# Patient Record
Sex: Female | Born: 1935 | Race: Black or African American | Hispanic: No | Marital: Married | Smoking: Never smoker
Health system: Southern US, Community
[De-identification: ages and names within clinical notes are randomized; demographics above are authoritative.]

## PROBLEM LIST (undated history)

## (undated) DIAGNOSIS — E119 Type 2 diabetes mellitus without complications: Secondary | ICD-10-CM

## (undated) DIAGNOSIS — M810 Age-related osteoporosis without current pathological fracture: Secondary | ICD-10-CM

## (undated) DIAGNOSIS — G709 Myoneural disorder, unspecified: Secondary | ICD-10-CM

## (undated) DIAGNOSIS — K219 Gastro-esophageal reflux disease without esophagitis: Secondary | ICD-10-CM

## (undated) DIAGNOSIS — I10 Essential (primary) hypertension: Secondary | ICD-10-CM

## (undated) DIAGNOSIS — H269 Unspecified cataract: Secondary | ICD-10-CM

## (undated) DIAGNOSIS — E7800 Pure hypercholesterolemia, unspecified: Secondary | ICD-10-CM

## (undated) DIAGNOSIS — D649 Anemia, unspecified: Secondary | ICD-10-CM

## (undated) DIAGNOSIS — E039 Hypothyroidism, unspecified: Secondary | ICD-10-CM

## (undated) DIAGNOSIS — M1990 Unspecified osteoarthritis, unspecified site: Secondary | ICD-10-CM

## (undated) HISTORY — DX: Hypothyroidism, unspecified: E03.9

## (undated) HISTORY — DX: Unspecified cataract: H26.9

## (undated) HISTORY — DX: Pure hypercholesterolemia, unspecified: E78.00

## (undated) HISTORY — DX: Unspecified osteoarthritis, unspecified site: M19.90

## (undated) HISTORY — DX: Type 2 diabetes mellitus without complications: E11.9

## (undated) HISTORY — PX: UPPER GASTROINTESTINAL ENDOSCOPY: SHX188

## (undated) HISTORY — PX: COLONOSCOPY: SHX174

## (undated) HISTORY — PX: CYST EXCISION: SHX5701

## (undated) HISTORY — DX: Essential (primary) hypertension: I10

## (undated) HISTORY — DX: Age-related osteoporosis without current pathological fracture: M81.0

## (undated) HISTORY — DX: Anemia, unspecified: D64.9

## (undated) HISTORY — DX: Myoneural disorder, unspecified: G70.9

---

## 1990-01-09 HISTORY — PX: THYROID SURGERY: SHX805

## 2004-06-09 ENCOUNTER — Ambulatory Visit: Payer: Self-pay | Admitting: Internal Medicine

## 2005-08-16 ENCOUNTER — Ambulatory Visit: Payer: Self-pay | Admitting: Internal Medicine

## 2006-06-29 ENCOUNTER — Ambulatory Visit: Payer: Self-pay | Admitting: Gastroenterology

## 2007-01-23 ENCOUNTER — Ambulatory Visit: Payer: Self-pay | Admitting: Internal Medicine

## 2008-01-31 ENCOUNTER — Ambulatory Visit: Payer: Self-pay | Admitting: Internal Medicine

## 2008-06-25 ENCOUNTER — Ambulatory Visit: Payer: Self-pay | Admitting: Gastroenterology

## 2009-04-12 ENCOUNTER — Ambulatory Visit: Payer: Self-pay | Admitting: Internal Medicine

## 2010-05-10 ENCOUNTER — Ambulatory Visit: Payer: Self-pay | Admitting: Internal Medicine

## 2010-12-05 ENCOUNTER — Ambulatory Visit: Payer: Self-pay | Admitting: Internal Medicine

## 2011-02-03 DIAGNOSIS — E042 Nontoxic multinodular goiter: Secondary | ICD-10-CM | POA: Diagnosis not present

## 2011-02-28 DIAGNOSIS — E041 Nontoxic single thyroid nodule: Secondary | ICD-10-CM | POA: Diagnosis not present

## 2011-03-21 DIAGNOSIS — E119 Type 2 diabetes mellitus without complications: Secondary | ICD-10-CM | POA: Diagnosis not present

## 2011-03-21 DIAGNOSIS — J069 Acute upper respiratory infection, unspecified: Secondary | ICD-10-CM | POA: Diagnosis not present

## 2011-03-21 DIAGNOSIS — E039 Hypothyroidism, unspecified: Secondary | ICD-10-CM | POA: Diagnosis not present

## 2011-03-21 DIAGNOSIS — R05 Cough: Secondary | ICD-10-CM | POA: Diagnosis not present

## 2011-03-21 DIAGNOSIS — E7800 Pure hypercholesterolemia, unspecified: Secondary | ICD-10-CM | POA: Diagnosis not present

## 2011-03-21 DIAGNOSIS — I10 Essential (primary) hypertension: Secondary | ICD-10-CM | POA: Diagnosis not present

## 2011-03-21 DIAGNOSIS — D649 Anemia, unspecified: Secondary | ICD-10-CM | POA: Diagnosis not present

## 2011-03-29 DIAGNOSIS — J209 Acute bronchitis, unspecified: Secondary | ICD-10-CM | POA: Diagnosis not present

## 2011-05-02 DIAGNOSIS — M79609 Pain in unspecified limb: Secondary | ICD-10-CM | POA: Diagnosis not present

## 2011-05-02 DIAGNOSIS — R209 Unspecified disturbances of skin sensation: Secondary | ICD-10-CM | POA: Diagnosis not present

## 2011-05-02 DIAGNOSIS — M25519 Pain in unspecified shoulder: Secondary | ICD-10-CM | POA: Diagnosis not present

## 2011-05-02 DIAGNOSIS — E119 Type 2 diabetes mellitus without complications: Secondary | ICD-10-CM | POA: Diagnosis not present

## 2011-05-02 DIAGNOSIS — I10 Essential (primary) hypertension: Secondary | ICD-10-CM | POA: Diagnosis not present

## 2011-05-02 DIAGNOSIS — E7800 Pure hypercholesterolemia, unspecified: Secondary | ICD-10-CM | POA: Diagnosis not present

## 2011-05-03 DIAGNOSIS — J301 Allergic rhinitis due to pollen: Secondary | ICD-10-CM | POA: Diagnosis not present

## 2011-05-03 DIAGNOSIS — R439 Unspecified disturbances of smell and taste: Secondary | ICD-10-CM | POA: Diagnosis not present

## 2011-05-24 DIAGNOSIS — R439 Unspecified disturbances of smell and taste: Secondary | ICD-10-CM | POA: Diagnosis not present

## 2011-08-29 ENCOUNTER — Ambulatory Visit: Payer: Self-pay | Admitting: Internal Medicine

## 2011-08-29 DIAGNOSIS — E042 Nontoxic multinodular goiter: Secondary | ICD-10-CM | POA: Diagnosis not present

## 2011-08-29 DIAGNOSIS — E041 Nontoxic single thyroid nodule: Secondary | ICD-10-CM | POA: Diagnosis not present

## 2011-09-05 DIAGNOSIS — E042 Nontoxic multinodular goiter: Secondary | ICD-10-CM | POA: Diagnosis not present

## 2011-12-21 DIAGNOSIS — E119 Type 2 diabetes mellitus without complications: Secondary | ICD-10-CM | POA: Diagnosis not present

## 2011-12-21 LAB — HM DIABETES EYE EXAM

## 2011-12-22 ENCOUNTER — Telehealth: Payer: Self-pay | Admitting: *Deleted

## 2011-12-22 MED ORDER — LISINOPRIL 10 MG PO TABS: 10.0000 mg | ORAL_TABLET | Freq: Every day | ORAL | Status: DC

## 2011-12-22 NOTE — Telephone Encounter (Signed)
Called prescription in to pharmacy 

## 2011-12-22 NOTE — Telephone Encounter (Signed)
Patient called requesting lisinopril 10 mg 1 tablet once a day. She only has two tablets left

## 2011-12-27 ENCOUNTER — Telehealth: Payer: Self-pay | Admitting: Internal Medicine

## 2011-12-27 NOTE — Telephone Encounter (Signed)
Call patient to have her call pharmacy they want diagnosis and other information for refill. I called patient to have her call pharmacy to send [place] over a refill request.

## 2011-12-27 NOTE — Telephone Encounter (Signed)
Cell# [phone] Pt came in today she has appointment Monday with dr scott Pt stated she is out of the following meds  accu check test strips [facility] [address]

## 2012-01-01 ENCOUNTER — Encounter: Payer: Self-pay | Admitting: Internal Medicine

## 2012-01-01 ENCOUNTER — Ambulatory Visit (INDEPENDENT_AMBULATORY_CARE_PROVIDER_SITE_OTHER): Payer: Medicare Other | Admitting: Internal Medicine

## 2012-01-01 ENCOUNTER — Other Ambulatory Visit (HOSPITAL_COMMUNITY)
Admission: RE | Admit: 2012-01-01 | Discharge: 2012-01-01 | Disposition: A | Discharge status: Home / Self Care | Payer: Medicare Other | Source: Ambulatory Visit | Attending: Internal Medicine | Admitting: Internal Medicine

## 2012-01-01 VITALS — BP 120/80 | HR 84 | Temp 98.6°F | Resp 16 | Wt 140.5 lb

## 2012-01-01 DIAGNOSIS — Z124 Encounter for screening for malignant neoplasm of cervix: Secondary | ICD-10-CM | POA: Diagnosis not present

## 2012-01-01 DIAGNOSIS — E039 Hypothyroidism, unspecified: Secondary | ICD-10-CM | POA: Insufficient documentation

## 2012-01-01 DIAGNOSIS — E119 Type 2 diabetes mellitus without complications: Secondary | ICD-10-CM

## 2012-01-01 DIAGNOSIS — E1165 Type 2 diabetes mellitus with hyperglycemia: Secondary | ICD-10-CM | POA: Insufficient documentation

## 2012-01-01 DIAGNOSIS — D649 Anemia, unspecified: Secondary | ICD-10-CM

## 2012-01-01 DIAGNOSIS — E7800 Pure hypercholesterolemia, unspecified: Secondary | ICD-10-CM | POA: Diagnosis not present

## 2012-01-01 DIAGNOSIS — Z139 Encounter for screening, unspecified: Secondary | ICD-10-CM

## 2012-01-01 DIAGNOSIS — I10 Essential (primary) hypertension: Secondary | ICD-10-CM | POA: Insufficient documentation

## 2012-01-01 DIAGNOSIS — M810 Age-related osteoporosis without current pathological fracture: Secondary | ICD-10-CM

## 2012-01-01 DIAGNOSIS — M8580 Other specified disorders of bone density and structure, unspecified site: Secondary | ICD-10-CM | POA: Insufficient documentation

## 2012-01-01 DIAGNOSIS — Z1151 Encounter for screening for human papillomavirus (HPV): Secondary | ICD-10-CM | POA: Insufficient documentation

## 2012-01-01 MED ORDER — LISINOPRIL 10 MG PO TABS: 10.0000 mg | ORAL_TABLET | Freq: Every day | ORAL | Status: DC

## 2012-01-01 MED ORDER — PRAVASTATIN SODIUM 40 MG PO TABS: 40.0000 mg | ORAL_TABLET | Freq: Every day | ORAL | Status: DC

## 2012-01-01 MED ORDER — BLOOD GLUCOSE TEST VI STRP: ORAL_STRIP | Status: DC

## 2012-01-01 NOTE — Telephone Encounter (Signed)
Patient was in office today and medication issue was taken care of.

## 2012-01-02 ENCOUNTER — Other Ambulatory Visit (INDEPENDENT_AMBULATORY_CARE_PROVIDER_SITE_OTHER): Payer: BC Managed Care – PPO

## 2012-01-02 ENCOUNTER — Telehealth: Payer: Self-pay | Admitting: Internal Medicine

## 2012-01-02 DIAGNOSIS — E7800 Pure hypercholesterolemia, unspecified: Secondary | ICD-10-CM

## 2012-01-02 DIAGNOSIS — M810 Age-related osteoporosis without current pathological fracture: Secondary | ICD-10-CM | POA: Diagnosis not present

## 2012-01-02 DIAGNOSIS — D649 Anemia, unspecified: Secondary | ICD-10-CM | POA: Diagnosis not present

## 2012-01-02 DIAGNOSIS — E119 Type 2 diabetes mellitus without complications: Secondary | ICD-10-CM | POA: Diagnosis not present

## 2012-01-02 DIAGNOSIS — Z139 Encounter for screening, unspecified: Secondary | ICD-10-CM

## 2012-01-02 LAB — BASIC METABOLIC PANEL
BUN: 15 mg/dL (ref 6–23)
Calcium: 9.2 mg/dL (ref 8.4–10.5)
Creatinine, Ser: 0.8 mg/dL (ref 0.4–1.2)

## 2012-01-02 LAB — CBC WITH DIFFERENTIAL/PLATELET
Basophils Relative: 1.2 % (ref 0.0–3.0)
Eosinophils Relative: 1.4 % (ref 0.0–5.0)
HCT: 34 % — ABNORMAL LOW (ref 36.0–46.0)
Hemoglobin: 10.8 g/dL — ABNORMAL LOW (ref 12.0–15.0)
Lymphs Abs: 1 10*3/uL (ref 0.7–4.0)
MCV: 78.5 fl (ref 78.0–100.0)
Monocytes Absolute: 0.7 10*3/uL (ref 0.1–1.0)
Neutro Abs: 4 10*3/uL (ref 1.4–7.7)
Platelets: 239 10*3/uL (ref 150.0–400.0)
WBC: 5.9 10*3/uL (ref 4.5–10.5)

## 2012-01-02 LAB — HEPATIC FUNCTION PANEL
ALT: 14 U/L (ref 0–35)
AST: 15 U/L (ref 0–37)
Albumin: 4 g/dL (ref 3.5–5.2)
Total Protein: 7.3 g/dL (ref 6.0–8.3)

## 2012-01-02 LAB — HEMOGLOBIN A1C: Hgb A1c MFr Bld: 6.7 % — ABNORMAL HIGH (ref 4.6–6.5)

## 2012-01-02 LAB — MICROALBUMIN / CREATININE URINE RATIO
Creatinine,U: 111 mg/dL
Microalb Creat Ratio: 1.4 mg/g (ref 0.0–30.0)

## 2012-01-02 LAB — FERRITIN: Ferritin: 146.7 ng/mL (ref 10.0–291.0)

## 2012-01-02 LAB — LIPID PANEL
Cholesterol: 177 mg/dL (ref 0–200)
Triglycerides: 59 mg/dL (ref 0.0–149.0)

## 2012-01-02 MED ORDER — GLIPIZIDE ER 5 MG PO TB24: 5.0000 mg | ORAL_TABLET | Freq: Every day | ORAL | Status: DC

## 2012-01-02 NOTE — Telephone Encounter (Signed)
Refill on Glipizide 5 mg. [facility]

## 2012-01-03 LAB — VITAMIN D 25 HYDROXY (VIT D DEFICIENCY, FRACTURES): Vit D, 25-Hydroxy: 37 ng/mL (ref 30–89)

## 2012-01-06 ENCOUNTER — Encounter: Payer: Self-pay | Admitting: Internal Medicine

## 2012-01-06 NOTE — Assessment & Plan Note (Signed)
Blood pressure under good control.  Same medication regimen.  Check metabolic panel.    

## 2012-01-06 NOTE — Assessment & Plan Note (Signed)
Bone density 12/27/09 normal.  Off Fosamax.  Continue calcium and vitamin D.

## 2012-01-06 NOTE — Progress Notes (Signed)
  Subjective:    [name], female    DOB: December 03, 1935, 76 y.o.   MRN: [id number]  HPI 76 year old female with past history diabetes, hypercholesterolemia, osteoporosis, hypertension and hypothyroidism who comes in today to follow up on these issues as well as for a complete physical exam.  She states she has been doing well.  She has seen Dr Renae Fickle.  Off thyroid medication now.  Brought in no recorded sugar readings.  States overall sugars under reasonable control.  No cardiac symptoms with increased activity or exertion.  Breathing stable.  No bowel change.    Past Medical History  Diagnosis Date  . Diabetes mellitus   . Hypothyroidism   . Hypercholesterolemia   . Osteoporosis   . Anemia   . Hypertension     Current Outpatient Prescriptions on File Prior to Visit  Medication Sig Dispense Refill  . calcium-vitamin D (CALCIUM 500+D) 500-200 MG-UNIT per tablet Take 1 tablet by mouth daily.      Marland Kitchen levothyroxine (SYNTHROID) 25 MCG tablet Take 25 mcg by mouth daily.      Marland Kitchen lisinopril (PRINIVIL,ZESTRIL) 10 MG tablet Take 1 tablet (10 mg total) by mouth daily.  90 tablet  3  . metFORMIN (GLUCOPHAGE) 500 MG tablet Two tablets bid      . omeprazole (PRILOSEC) 20 MG capsule Take 20 mg by mouth daily.      . pravastatin (PRAVACHOL) 40 MG tablet Take 1 tablet (40 mg total) by mouth daily.  90 tablet  3  . glipiZIDE (GLUCOTROL XL) 5 MG 24 hr tablet Take 1 tablet (5 mg total) by mouth daily.  90 tablet  1    Review of Systems Patient denies any headache, lightheadedness or dizziness.  No sinus or allergy symptoms.  No chest pain, tightness or palpitations.  No increased shortness of breath, cough or congestion.  No nausea or vomiting.  No abdominal pain or cramping.  No bowel change, such as diarrhea, constipation, BRBPR or melana.  No urine change.        Objective:   Physical Exam Filed Vitals:   01/01/12 1337  BP: 120/80  Pulse: 84  Temp: 98.6 F (37 C)  Resp: 16   Blood  pressure recheck:  59/20  76 year old female in no acute distress.   HEENT:  Nares- clear.  Oropharynx - without lesions. NECK:  Supple.  Nontender.  No audible bruit.  HEART:  Appears to be regular. LUNGS:  No crackles or wheezing audible.  Respirations even and unlabored.  RADIAL PULSE:  Equal bilaterally.    BREASTS:  No nipple discharge or nipple retraction present.  Could not appreciate any distinct nodules or axillary adenopathy.  ABDOMEN:  Soft, nontender.  Bowel sounds present and normal.  No audible abdominal bruit.  GU:  Normal external genitalia.  Vaginal vault without lesions.  Cervix identified.  Pap performed. Could not appreciate any adnexal masses or tenderness.   RECTAL:  Heme negative.   EXTREMITIES:  No increased edema present.  DP pulses palpable and equal bilaterally.           Assessment & Plan:  LOSS OF SMELL/TASTE.  Did not improve with treating infection.  Was referred to ENT.  Feels stable.  Desires no further w/up at this time.    HEALTH MAINTENANCE.  Physical today.  Colonoscopy 06/25/08 with two polyps and a lipoma.  Bone density 12/27/09 normal.  Schedule mammogram.  IFOB - today.

## 2012-01-06 NOTE — Assessment & Plan Note (Signed)
On Pravastatin.  Low cholesterol diet and exercise.  Check lipid panel and liver function.  

## 2012-01-06 NOTE — Assessment & Plan Note (Signed)
Iron deficient.  Hgb has been stable.  Colonoscopy 06/25/08 with two polyps and a lipoma.  EGD 2004.  Treated for H. Pylori.  Upper symptoms controlled.  Ferritin wnl.  Recheck cbc/ferritin.

## 2012-01-06 NOTE — Assessment & Plan Note (Addendum)
Continue diabetic diet and exercise.  Same medications.  No lows.  Follow.  Check blood sugars bid.  Check urine microalbumin/cr ration.

## 2012-01-06 NOTE — Assessment & Plan Note (Signed)
Seeing Dr Renae Fickle.  Off thyroid medication now.  Continues follow up with Dr Renae Fickle.

## 2012-01-07 ENCOUNTER — Telehealth: Payer: Self-pay | Admitting: Internal Medicine

## 2012-01-07 DIAGNOSIS — D649 Anemia, unspecified: Secondary | ICD-10-CM

## 2012-01-07 NOTE — Telephone Encounter (Signed)
Pt notified of labs and need for follow up hgb to confirm stable.  Will order labs.  Also pt agreeable for referral to GI for evaluation of anemia (persistent/slight worsening).  Will return IFOB tomorrow.  Will work on low cholesterol diet and exercise.  Continue pravastatin at current dose.  Will follow.  If persistent elevation, will increase medication.

## 2012-01-08 ENCOUNTER — Other Ambulatory Visit: Payer: BC Managed Care – PPO

## 2012-01-08 ENCOUNTER — Other Ambulatory Visit (INDEPENDENT_AMBULATORY_CARE_PROVIDER_SITE_OTHER): Payer: BC Managed Care – PPO

## 2012-01-08 DIAGNOSIS — D649 Anemia, unspecified: Secondary | ICD-10-CM

## 2012-01-08 LAB — IBC PANEL: Saturation Ratios: 22.7 % (ref 20.0–50.0)

## 2012-01-09 LAB — CBC WITH DIFFERENTIAL/PLATELET
Basophils Relative: 0.8 % (ref 0.0–3.0)
Eosinophils Relative: 1.9 % (ref 0.0–5.0)
Hemoglobin: 10.3 g/dL — ABNORMAL LOW (ref 12.0–15.0)
Lymphocytes Relative: 30.7 % (ref 12.0–46.0)
Monocytes Relative: 4.9 % (ref 3.0–12.0)
Neutro Abs: 3.6 10*3/uL (ref 1.4–7.7)
RBC: 4.15 Mil/uL (ref 3.87–5.11)

## 2012-01-10 ENCOUNTER — Other Ambulatory Visit: Payer: Self-pay | Admitting: Internal Medicine

## 2012-01-10 DIAGNOSIS — D649 Anemia, unspecified: Secondary | ICD-10-CM

## 2012-01-10 NOTE — Progress Notes (Signed)
Order placed for follow up labs 

## 2012-02-01 ENCOUNTER — Ambulatory Visit: Payer: Self-pay | Admitting: Internal Medicine

## 2012-02-01 DIAGNOSIS — R928 Other abnormal and inconclusive findings on diagnostic imaging of breast: Secondary | ICD-10-CM | POA: Diagnosis not present

## 2012-02-01 DIAGNOSIS — Z1231 Encounter for screening mammogram for malignant neoplasm of breast: Secondary | ICD-10-CM | POA: Diagnosis not present

## 2012-02-08 ENCOUNTER — Other Ambulatory Visit: Payer: Medicare Other

## 2012-02-08 ENCOUNTER — Other Ambulatory Visit (INDEPENDENT_AMBULATORY_CARE_PROVIDER_SITE_OTHER): Payer: Medicare Other

## 2012-02-08 DIAGNOSIS — D649 Anemia, unspecified: Secondary | ICD-10-CM | POA: Diagnosis not present

## 2012-02-08 LAB — CBC WITH DIFFERENTIAL/PLATELET
Eosinophils Relative: 2.1 % (ref 0.0–5.0)
HCT: 33.5 % — ABNORMAL LOW (ref 36.0–46.0)
Hemoglobin: 10.6 g/dL — ABNORMAL LOW (ref 12.0–15.0)
Lymphs Abs: 1.9 10*3/uL (ref 0.7–4.0)
Monocytes Relative: 6.8 % (ref 3.0–12.0)
Neutro Abs: 4.3 10*3/uL (ref 1.4–7.7)
Platelets: 220 10*3/uL (ref 150.0–400.0)
RBC: 4.29 Mil/uL (ref 3.87–5.11)
WBC: 6.8 10*3/uL (ref 4.5–10.5)

## 2012-02-09 ENCOUNTER — Other Ambulatory Visit: Payer: Self-pay | Admitting: Internal Medicine

## 2012-02-09 DIAGNOSIS — I10 Essential (primary) hypertension: Secondary | ICD-10-CM

## 2012-02-09 DIAGNOSIS — D649 Anemia, unspecified: Secondary | ICD-10-CM

## 2012-02-09 DIAGNOSIS — E7800 Pure hypercholesterolemia, unspecified: Secondary | ICD-10-CM

## 2012-02-09 DIAGNOSIS — E119 Type 2 diabetes mellitus without complications: Secondary | ICD-10-CM

## 2012-02-09 NOTE — Progress Notes (Signed)
Orders placed for follow up labs 

## 2012-02-13 ENCOUNTER — Encounter: Payer: Self-pay | Admitting: *Deleted

## 2012-02-14 ENCOUNTER — Encounter: Payer: Self-pay | Admitting: Internal Medicine

## 2012-02-26 ENCOUNTER — Other Ambulatory Visit: Payer: Self-pay | Admitting: *Deleted

## 2012-02-27 ENCOUNTER — Telehealth: Payer: Self-pay | Admitting: Internal Medicine

## 2012-02-27 MED ORDER — METFORMIN HCL 500 MG PO TABS: ORAL_TABLET | ORAL | Status: DC

## 2012-02-27 NOTE — Telephone Encounter (Signed)
Sent in to pharmacy.  

## 2012-02-27 NOTE — Telephone Encounter (Signed)
Pt is calling and needing her Lisinopril tabs 10 mg, Pravastatin 40 mg and Metformin 500 mg.  Pt uses [facility] and they are saying the Dr. Laury Axon to call in with this reference number per Pt it is [phone]

## 2012-02-29 MED ORDER — LISINOPRIL 10 MG PO TABS: 10.0000 mg | ORAL_TABLET | Freq: Every day | ORAL | Status: DC

## 2012-02-29 MED ORDER — METFORMIN HCL 500 MG PO TABS: ORAL_TABLET | ORAL | Status: DC

## 2012-02-29 MED ORDER — PRAVASTATIN SODIUM 40 MG PO TABS: 40.0000 mg | ORAL_TABLET | Freq: Every day | ORAL | Status: DC

## 2012-02-29 NOTE — Telephone Encounter (Signed)
Sent in to pharmacy.  

## 2012-03-05 ENCOUNTER — Ambulatory Visit: Payer: Self-pay | Admitting: Internal Medicine

## 2012-03-05 DIAGNOSIS — E042 Nontoxic multinodular goiter: Secondary | ICD-10-CM | POA: Diagnosis not present

## 2012-03-08 DIAGNOSIS — E042 Nontoxic multinodular goiter: Secondary | ICD-10-CM | POA: Diagnosis not present

## 2012-03-12 DIAGNOSIS — E042 Nontoxic multinodular goiter: Secondary | ICD-10-CM | POA: Diagnosis not present

## 2012-03-20 ENCOUNTER — Other Ambulatory Visit: Payer: Self-pay | Admitting: *Deleted

## 2012-03-20 ENCOUNTER — Other Ambulatory Visit (INDEPENDENT_AMBULATORY_CARE_PROVIDER_SITE_OTHER): Payer: Medicare Other

## 2012-03-20 DIAGNOSIS — Z1211 Encounter for screening for malignant neoplasm of colon: Secondary | ICD-10-CM

## 2012-03-22 ENCOUNTER — Encounter: Payer: Self-pay | Admitting: Internal Medicine

## 2012-04-09 DIAGNOSIS — D649 Anemia, unspecified: Secondary | ICD-10-CM | POA: Diagnosis not present

## 2012-04-11 ENCOUNTER — Telehealth: Payer: Self-pay | Admitting: Internal Medicine

## 2012-04-11 NOTE — Telephone Encounter (Signed)
Patient needing a refill on her omeprazole (PRILOSEC) 20 MG capsule . Send to [facility].

## 2012-04-12 MED ORDER — OMEPRAZOLE 20 MG PO CPDR: 20.0000 mg | DELAYED_RELEASE_CAPSULE | Freq: Every day | ORAL | Status: DC

## 2012-04-12 NOTE — Telephone Encounter (Signed)
Rx sent in to pharmacy. 

## 2012-04-22 ENCOUNTER — Ambulatory Visit: Payer: Self-pay | Admitting: Gastroenterology

## 2012-04-22 DIAGNOSIS — Q438 Other specified congenital malformations of intestine: Secondary | ICD-10-CM | POA: Diagnosis not present

## 2012-04-22 DIAGNOSIS — E119 Type 2 diabetes mellitus without complications: Secondary | ICD-10-CM | POA: Diagnosis not present

## 2012-04-22 DIAGNOSIS — K2940 Chronic atrophic gastritis without bleeding: Secondary | ICD-10-CM | POA: Diagnosis not present

## 2012-04-22 DIAGNOSIS — K648 Other hemorrhoids: Secondary | ICD-10-CM | POA: Diagnosis not present

## 2012-04-22 DIAGNOSIS — E890 Postprocedural hypothyroidism: Secondary | ICD-10-CM | POA: Diagnosis not present

## 2012-04-22 DIAGNOSIS — Z79899 Other long term (current) drug therapy: Secondary | ICD-10-CM | POA: Diagnosis not present

## 2012-04-22 DIAGNOSIS — I10 Essential (primary) hypertension: Secondary | ICD-10-CM | POA: Diagnosis not present

## 2012-04-22 DIAGNOSIS — E7800 Pure hypercholesterolemia, unspecified: Secondary | ICD-10-CM | POA: Diagnosis not present

## 2012-04-22 DIAGNOSIS — K2970 Gastritis, unspecified, without bleeding: Secondary | ICD-10-CM | POA: Diagnosis not present

## 2012-04-22 DIAGNOSIS — D509 Iron deficiency anemia, unspecified: Secondary | ICD-10-CM | POA: Diagnosis not present

## 2012-04-23 LAB — PATHOLOGY REPORT

## 2012-04-30 ENCOUNTER — Encounter: Payer: Self-pay | Admitting: Internal Medicine

## 2012-04-30 DIAGNOSIS — D649 Anemia, unspecified: Secondary | ICD-10-CM

## 2012-05-03 ENCOUNTER — Other Ambulatory Visit (INDEPENDENT_AMBULATORY_CARE_PROVIDER_SITE_OTHER): Payer: Medicare Other

## 2012-05-03 DIAGNOSIS — E119 Type 2 diabetes mellitus without complications: Secondary | ICD-10-CM

## 2012-05-03 DIAGNOSIS — I10 Essential (primary) hypertension: Secondary | ICD-10-CM

## 2012-05-03 DIAGNOSIS — E7800 Pure hypercholesterolemia, unspecified: Secondary | ICD-10-CM

## 2012-05-03 DIAGNOSIS — D649 Anemia, unspecified: Secondary | ICD-10-CM | POA: Diagnosis not present

## 2012-05-03 LAB — BASIC METABOLIC PANEL
BUN: 18 mg/dL (ref 6–23)
CO2: 26 mEq/L (ref 19–32)
Glucose, Bld: 122 mg/dL — ABNORMAL HIGH (ref 70–99)
Potassium: 4.7 mEq/L (ref 3.5–5.1)
Sodium: 137 mEq/L (ref 135–145)

## 2012-05-03 LAB — HEMOGLOBIN A1C: Hgb A1c MFr Bld: 6.7 % — ABNORMAL HIGH (ref 4.6–6.5)

## 2012-05-03 LAB — CBC WITH DIFFERENTIAL/PLATELET
Basophils Absolute: 0.1 10*3/uL (ref 0.0–0.1)
Eosinophils Absolute: 0.1 10*3/uL (ref 0.0–0.7)
HCT: 35.1 % — ABNORMAL LOW (ref 36.0–46.0)
Hemoglobin: 11.3 g/dL — ABNORMAL LOW (ref 12.0–15.0)
Lymphs Abs: 2.1 10*3/uL (ref 0.7–4.0)
MCHC: 32.2 g/dL (ref 30.0–36.0)
MCV: 78.7 fl (ref 78.0–100.0)
Monocytes Absolute: 0.5 10*3/uL (ref 0.1–1.0)
Monocytes Relative: 6.7 % (ref 3.0–12.0)
Neutro Abs: 5.2 10*3/uL (ref 1.4–7.7)
Platelets: 263 10*3/uL (ref 150.0–400.0)
RDW: 16.3 % — ABNORMAL HIGH (ref 11.5–14.6)

## 2012-05-03 LAB — LIPID PANEL: VLDL: 9.8 mg/dL (ref 0.0–40.0)

## 2012-05-03 LAB — HEPATIC FUNCTION PANEL
ALT: 16 U/L (ref 0–35)
Alkaline Phosphatase: 39 U/L (ref 39–117)
Bilirubin, Direct: 0 mg/dL (ref 0.0–0.3)
Total Bilirubin: 0.3 mg/dL (ref 0.3–1.2)

## 2012-05-06 ENCOUNTER — Ambulatory Visit: Payer: BC Managed Care – PPO | Admitting: Internal Medicine

## 2012-05-07 ENCOUNTER — Ambulatory Visit (INDEPENDENT_AMBULATORY_CARE_PROVIDER_SITE_OTHER): Payer: Medicare Other | Admitting: Internal Medicine

## 2012-05-07 ENCOUNTER — Encounter: Payer: Self-pay | Admitting: Internal Medicine

## 2012-05-07 VITALS — BP 130/80 | HR 77 | Temp 98.3°F | Resp 18 | Wt 140.5 lb

## 2012-05-07 DIAGNOSIS — M810 Age-related osteoporosis without current pathological fracture: Secondary | ICD-10-CM

## 2012-05-07 DIAGNOSIS — E119 Type 2 diabetes mellitus without complications: Secondary | ICD-10-CM | POA: Diagnosis not present

## 2012-05-07 DIAGNOSIS — E7800 Pure hypercholesterolemia, unspecified: Secondary | ICD-10-CM

## 2012-05-07 DIAGNOSIS — I10 Essential (primary) hypertension: Secondary | ICD-10-CM

## 2012-05-07 DIAGNOSIS — E039 Hypothyroidism, unspecified: Secondary | ICD-10-CM

## 2012-05-07 DIAGNOSIS — D649 Anemia, unspecified: Secondary | ICD-10-CM

## 2012-05-12 ENCOUNTER — Encounter: Payer: Self-pay | Admitting: Internal Medicine

## 2012-05-12 NOTE — Assessment & Plan Note (Signed)
Iron deficient.  Hgb has been stable.  Colonoscopy 06/25/08 with two polyps and a lipoma.  EGD 2004.  Treated for H. Pylori.  Upper symptoms controlled.  Ferritin wnl.  Follow cbc/ferritin.  Most recent hgb 11.3.

## 2012-05-12 NOTE — Assessment & Plan Note (Signed)
On Pravastatin.  Low cholesterol diet and exercise.  Follow lipid panel and liver function.  Lipid panel just checked 05/03/12 - total cholesterol 151, triglycerides 149, HDL 46 and LDL 95.

## 2012-05-12 NOTE — Assessment & Plan Note (Signed)
Bone density 12/27/09 normal.  Off Fosamax.  Continue calcium and vitamin D.

## 2012-05-12 NOTE — Progress Notes (Signed)
Subjective:    [name], female    DOB: 21-May-1935, 77 y.o.   MRN: [id number]  HPI 77 year old female with past history diabetes, hypercholesterolemia, osteoporosis, hypertension and hypothyroidism who comes in today for a scheduled follow up.  She states she has been doing well.  She sees Dr Renae Fickle.  Off thyroid medication.   Brought in no recorded sugar readings.  States overall sugars under reasonable control. Last a1c 6.7 (05/03/12).   No cardiac symptoms with increased activity or exertion.  Breathing stable.  No bowel change.    Past Medical History  Diagnosis Date  . Diabetes mellitus   . Hypothyroidism   . Hypercholesterolemia   . Osteoporosis   . Anemia   . Hypertension     Current Outpatient Prescriptions on File Prior to Visit  Medication Sig Dispense Refill  . aspirin 81 MG tablet Take 81 mg by mouth daily.      . calcium-vitamin D (CALCIUM 500+D) 500-200 MG-UNIT per tablet Take 1 tablet by mouth daily.      Marland Kitchen glipiZIDE (GLUCOTROL XL) 5 MG 24 hr tablet Take 1 tablet (5 mg total) by mouth daily.  90 tablet  1  . Glucose Blood (BLOOD GLUCOSE TEST STRIPS) STRP accu chek compact Check blood sugars bid Dx 250.00  180 each  3  . lisinopril (PRINIVIL,ZESTRIL) 10 MG tablet Take 1 tablet (10 mg total) by mouth daily.  90 tablet  1  . metFORMIN (GLUCOPHAGE) 500 MG tablet Two tablets bid  180 tablet  1  . Multiple Vitamin (MULTIVITAMIN) tablet Take 1 tablet by mouth daily.      Marland Kitchen omeprazole (PRILOSEC) 20 MG capsule Take 1 capsule (20 mg total) by mouth daily.  90 capsule  1  . pravastatin (PRAVACHOL) 40 MG tablet Take 1 tablet (40 mg total) by mouth daily.  90 tablet  1  . levothyroxine (SYNTHROID) 25 MCG tablet Take 25 mcg by mouth daily.      . meloxicam (MOBIC) 7.5 MG tablet Take 7.5 mg by mouth daily as needed.       No current facility-administered medications on file prior to visit.    Review of Systems Patient denies any headache, lightheadedness or  dizziness.  No sinus or allergy symptoms.  No chest pain, tightness or palpitations.  No increased shortness of breath, cough or congestion.  No nausea or vomiting.  No abdominal pain or cramping.  No bowel change, such as diarrhea, constipation, BRBPR or melana.  No urine change.   Overall she feels she is doing well.      Objective:   Physical Exam  Filed Vitals:   05/07/12 1135  BP: 130/80  Pulse: 77  Temp: 98.3 F (36.8 C)  Resp: 54   77 year old female in no acute distress.   HEENT:  Nares- clear.  Oropharynx - without lesions. NECK:  Supple.  Nontender.  No audible bruit.  HEART:  Appears to be regular. LUNGS:  No crackles or wheezing audible.  Respirations even and unlabored.  RADIAL PULSE:  Equal bilaterally.  ABDOMEN:  Soft, nontender.  Bowel sounds present and normal.  No audible abdominal bruit.   EXTREMITIES:  No increased edema present.  DP pulses palpable and equal bilaterally.           Assessment & Plan:  LOSS OF SMELL/TASTE.  Did not improve with treating infection.  Was referred to ENT.  Feels stable.  Desires no further w/up at this time.  HEALTH MAINTENANCE.  Physical 01/01/12.  Colonoscopy 06/25/08 with two polyps and a lipoma.  Bone density 12/27/09 normal.  Mammogram 02/01/12 - Birads II.

## 2012-05-12 NOTE — Assessment & Plan Note (Signed)
Seeing Dr Renae Fickle.  Off thyroid medication now.  Continues follow up with Dr Renae Fickle.

## 2012-05-12 NOTE — Assessment & Plan Note (Signed)
Continue diabetic diet and exercise.  Same medications.  No lows.  Follow.  Check blood sugars bid.  A1c just checked 6.7.

## 2012-05-12 NOTE — Assessment & Plan Note (Signed)
Blood pressure under good control.  Same medication regimen.  Follow metabolic panel.   

## 2012-05-17 ENCOUNTER — Encounter: Payer: Self-pay | Admitting: Internal Medicine

## 2012-06-04 DIAGNOSIS — D509 Iron deficiency anemia, unspecified: Secondary | ICD-10-CM | POA: Diagnosis not present

## 2012-06-04 DIAGNOSIS — R195 Other fecal abnormalities: Secondary | ICD-10-CM | POA: Diagnosis not present

## 2012-06-04 DIAGNOSIS — K2940 Chronic atrophic gastritis without bleeding: Secondary | ICD-10-CM | POA: Diagnosis not present

## 2012-06-11 DIAGNOSIS — D509 Iron deficiency anemia, unspecified: Secondary | ICD-10-CM | POA: Diagnosis not present

## 2012-07-10 DIAGNOSIS — D509 Iron deficiency anemia, unspecified: Secondary | ICD-10-CM | POA: Diagnosis not present

## 2012-07-26 ENCOUNTER — Ambulatory Visit: Payer: Self-pay | Admitting: Gastroenterology

## 2012-07-26 DIAGNOSIS — D649 Anemia, unspecified: Secondary | ICD-10-CM | POA: Diagnosis not present

## 2012-07-26 DIAGNOSIS — D509 Iron deficiency anemia, unspecified: Secondary | ICD-10-CM | POA: Diagnosis not present

## 2012-08-01 ENCOUNTER — Other Ambulatory Visit: Payer: Self-pay | Admitting: Internal Medicine

## 2012-08-17 ENCOUNTER — Other Ambulatory Visit: Payer: Self-pay | Admitting: Internal Medicine

## 2012-08-22 ENCOUNTER — Encounter: Payer: Self-pay | Admitting: *Deleted

## 2012-09-02 ENCOUNTER — Other Ambulatory Visit (INDEPENDENT_AMBULATORY_CARE_PROVIDER_SITE_OTHER): Payer: Medicare Other

## 2012-09-02 DIAGNOSIS — E119 Type 2 diabetes mellitus without complications: Secondary | ICD-10-CM | POA: Diagnosis not present

## 2012-09-02 DIAGNOSIS — I10 Essential (primary) hypertension: Secondary | ICD-10-CM

## 2012-09-02 DIAGNOSIS — E7800 Pure hypercholesterolemia, unspecified: Secondary | ICD-10-CM | POA: Diagnosis not present

## 2012-09-02 DIAGNOSIS — D649 Anemia, unspecified: Secondary | ICD-10-CM

## 2012-09-02 LAB — CBC WITH DIFFERENTIAL/PLATELET
Basophils Relative: 0.9 % (ref 0.0–3.0)
Eosinophils Absolute: 0.1 10*3/uL (ref 0.0–0.7)
Eosinophils Relative: 1.2 % (ref 0.0–5.0)
HCT: 33.6 % — ABNORMAL LOW (ref 36.0–46.0)
Lymphs Abs: 2 10*3/uL (ref 0.7–4.0)
MCHC: 32.2 g/dL (ref 30.0–36.0)
MCV: 78.8 fl (ref 78.0–100.0)
Monocytes Absolute: 0.6 10*3/uL (ref 0.1–1.0)
Platelets: 246 10*3/uL (ref 150.0–400.0)
RBC: 4.26 Mil/uL (ref 3.87–5.11)
WBC: 7.6 10*3/uL (ref 4.5–10.5)

## 2012-09-02 LAB — LIPID PANEL
Cholesterol: 142 mg/dL (ref 0–200)
Triglycerides: 53 mg/dL (ref 0.0–149.0)

## 2012-09-02 LAB — IBC PANEL: Transferrin: 226.7 mg/dL (ref 212.0–360.0)

## 2012-09-02 LAB — HEPATIC FUNCTION PANEL
ALT: 11 U/L (ref 0–35)
AST: 13 U/L (ref 0–37)
Albumin: 4.1 g/dL (ref 3.5–5.2)

## 2012-09-02 LAB — BASIC METABOLIC PANEL
CO2: 25 mEq/L (ref 19–32)
Chloride: 107 mEq/L (ref 96–112)
Creatinine, Ser: 0.7 mg/dL (ref 0.4–1.2)
Glucose, Bld: 89 mg/dL (ref 70–99)

## 2012-09-10 ENCOUNTER — Encounter: Payer: Self-pay | Admitting: Internal Medicine

## 2012-09-10 ENCOUNTER — Ambulatory Visit (INDEPENDENT_AMBULATORY_CARE_PROVIDER_SITE_OTHER): Payer: Medicare Other | Admitting: Internal Medicine

## 2012-09-10 VITALS — BP 110/60 | HR 79 | Temp 98.0°F | Ht 63.0 in | Wt 140.8 lb

## 2012-09-10 DIAGNOSIS — L989 Disorder of the skin and subcutaneous tissue, unspecified: Secondary | ICD-10-CM

## 2012-09-10 DIAGNOSIS — D649 Anemia, unspecified: Secondary | ICD-10-CM

## 2012-09-10 DIAGNOSIS — E039 Hypothyroidism, unspecified: Secondary | ICD-10-CM

## 2012-09-10 DIAGNOSIS — I10 Essential (primary) hypertension: Secondary | ICD-10-CM | POA: Diagnosis not present

## 2012-09-10 DIAGNOSIS — E7800 Pure hypercholesterolemia, unspecified: Secondary | ICD-10-CM

## 2012-09-10 DIAGNOSIS — E119 Type 2 diabetes mellitus without complications: Secondary | ICD-10-CM | POA: Diagnosis not present

## 2012-09-10 DIAGNOSIS — M810 Age-related osteoporosis without current pathological fracture: Secondary | ICD-10-CM

## 2012-09-10 NOTE — Progress Notes (Signed)
Subjective:    [name], female    DOB: 11-19-1935, 77 y.o.   MRN: [id number]  HPI 77 year old female with past history diabetes, hypercholesterolemia, osteoporosis, hypertension and hypothyroidism who comes in today for a scheduled follow up.  She states she has been doing well.  She sees Dr Renae Fickle.  Off thyroid medication.   Brought in no recorded sugar readings.  States overall sugars under reasonable control. Last a1c 6.6 (09/02/12).   No cardiac symptoms with increased activity or exertion.  Breathing stable.  No bowel change.     Past Medical History  Diagnosis Date  . Diabetes mellitus   . Hypothyroidism   . Hypercholesterolemia   . Osteoporosis   . Anemia   . Hypertension     Current Outpatient Prescriptions on File Prior to Visit  Medication Sig Dispense Refill  . aspirin 81 MG tablet Take 81 mg by mouth daily.      . calcium-vitamin D (CALCIUM 500+D) 500-200 MG-UNIT per tablet Take 1 tablet by mouth daily.      Marland Kitchen GLIPIZIDE XL 5 MG 24 hr tablet TAKE 1 TABLET DAILY  90 tablet  1  . Glucose Blood (BLOOD GLUCOSE TEST STRIPS) STRP accu chek compact Check blood sugars bid Dx 250.00  180 each  3  . lisinopril (PRINIVIL,ZESTRIL) 10 MG tablet Take 1 tablet (10 mg total) by mouth daily.  90 tablet  1  . meloxicam (MOBIC) 7.5 MG tablet Take 7.5 mg by mouth daily as needed.      . metFORMIN (GLUCOPHAGE) 500 MG tablet Two tablets bid  180 tablet  1  . Multiple Vitamin (MULTIVITAMIN) tablet Take 1 tablet by mouth daily.      Marland Kitchen omeprazole (PRILOSEC) 20 MG capsule Take 1 capsule (20 mg total) by mouth daily.  90 capsule  1  . pravastatin (PRAVACHOL) 40 MG tablet TAKE 1 TABLET DAILY  90 tablet  1   No current facility-administered medications on file prior to visit.    Review of Systems Patient denies any headache, lightheadedness or dizziness.  No sinus or allergy symptoms.  No chest pain, tightness or palpitations.  No increased shortness of breath, cough or  congestion.  No nausea or vomiting.  No abdominal pain or cramping.  No bowel change, such as diarrhea, constipation, BRBPR or melana.  No urine change.   Overall she feels she is doing well.  Had recent GI evaluation - unrevealing.       Objective:   Physical Exam  Filed Vitals:   09/10/12 0953  BP: 110/60  Pulse: 79  Temp: 98 F (36.7 C)   Blood pressure recheck:  112/70, pulse 62  77 year old female in no acute distress.   HEENT:  Nares- clear.  Oropharynx - without lesions. NECK:  Supple.  Nontender.  No audible bruit.  HEART:  Appears to be regular. LUNGS:  No crackles or wheezing audible.  Respirations even and unlabored.  RADIAL PULSE:  Equal bilaterally.  ABDOMEN:  Soft, nontender.  Bowel sounds present and normal.  No audible abdominal bruit.   EXTREMITIES:  No increased edema present.  DP pulses palpable and equal bilaterally.      SKIN:  Right foot lesion.  Skin discoloration.  No other lesions.       Assessment & Plan:  DERMATOLOGY.  Persistent right foot lesion.  Refer to dermatology.   LOSS OF SMELL/TASTE.  Did not improve with treating infection.  Was referred to ENT.  Not reported as an issue today.     HEALTH MAINTENANCE.  Physical 01/01/12.  Colonoscopy 06/25/08 with two polyps and a lipoma.  Follow up colonoscopy 04/22/12 as outlined.   Bone density 12/27/09 normal.  Mammogram 02/01/12 - Birads II.

## 2012-09-11 ENCOUNTER — Encounter: Payer: Self-pay | Admitting: Internal Medicine

## 2012-09-11 NOTE — Assessment & Plan Note (Signed)
Continue diabetic diet and exercise.  Same medications.  No lows.  Follow.  Check blood sugars bid.  A1c just checked 6.6

## 2012-09-11 NOTE — Assessment & Plan Note (Signed)
Blood pressure under good control.  Same medication regimen.  Follow metabolic panel.   

## 2012-09-11 NOTE — Assessment & Plan Note (Addendum)
On Pravastatin.  Low cholesterol diet and exercise.  Follow lipid panel and liver function.  Lipid panel just checked 09/02/12 - total cholesterol 142, triglycerides 53, HDL 43 and LDL 89.

## 2012-09-11 NOTE — Assessment & Plan Note (Signed)
Bone density 12/27/09 normal.  Off Fosamax.  Continue calcium and vitamin D.

## 2012-09-11 NOTE — Assessment & Plan Note (Signed)
GI w/up as outlined.  hgb stable.  Follow.

## 2012-09-11 NOTE — Assessment & Plan Note (Signed)
Seeing Dr Renae Fickle.  Off thyroid medication now.  Continues follow up with Dr Renae Fickle.

## 2012-09-12 ENCOUNTER — Encounter: Payer: Self-pay | Admitting: Emergency Medicine

## 2012-09-16 ENCOUNTER — Other Ambulatory Visit: Payer: Self-pay | Admitting: Internal Medicine

## 2012-09-30 ENCOUNTER — Encounter: Payer: Self-pay | Admitting: Internal Medicine

## 2012-10-17 DIAGNOSIS — I8390 Asymptomatic varicose veins of unspecified lower extremity: Secondary | ICD-10-CM | POA: Diagnosis not present

## 2012-10-17 DIAGNOSIS — D485 Neoplasm of uncertain behavior of skin: Secondary | ICD-10-CM | POA: Diagnosis not present

## 2012-10-17 DIAGNOSIS — R209 Unspecified disturbances of skin sensation: Secondary | ICD-10-CM | POA: Diagnosis not present

## 2012-10-17 DIAGNOSIS — L723 Sebaceous cyst: Secondary | ICD-10-CM | POA: Diagnosis not present

## 2012-10-24 DIAGNOSIS — I83893 Varicose veins of bilateral lower extremities with other complications: Secondary | ICD-10-CM | POA: Diagnosis not present

## 2012-10-24 DIAGNOSIS — R234 Changes in skin texture: Secondary | ICD-10-CM | POA: Diagnosis not present

## 2012-10-24 DIAGNOSIS — L723 Sebaceous cyst: Secondary | ICD-10-CM | POA: Diagnosis not present

## 2012-12-27 ENCOUNTER — Other Ambulatory Visit: Payer: Self-pay | Admitting: Internal Medicine

## 2013-01-20 ENCOUNTER — Other Ambulatory Visit (INDEPENDENT_AMBULATORY_CARE_PROVIDER_SITE_OTHER): Payer: Medicare Other

## 2013-01-20 DIAGNOSIS — D649 Anemia, unspecified: Secondary | ICD-10-CM | POA: Diagnosis not present

## 2013-01-20 DIAGNOSIS — E119 Type 2 diabetes mellitus without complications: Secondary | ICD-10-CM | POA: Diagnosis not present

## 2013-01-20 DIAGNOSIS — E039 Hypothyroidism, unspecified: Secondary | ICD-10-CM

## 2013-01-20 DIAGNOSIS — M810 Age-related osteoporosis without current pathological fracture: Secondary | ICD-10-CM | POA: Diagnosis not present

## 2013-01-20 DIAGNOSIS — E7800 Pure hypercholesterolemia, unspecified: Secondary | ICD-10-CM

## 2013-01-20 LAB — CBC WITH DIFFERENTIAL/PLATELET
BASOS ABS: 0.1 10*3/uL (ref 0.0–0.1)
Basophils Relative: 0.7 % (ref 0.0–3.0)
EOS ABS: 0.2 10*3/uL (ref 0.0–0.7)
Eosinophils Relative: 2 % (ref 0.0–5.0)
HCT: 32.5 % — ABNORMAL LOW (ref 36.0–46.0)
HEMOGLOBIN: 10.4 g/dL — AB (ref 12.0–15.0)
LYMPHS ABS: 1.5 10*3/uL (ref 0.7–4.0)
LYMPHS PCT: 19.6 % (ref 12.0–46.0)
MCHC: 32 g/dL (ref 30.0–36.0)
MCV: 78.1 fl (ref 78.0–100.0)
MONO ABS: 0.6 10*3/uL (ref 0.1–1.0)
Monocytes Relative: 8.4 % (ref 3.0–12.0)
NEUTROS ABS: 5.3 10*3/uL (ref 1.4–7.7)
Neutrophils Relative %: 69.3 % (ref 43.0–77.0)
Platelets: 251 10*3/uL (ref 150.0–400.0)
RBC: 4.17 Mil/uL (ref 3.87–5.11)
RDW: 16.3 % — AB (ref 11.5–14.6)
WBC: 7.6 10*3/uL (ref 4.5–10.5)

## 2013-01-20 LAB — BASIC METABOLIC PANEL
BUN: 16 mg/dL (ref 6–23)
CALCIUM: 9.5 mg/dL (ref 8.4–10.5)
CO2: 24 meq/L (ref 19–32)
Chloride: 109 mEq/L (ref 96–112)
Creatinine, Ser: 0.8 mg/dL (ref 0.4–1.2)
GFR: 76.36 mL/min (ref >60.00)
GLUCOSE: 94 mg/dL (ref 70–99)
Potassium: 4.8 mEq/L (ref 3.5–5.1)
SODIUM: 143 meq/L (ref 135–145)

## 2013-01-20 LAB — HEPATIC FUNCTION PANEL
ALBUMIN: 4 g/dL (ref 3.5–5.2)
ALT: 14 U/L (ref 0–35)
AST: 15 U/L (ref 0–37)
Alkaline Phosphatase: 38 U/L — ABNORMAL LOW (ref 39–117)
BILIRUBIN TOTAL: 0.4 mg/dL (ref 0.3–1.2)
Bilirubin, Direct: 0.1 mg/dL (ref 0.0–0.3)
Total Protein: 7 g/dL (ref 6.0–8.3)

## 2013-01-20 LAB — LIPID PANEL
CHOL/HDL RATIO: 3
Cholesterol: 153 mg/dL (ref 0–200)
HDL: 52 mg/dL (ref >39.00)
LDL Cholesterol: 93 mg/dL (ref 0–99)
TRIGLYCERIDES: 39 mg/dL (ref 0.0–149.0)
VLDL: 7.8 mg/dL (ref 0.0–40.0)

## 2013-01-20 LAB — MICROALBUMIN / CREATININE URINE RATIO
Creatinine,U: 103.6 mg/dL
Microalb Creat Ratio: 1.5 mg/g (ref 0.0–30.0)
Microalb, Ur: 1.6 mg/dL (ref 0.0–1.9)

## 2013-01-20 LAB — FERRITIN: Ferritin: 106.5 ng/mL (ref 10.0–291.0)

## 2013-01-20 LAB — TSH: TSH: 0.86 u[IU]/mL (ref 0.35–5.50)

## 2013-01-20 LAB — HEMOGLOBIN A1C: Hgb A1c MFr Bld: 6.7 % — ABNORMAL HIGH (ref 4.6–6.5)

## 2013-01-21 LAB — VITAMIN D 25 HYDROXY (VIT D DEFICIENCY, FRACTURES): Vit D, 25-Hydroxy: 39 ng/mL (ref 30–89)

## 2013-01-23 ENCOUNTER — Ambulatory Visit (INDEPENDENT_AMBULATORY_CARE_PROVIDER_SITE_OTHER): Payer: Medicare Other | Admitting: Internal Medicine

## 2013-01-23 ENCOUNTER — Encounter: Payer: Self-pay | Admitting: Internal Medicine

## 2013-01-23 ENCOUNTER — Other Ambulatory Visit: Payer: Self-pay | Admitting: Internal Medicine

## 2013-01-23 VITALS — BP 110/60 | HR 80 | Temp 98.0°F | Ht 64.0 in | Wt 143.5 lb

## 2013-01-23 DIAGNOSIS — D649 Anemia, unspecified: Secondary | ICD-10-CM | POA: Diagnosis not present

## 2013-01-23 DIAGNOSIS — M810 Age-related osteoporosis without current pathological fracture: Secondary | ICD-10-CM

## 2013-01-23 DIAGNOSIS — Z1239 Encounter for other screening for malignant neoplasm of breast: Secondary | ICD-10-CM | POA: Diagnosis not present

## 2013-01-23 DIAGNOSIS — K5090 Crohn's disease, unspecified, without complications: Secondary | ICD-10-CM

## 2013-01-23 DIAGNOSIS — I10 Essential (primary) hypertension: Secondary | ICD-10-CM

## 2013-01-23 DIAGNOSIS — E119 Type 2 diabetes mellitus without complications: Secondary | ICD-10-CM | POA: Diagnosis not present

## 2013-01-23 DIAGNOSIS — E7800 Pure hypercholesterolemia, unspecified: Secondary | ICD-10-CM

## 2013-01-23 DIAGNOSIS — E039 Hypothyroidism, unspecified: Secondary | ICD-10-CM

## 2013-01-23 MED ORDER — METFORMIN HCL 500 MG PO TABS: ORAL_TABLET | ORAL | Status: DC

## 2013-01-23 MED ORDER — OMEPRAZOLE 20 MG PO CPDR: 20.0000 mg | DELAYED_RELEASE_CAPSULE | Freq: Every day | ORAL | Status: DC

## 2013-01-23 MED ORDER — LISINOPRIL 10 MG PO TABS: 10.0000 mg | ORAL_TABLET | Freq: Every day | ORAL | Status: DC

## 2013-01-23 MED ORDER — GLIPIZIDE ER 5 MG PO TB24: 5.0000 mg | ORAL_TABLET | Freq: Every day | ORAL | Status: DC

## 2013-01-23 MED ORDER — PRAVASTATIN SODIUM 40 MG PO TABS: 40.0000 mg | ORAL_TABLET | Freq: Every day | ORAL | Status: DC

## 2013-01-23 NOTE — Assessment & Plan Note (Addendum)
GI w/up as outlined.  hgb stable.  Follow.  Discussed with her regarding hematology w/up.  She declines.

## 2013-01-23 NOTE — Progress Notes (Signed)
Pre-visit discussion using our clinic review tool. No additional management support is needed unless otherwise documented below in the visit note.  

## 2013-01-26 ENCOUNTER — Encounter: Payer: Self-pay | Admitting: Internal Medicine

## 2013-01-26 NOTE — Assessment & Plan Note (Signed)
Continue diabetic diet and exercise.  Same medications.  No lows.  Follow.  Check blood sugars bid.  A1c just checked 6.7.  Keep up to date with eye checks.

## 2013-01-26 NOTE — Assessment & Plan Note (Signed)
Seeing Dr Paul.  Off thyroid medication now.  Continues follow up with Dr Paul.  TSH 01/23/13 - wnl.   

## 2013-01-26 NOTE — Progress Notes (Signed)
  Subjective:    [name], female    DOB: 01/24/1935, 78 y.o.   MRN: [id number]  HPI 78 year old female with past history diabetes, hypercholesterolemia, osteoporosis, hypertension and hypothyroidism who comes in today to follow up on these issues as well as for a complete physical exam.   She states she has been doing well.  She sees Dr Eddie Dibbles.  Off thyroid medication.   Brought in no recorded sugar readings.  States overall sugars under reasonable control. Last a1c 6.7 (01/24/12).   No cardiac symptoms with increased activity or exertion.  Breathing stable.  No bowel change.  Does report having cold symptoms over the last several days.  Some cough.  Runny nose.  No chest congestion.  Cough only minimal.  On mucinex DM and using Flonase.  Symptoms have improved.  Overall otherwise she feels she is doing well.     Past Medical History  Diagnosis Date  . Diabetes mellitus   . Hypothyroidism   . Hypercholesterolemia   . Osteoporosis   . Anemia   . Hypertension     Current Outpatient Prescriptions on File Prior to Visit  Medication Sig Dispense Refill  . aspirin 81 MG tablet Take 81 mg by mouth daily.      . calcium-vitamin D (CALCIUM 500+D) 500-200 MG-UNIT per tablet Take 1 tablet by mouth daily.      . Glucose Blood (BLOOD GLUCOSE TEST STRIPS) STRP accu chek compact Check blood sugars bid Dx 250.00  180 each  3  . Multiple Vitamin (MULTIVITAMIN) tablet Take 1 tablet by mouth daily.       No current facility-administered medications on file prior to visit.    Review of Systems Patient denies any headache, lightheadedness or dizziness.  Some congestion as outlined.   No chest pain, tightness or palpitations.  No increased shortness of breath.  Minimal cough.   No nausea or vomiting.  No abdominal pain or cramping. No bowel change, such as diarrhea, constipation, BRBPR or melana.  No urine change.   Overall she feels she is doing well.  Had recent GI evaluation - unrevealing.   hgb decreased but stable.       Objective:   Physical Exam  Filed Vitals:   01/23/13 1016  BP: 110/60  Pulse: 80  Temp: 98 F (36.7 C)   Blood pressure recheck:  45/48  78 year old female in no acute distress.   HEENT:  Nares- clear.  Oropharynx - without lesions. NECK:  Supple.  Nontender.  No audible bruit.  HEART:  Appears to be regular. LUNGS:  No crackles or wheezing audible.  Respirations even and unlabored.  RADIAL PULSE:  Equal bilaterally.    BREASTS:  No nipple discharge or nipple retraction present.  Could not appreciate any distinct nodules or axillary adenopathy.  ABDOMEN:  Soft, nontender.  Bowel sounds present and normal.  No audible abdominal bruit.  GU:  Not performed.    EXTREMITIES:  No increased edema present.  DP pulses palpable and equal bilaterally.   FEET:  No lesions.           Assessment & Plan:  HEALTH MAINTENANCE.  Physical today.  Colonoscopy 06/25/08 with two polyps and a lipoma.  Follow up colonoscopy 04/22/12 as outlined.   Bone density 12/27/09 normal.  Mammogram 02/01/12 - Birads II.

## 2013-01-26 NOTE — Assessment & Plan Note (Signed)
Bone density 12/27/09 normal.  Off Fosamax.  Continue vitamin D.    

## 2013-01-26 NOTE — Assessment & Plan Note (Signed)
Blood pressure under good control.  Same medication regimen.  Follow metabolic panel.   

## 2013-01-26 NOTE — Assessment & Plan Note (Signed)
On Pravastatin.  Low cholesterol diet and exercise.  Follow lipid panel and liver function.  Lipid panel just checked 01/23/13 - total cholesterol 153, triglycerides 39, HDL 52 and LDL 93.

## 2013-02-03 ENCOUNTER — Ambulatory Visit: Payer: Self-pay | Admitting: Internal Medicine

## 2013-02-03 ENCOUNTER — Encounter: Payer: Self-pay | Admitting: Internal Medicine

## 2013-02-03 DIAGNOSIS — Z1231 Encounter for screening mammogram for malignant neoplasm of breast: Secondary | ICD-10-CM | POA: Diagnosis not present

## 2013-02-03 LAB — HM MAMMOGRAPHY: HM Mammogram: NEGATIVE

## 2013-02-19 ENCOUNTER — Other Ambulatory Visit: Payer: Self-pay | Admitting: Internal Medicine

## 2013-03-06 ENCOUNTER — Other Ambulatory Visit: Payer: Medicare Other

## 2013-03-10 ENCOUNTER — Other Ambulatory Visit (INDEPENDENT_AMBULATORY_CARE_PROVIDER_SITE_OTHER): Payer: Medicare Other

## 2013-03-10 ENCOUNTER — Encounter: Payer: Self-pay | Admitting: Internal Medicine

## 2013-03-10 DIAGNOSIS — D649 Anemia, unspecified: Secondary | ICD-10-CM | POA: Diagnosis not present

## 2013-03-10 DIAGNOSIS — K5090 Crohn's disease, unspecified, without complications: Secondary | ICD-10-CM | POA: Diagnosis not present

## 2013-03-10 LAB — CBC WITH DIFFERENTIAL/PLATELET
BASOS ABS: 0.1 10*3/uL (ref 0.0–0.1)
Basophils Relative: 0.8 % (ref 0.0–3.0)
Eosinophils Absolute: 0.1 10*3/uL (ref 0.0–0.7)
Eosinophils Relative: 1.5 % (ref 0.0–5.0)
HCT: 37.3 % (ref 36.0–46.0)
HEMOGLOBIN: 11.6 g/dL — AB (ref 12.0–15.0)
Lymphocytes Relative: 23.3 % (ref 12.0–46.0)
Lymphs Abs: 1.8 10*3/uL (ref 0.7–4.0)
MCHC: 31.2 g/dL (ref 30.0–36.0)
MCV: 80.2 fl (ref 78.0–100.0)
MONOS PCT: 6.7 % (ref 3.0–12.0)
Monocytes Absolute: 0.5 10*3/uL (ref 0.1–1.0)
NEUTROS ABS: 5.4 10*3/uL (ref 1.4–7.7)
NEUTROS PCT: 67.7 % (ref 43.0–77.0)
Platelets: 288 10*3/uL (ref 150.0–400.0)
RBC: 4.65 Mil/uL (ref 3.87–5.11)
RDW: 16.5 % — ABNORMAL HIGH (ref 11.5–14.6)
WBC: 7.9 10*3/uL (ref 4.5–10.5)

## 2013-03-10 LAB — IBC PANEL
Iron: 46 ug/dL (ref 42–145)
SATURATION RATIOS: 13.6 % — AB (ref 20.0–50.0)
TRANSFERRIN: 242.1 mg/dL (ref 212.0–360.0)

## 2013-03-10 LAB — VITAMIN B12: VITAMIN B 12: 609 pg/mL (ref 211–911)

## 2013-03-11 LAB — FOLATE RBC: RBC Folate: 670 ng/mL (ref >280)

## 2013-04-01 ENCOUNTER — Encounter: Payer: Self-pay | Admitting: Adult Health

## 2013-04-01 ENCOUNTER — Ambulatory Visit (INDEPENDENT_AMBULATORY_CARE_PROVIDER_SITE_OTHER): Payer: Medicare Other | Admitting: Adult Health

## 2013-04-01 VITALS — BP 110/58 | HR 80 | Temp 98.2°F | Resp 12 | Wt 144.0 lb

## 2013-04-01 DIAGNOSIS — R21 Rash and other nonspecific skin eruption: Secondary | ICD-10-CM | POA: Diagnosis not present

## 2013-04-01 NOTE — Assessment & Plan Note (Signed)
Suspect rash 2/2 coming in contact with something outdoors. Do not think it is from the vitamins. I have asked her to hold the vitamins until rash resolves completely. She is to take benadryl 1/2 tab to 1 tablet every 8 hours as needed. Use Aveeno body wash. Call if worsens or no improvement within 3-4 days.

## 2013-04-01 NOTE — Progress Notes (Signed)
   Subjective:    [name], female    DOB: 01-Apr-1935, 78 y.o.   MRN: [id number]  HPI Patient is a pleasant 78 year old female who presents to clinic with a rash that developed on Saturday. She had been outdoors; however, she does not recall getting into anything that may have caused a rash. She denies using any new lotions, soaps, laundry detergent. She has not started any new medications other than new vitamins at the end of February. She has been taking these vitamins without any problems but she is wondering if this could have caused the rash. She denies itchiness. She reports that the rash is significantly improved since Saturday.  Current Outpatient Prescriptions on File Prior to Visit  Medication Sig Dispense Refill  . ACCU-CHEK COMPACT PLUS test strip CHECK TWICE DAILY  153 each  2  . aspirin 81 MG tablet Take 81 mg by mouth daily.      . calcium-vitamin D (CALCIUM 500+D) 500-200 MG-UNIT per tablet Take 1 tablet by mouth daily.      Marland Kitchen glipiZIDE (GLIPIZIDE XL) 5 MG 24 hr tablet Take 1 tablet (5 mg total) by mouth daily.  90 tablet  1  . lisinopril (PRINIVIL,ZESTRIL) 10 MG tablet Take 1 tablet (10 mg total) by mouth daily.  90 tablet  1  . metFORMIN (GLUCOPHAGE) 500 MG tablet Two tablets bid  360 tablet  3  . Multiple Vitamin (MULTIVITAMIN) tablet Take 1 tablet by mouth daily.      Marland Kitchen omeprazole (PRILOSEC) 20 MG capsule Take 1 capsule (20 mg total) by mouth daily.  90 capsule  1  . pravastatin (PRAVACHOL) 40 MG tablet Take 1 tablet (40 mg total) by mouth daily.  90 tablet  3   No current facility-administered medications on file prior to visit.      Review of Systems  Constitutional: Negative.   Respiratory: Negative.   Cardiovascular: Negative.   Skin: Positive for rash.  Psychiatric/Behavioral: Negative.   All other systems reviewed and are negative.       Objective:   Physical Exam  Constitutional: She is oriented to person, place, and time. She appears  well-developed and well-nourished. No distress.  HENT:  Head: Normocephalic and atraumatic.  Cardiovascular: Normal rate and regular rhythm.   Pulmonary/Chest: Effort normal. No respiratory distress.  Musculoskeletal: Normal range of motion.  Neurological: She is alert and oriented to person, place, and time.  Skin: Skin is warm and dry. Rash noted.  Mild rash on upper extremities and left leg. Not itching. Appears to be improving. There is no itching.  Psychiatric: She has a normal mood and affect. Her behavior is normal. Judgment and thought content normal.          Assessment & Plan:

## 2013-04-01 NOTE — Patient Instructions (Signed)
  Take benadryl 1 tablet every 8 hours as needed for the rash. If 1 tablet is too strong you can try 1/2 of a tablet.  Use [place] oatmeal wash for the rash.  Call if your symptoms are not improved within 3-4 days.

## 2013-04-01 NOTE — Progress Notes (Signed)
Pre visit review using our clinic review tool, if applicable. No additional management support is needed unless otherwise documented below in the visit note. 

## 2013-05-26 ENCOUNTER — Ambulatory Visit: Payer: Medicare Other | Admitting: Internal Medicine

## 2013-06-12 ENCOUNTER — Ambulatory Visit (INDEPENDENT_AMBULATORY_CARE_PROVIDER_SITE_OTHER): Payer: Medicare Other | Admitting: Internal Medicine

## 2013-06-12 ENCOUNTER — Encounter: Payer: Self-pay | Admitting: Internal Medicine

## 2013-06-12 VITALS — BP 130/70 | HR 67 | Temp 98.2°F | Ht 64.0 in | Wt 140.8 lb

## 2013-06-12 DIAGNOSIS — E7800 Pure hypercholesterolemia, unspecified: Secondary | ICD-10-CM

## 2013-06-12 DIAGNOSIS — I10 Essential (primary) hypertension: Secondary | ICD-10-CM | POA: Diagnosis not present

## 2013-06-12 DIAGNOSIS — D649 Anemia, unspecified: Secondary | ICD-10-CM | POA: Diagnosis not present

## 2013-06-12 DIAGNOSIS — E119 Type 2 diabetes mellitus without complications: Secondary | ICD-10-CM | POA: Diagnosis not present

## 2013-06-12 DIAGNOSIS — M810 Age-related osteoporosis without current pathological fracture: Secondary | ICD-10-CM

## 2013-06-12 DIAGNOSIS — E039 Hypothyroidism, unspecified: Secondary | ICD-10-CM

## 2013-06-12 LAB — LIPID PANEL
CHOL/HDL RATIO: 3
Cholesterol: 143 mg/dL (ref 0–200)
HDL: 48.3 mg/dL (ref >39.00)
LDL Cholesterol: 82 mg/dL (ref 0–99)
NONHDL: 94.7
TRIGLYCERIDES: 63 mg/dL (ref 0.0–149.0)
VLDL: 12.6 mg/dL (ref 0.0–40.0)

## 2013-06-12 LAB — CBC WITH DIFFERENTIAL/PLATELET
Basophils Absolute: 0.1 10*3/uL (ref 0.0–0.1)
Basophils Relative: 0.7 % (ref 0.0–3.0)
EOS ABS: 0.1 10*3/uL (ref 0.0–0.7)
Eosinophils Relative: 1.3 % (ref 0.0–5.0)
HEMATOCRIT: 35.5 % — AB (ref 36.0–46.0)
Hemoglobin: 11.3 g/dL — ABNORMAL LOW (ref 12.0–15.0)
LYMPHS ABS: 2.1 10*3/uL (ref 0.7–4.0)
Lymphocytes Relative: 25.5 % (ref 12.0–46.0)
MCHC: 31.8 g/dL (ref 30.0–36.0)
MCV: 79.2 fl (ref 78.0–100.0)
MONO ABS: 0.6 10*3/uL (ref 0.1–1.0)
Monocytes Relative: 7.3 % (ref 3.0–12.0)
Neutro Abs: 5.4 10*3/uL (ref 1.4–7.7)
Neutrophils Relative %: 65.2 % (ref 43.0–77.0)
Platelets: 285 10*3/uL (ref 150.0–400.0)
RBC: 4.48 Mil/uL (ref 3.87–5.11)
RDW: 17.1 % — AB (ref 11.5–15.5)
WBC: 8.3 10*3/uL (ref 4.0–10.5)

## 2013-06-12 LAB — BASIC METABOLIC PANEL
BUN: 22 mg/dL (ref 6–23)
CALCIUM: 9.7 mg/dL (ref 8.4–10.5)
CO2: 26 meq/L (ref 19–32)
Chloride: 105 mEq/L (ref 96–112)
Creatinine, Ser: 0.7 mg/dL (ref 0.4–1.2)
GFR: 81.06 mL/min (ref >60.00)
Glucose, Bld: 112 mg/dL — ABNORMAL HIGH (ref 70–99)
Potassium: 4.6 mEq/L (ref 3.5–5.1)
SODIUM: 138 meq/L (ref 135–145)

## 2013-06-12 LAB — HEPATIC FUNCTION PANEL
ALBUMIN: 4.1 g/dL (ref 3.5–5.2)
ALT: 18 U/L (ref 0–35)
AST: 21 U/L (ref 0–37)
Alkaline Phosphatase: 38 U/L — ABNORMAL LOW (ref 39–117)
Bilirubin, Direct: 0.1 mg/dL (ref 0.0–0.3)
Total Bilirubin: 0.6 mg/dL (ref 0.2–1.2)
Total Protein: 7.7 g/dL (ref 6.0–8.3)

## 2013-06-12 LAB — HEMOGLOBIN A1C: Hgb A1c MFr Bld: 6.9 % — ABNORMAL HIGH (ref 4.6–6.5)

## 2013-06-12 LAB — FERRITIN: FERRITIN: 137.7 ng/mL (ref 10.0–291.0)

## 2013-06-12 NOTE — Progress Notes (Signed)
Pre visit review using our clinic review tool, if applicable. No additional management support is needed unless otherwise documented below in the visit note. 

## 2013-06-15 ENCOUNTER — Encounter: Payer: Self-pay | Admitting: Internal Medicine

## 2013-06-15 NOTE — Progress Notes (Signed)
Subjective:    [name], female    DOB: Mar 20, 1935, 78 y.o.   MRN: [id number]  HPI 78 year old female with past history diabetes, hypercholesterolemia, osteoporosis, hypertension and hypothyroidism who comes in today for a scheduled follow up.  She states she has been doing well.  She sees Dr Eddie Dibbles.  Off thyroid medication.   Brought in no recorded sugar readings.  States in the am sugars have been averaging in the 90s.  Highest reading 130.  No cardiac symptoms with increased activity or exertion.  Breathing stable.  No bowel change.  Overall she feels good.      Past Medical History  Diagnosis Date  . Diabetes mellitus   . Hypothyroidism   . Hypercholesterolemia   . Osteoporosis   . Anemia   . Hypertension     Current Outpatient Prescriptions on File Prior to Visit  Medication Sig Dispense Refill  . ACCU-CHEK COMPACT PLUS test strip CHECK TWICE DAILY  153 each  2  . aspirin 81 MG tablet Take 81 mg by mouth daily.      . calcium-vitamin D (CALCIUM 500+D) 500-200 MG-UNIT per tablet Take 1 tablet by mouth daily.      Marland Kitchen glipiZIDE (GLIPIZIDE XL) 5 MG 24 hr tablet Take 1 tablet (5 mg total) by mouth daily.  90 tablet  1  . lisinopril (PRINIVIL,ZESTRIL) 10 MG tablet Take 1 tablet (10 mg total) by mouth daily.  90 tablet  1  . metFORMIN (GLUCOPHAGE) 500 MG tablet Two tablets bid  360 tablet  3  . Multiple Vitamin (MULTIVITAMIN) tablet Take 1 tablet by mouth daily.      Marland Kitchen omeprazole (PRILOSEC) 20 MG capsule Take 1 capsule (20 mg total) by mouth daily.  90 capsule  1  . pravastatin (PRAVACHOL) 40 MG tablet Take 1 tablet (40 mg total) by mouth daily.  90 tablet  3   No current facility-administered medications on file prior to visit.    Review of Systems Patient denies any headache, lightheadedness or dizziness.  No allergy or sinus issues.   No chest pain, tightness or palpitations.  No increased shortness of breath.  No cough or congestion.  No nausea or vomiting.  No  abdominal pain or cramping. No bowel change, such as diarrhea, constipation, BRBPR or melana.  No urine change.   Overall she feels she is doing well.  Had recent GI evaluation - unrevealing.  hgb decreased but stable.       Objective:   Physical Exam  Filed Vitals:   06/12/13 0959  BP: 130/70  Pulse: 67  Temp: 98.2 F (36.8 C)   Blood pressure recheck:  1/26  78 year old female in no acute distress.   HEENT:  Nares- clear.  Oropharynx - without lesions. NECK:  Supple.  Nontender.  No audible bruit.  HEART:  Appears to be regular. LUNGS:  No crackles or wheezing audible.  Respirations even and unlabored.  RADIAL PULSE:  Equal bilaterally. ABDOMEN:  Soft, nontender.  Bowel sounds present and normal.  No audible abdominal bruit.    EXTREMITIES:  No increased edema present.  DP pulses palpable and equal bilaterally.   FEET:  No lesions.           Assessment & Plan:  HEALTH MAINTENANCE.  Physical 01/23/13.  Colonoscopy 06/25/08 with two polyps and a lipoma.  Follow up colonoscopy 04/22/12 as outlined.   Bone density 12/27/09 normal.  Mammogram 02/03/13 - Birads I.

## 2013-06-15 NOTE — Assessment & Plan Note (Signed)
Blood pressure under good control.  Same medication regimen.  Follow metabolic panel.   

## 2013-06-15 NOTE — Assessment & Plan Note (Signed)
Continue diabetic diet and exercise.  Same medications.  No lows.  Follow.  Check blood sugars bid.  Check metabolic panel and G3T.

## 2013-06-15 NOTE — Assessment & Plan Note (Signed)
Bone density 12/27/09 normal.  Off Fosamax.  Continue vitamin D.

## 2013-06-15 NOTE — Assessment & Plan Note (Signed)
GI w/up as outlined.  Hgb stable.  Follow.  Have discussed with her regarding hematology w/up.  She declines.

## 2013-06-15 NOTE — Assessment & Plan Note (Signed)
Seeing Dr Eddie Dibbles.  Off thyroid medication now.  Continues follow up with Dr Eddie Dibbles.  TSH 01/23/13 - wnl.

## 2013-06-15 NOTE — Assessment & Plan Note (Signed)
On Pravastatin.  Low cholesterol diet and exercise.  Follow lipid panel and liver function.

## 2013-06-16 ENCOUNTER — Encounter: Payer: Self-pay | Admitting: Internal Medicine

## 2013-06-18 NOTE — Telephone Encounter (Signed)
Unread mychart message mailed to patient 

## 2013-07-04 ENCOUNTER — Other Ambulatory Visit: Payer: Self-pay | Admitting: Internal Medicine

## 2013-08-04 ENCOUNTER — Other Ambulatory Visit: Payer: Self-pay | Admitting: Internal Medicine

## 2013-08-07 ENCOUNTER — Other Ambulatory Visit: Payer: Self-pay | Admitting: *Deleted

## 2013-08-07 MED ORDER — GLUCOSE BLOOD VI STRP: ORAL_STRIP | Status: DC

## 2013-08-19 ENCOUNTER — Other Ambulatory Visit: Payer: Self-pay | Admitting: Internal Medicine

## 2013-10-09 ENCOUNTER — Telehealth: Payer: Self-pay | Admitting: *Deleted

## 2013-10-09 DIAGNOSIS — E7800 Pure hypercholesterolemia, unspecified: Secondary | ICD-10-CM

## 2013-10-09 DIAGNOSIS — E119 Type 2 diabetes mellitus without complications: Secondary | ICD-10-CM

## 2013-10-09 DIAGNOSIS — E039 Hypothyroidism, unspecified: Secondary | ICD-10-CM

## 2013-10-09 DIAGNOSIS — D649 Anemia, unspecified: Secondary | ICD-10-CM

## 2013-10-09 DIAGNOSIS — I10 Essential (primary) hypertension: Secondary | ICD-10-CM

## 2013-10-09 NOTE — Telephone Encounter (Signed)
Pt is coming in tomorrow what labs and dX? 

## 2013-10-10 ENCOUNTER — Other Ambulatory Visit (INDEPENDENT_AMBULATORY_CARE_PROVIDER_SITE_OTHER): Payer: Medicare Other

## 2013-10-10 DIAGNOSIS — D649 Anemia, unspecified: Secondary | ICD-10-CM | POA: Diagnosis not present

## 2013-10-10 DIAGNOSIS — E7800 Pure hypercholesterolemia, unspecified: Secondary | ICD-10-CM

## 2013-10-10 DIAGNOSIS — E119 Type 2 diabetes mellitus without complications: Secondary | ICD-10-CM

## 2013-10-10 DIAGNOSIS — E039 Hypothyroidism, unspecified: Secondary | ICD-10-CM

## 2013-10-10 LAB — BASIC METABOLIC PANEL
BUN: 19 mg/dL (ref 6–23)
CALCIUM: 9.7 mg/dL (ref 8.4–10.5)
CO2: 24 mEq/L (ref 19–32)
CREATININE: 0.7 mg/dL (ref 0.4–1.2)
Chloride: 104 mEq/L (ref 96–112)
GFR: 89.29 mL/min (ref >60.00)
Glucose, Bld: 114 mg/dL — ABNORMAL HIGH (ref 70–99)
Potassium: 4.5 mEq/L (ref 3.5–5.1)
SODIUM: 137 meq/L (ref 135–145)

## 2013-10-10 LAB — CBC WITH DIFFERENTIAL/PLATELET
BASOS ABS: 0.1 10*3/uL (ref 0.0–0.1)
Basophils Relative: 0.6 % (ref 0.0–3.0)
Eosinophils Absolute: 0.1 10*3/uL (ref 0.0–0.7)
Eosinophils Relative: 1.3 % (ref 0.0–5.0)
HEMATOCRIT: 35.3 % — AB (ref 36.0–46.0)
HEMOGLOBIN: 11.3 g/dL — AB (ref 12.0–15.0)
LYMPHS PCT: 26.4 % (ref 12.0–46.0)
Lymphs Abs: 2.4 10*3/uL (ref 0.7–4.0)
MCHC: 32.1 g/dL (ref 30.0–36.0)
MCV: 78.4 fl (ref 78.0–100.0)
Monocytes Absolute: 0.7 10*3/uL (ref 0.1–1.0)
Monocytes Relative: 7.7 % (ref 3.0–12.0)
NEUTROS ABS: 5.7 10*3/uL (ref 1.4–7.7)
Neutrophils Relative %: 64 % (ref 43.0–77.0)
PLATELETS: 280 10*3/uL (ref 150.0–400.0)
RBC: 4.5 Mil/uL (ref 3.87–5.11)
RDW: 16.3 % — AB (ref 11.5–15.5)
WBC: 9 10*3/uL (ref 4.0–10.5)

## 2013-10-10 LAB — LIPID PANEL
CHOLESTEROL: 155 mg/dL (ref 0–200)
HDL: 48.8 mg/dL (ref >39.00)
LDL CALC: 94 mg/dL (ref 0–99)
NonHDL: 106.2
Total CHOL/HDL Ratio: 3
Triglycerides: 62 mg/dL (ref 0.0–149.0)
VLDL: 12.4 mg/dL (ref 0.0–40.0)

## 2013-10-10 LAB — TSH: TSH: 0.52 u[IU]/mL (ref 0.35–4.50)

## 2013-10-10 LAB — HEPATIC FUNCTION PANEL
ALBUMIN: 4.2 g/dL (ref 3.5–5.2)
ALK PHOS: 44 U/L (ref 39–117)
ALT: 25 U/L (ref 0–35)
AST: 21 U/L (ref 0–37)
Bilirubin, Direct: 0.1 mg/dL (ref 0.0–0.3)
TOTAL PROTEIN: 7.6 g/dL (ref 6.0–8.3)
Total Bilirubin: 0.7 mg/dL (ref 0.2–1.2)

## 2013-10-10 LAB — HEMOGLOBIN A1C: Hgb A1c MFr Bld: 7.1 % — ABNORMAL HIGH (ref 4.6–6.5)

## 2013-10-10 LAB — FERRITIN: Ferritin: 129.6 ng/mL (ref 10.0–291.0)

## 2013-10-10 NOTE — Telephone Encounter (Signed)
Orders placed for labs

## 2013-10-11 ENCOUNTER — Encounter: Payer: Self-pay | Admitting: Internal Medicine

## 2013-10-13 ENCOUNTER — Ambulatory Visit (INDEPENDENT_AMBULATORY_CARE_PROVIDER_SITE_OTHER): Payer: Medicare Other | Admitting: Internal Medicine

## 2013-10-13 ENCOUNTER — Encounter: Payer: Self-pay | Admitting: Internal Medicine

## 2013-10-13 VITALS — BP 130/70 | HR 68 | Temp 98.2°F | Ht 64.0 in | Wt 143.5 lb

## 2013-10-13 DIAGNOSIS — D649 Anemia, unspecified: Secondary | ICD-10-CM

## 2013-10-13 DIAGNOSIS — E7800 Pure hypercholesterolemia, unspecified: Secondary | ICD-10-CM

## 2013-10-13 DIAGNOSIS — E039 Hypothyroidism, unspecified: Secondary | ICD-10-CM

## 2013-10-13 DIAGNOSIS — I10 Essential (primary) hypertension: Secondary | ICD-10-CM

## 2013-10-13 DIAGNOSIS — Z23 Encounter for immunization: Secondary | ICD-10-CM | POA: Diagnosis not present

## 2013-10-13 DIAGNOSIS — E119 Type 2 diabetes mellitus without complications: Secondary | ICD-10-CM | POA: Diagnosis not present

## 2013-10-13 DIAGNOSIS — M79606 Pain in leg, unspecified: Secondary | ICD-10-CM

## 2013-10-13 DIAGNOSIS — M810 Age-related osteoporosis without current pathological fracture: Secondary | ICD-10-CM

## 2013-10-13 NOTE — Progress Notes (Signed)
Pre visit review using our clinic review tool, if applicable. No additional management support is needed unless otherwise documented below in the visit note. 

## 2013-10-14 ENCOUNTER — Encounter: Payer: Self-pay | Admitting: Internal Medicine

## 2013-10-14 DIAGNOSIS — M79606 Pain in leg, unspecified: Secondary | ICD-10-CM | POA: Insufficient documentation

## 2013-10-14 NOTE — Assessment & Plan Note (Signed)
Continue diabetic diet and exercise.  Same medications.  Needs to take her medication regularly.  Has been skipping doses.  No lows.  Follow.  Check blood sugars bid.  Follow metabolic panel and B0Z.  Keep up to date with eye checks.

## 2013-10-14 NOTE — Progress Notes (Signed)
Subjective:    [name], female    DOB: 1935/12/29, 78 y.o.   MRN: [id number]  HPI 78 year old female with past history diabetes, hypercholesterolemia, osteoporosis, hypertension and hypothyroidism who comes in today for a scheduled follow up.  She states she has been doing well.  She sees Dr Eddie Dibbles.  Off thyroid medication.   Brought in no recorded sugar readings.  A1c recently checked and elevated - 7.1.  She reports forgetting her pm medication.  No cardiac symptoms with increased activity or exertion.  Breathing stable.  No bowel change.  Overall she feels good.  She does report some pain in her legs.  Localizes the pain to posterior knees/popliteal region.  Present for the last two months.  Denies any new activity.  States mostly notices while standing making breakfast.  Taking occasional tylenol.  No increased swelling or redness.  No injury.     Past Medical History  Diagnosis Date  . Diabetes mellitus   . Hypothyroidism   . Hypercholesterolemia   . Osteoporosis   . Anemia   . Hypertension     Current Outpatient Prescriptions on File Prior to Visit  Medication Sig Dispense Refill  . aspirin 81 MG tablet Take 81 mg by mouth daily.      . calcium-vitamin D (CALCIUM 500+D) 500-200 MG-UNIT per tablet Take 1 tablet by mouth daily.      Marland Kitchen GLIPIZIDE XL 5 MG 24 hr tablet TAKE 1 TABLET (5 MG TOTAL) DAILY  90 tablet  1  . glucose blood (ACCU-CHEK COMPACT PLUS) test strip CHECK ONCE DAILY  100 each  2  . lisinopril (PRINIVIL,ZESTRIL) 10 MG tablet TAKE 1 TABLET (10 MG TOTAL) DAILY  90 tablet  1  . metFORMIN (GLUCOPHAGE) 500 MG tablet Two tablets bid  360 tablet  3  . Multiple Vitamin (MULTIVITAMIN) tablet Take 1 tablet by mouth daily.      Marland Kitchen omeprazole (PRILOSEC) 20 MG capsule TAKE 1 CAPSULE (20 MG TOTAL) DAILY  90 capsule  1  . pravastatin (PRAVACHOL) 40 MG tablet Take 1 tablet (40 mg total) by mouth daily.  90 tablet  3   No current facility-administered medications on  file prior to visit.    Review of Systems Patient denies any headache, lightheadedness or dizziness.  No allergy or sinus issues.   No chest pain, tightness or palpitations.  No increased shortness of breath.  No cough or congestion.  No nausea or vomiting.  No abdominal pain or cramping. No bowel change, such as diarrhea, constipation, BRBPR or melana.  No urine change.   Overall she feels she is doing well.  Had recent GI evaluation - unrevealing.  hgb decreased but stable.  Pain in the legs as outlined.  No pain in the back, hips or upper thighs.  No pain or burning in the feet.       Objective:   Physical Exam  Filed Vitals:   10/13/13 0957  BP: 130/70  Pulse: 68  Temp: 98.2 F (36.8 C)   Blood pressure recheck:  134/72, pulse 22  78 year old female in no acute distress.   HEENT:  Nares- clear.  Oropharynx - without lesions. NECK:  Supple.  Nontender.  No audible bruit.  HEART:  Appears to be regular. LUNGS:  No crackles or wheezing audible.  Respirations even and unlabored.  RADIAL PULSE:  Equal bilaterally. ABDOMEN:  Soft, nontender.  Bowel sounds present and normal.  No audible abdominal bruit.  EXTREMITIES:  No increased edema present.  DP pulses palpable and equal bilaterally.   FEET:  No lesions.   MSK:  No pain to palpation of the legs.  No pain with flexion or extension.  No pain with straight leg raise.  No pain with walking or bending.  Unable to reproduce pain on exam.           Assessment & Plan:  HEALTH MAINTENANCE.  Physical 01/23/13.  Colonoscopy 06/25/08 with two polyps and a lipoma.  Follow up colonoscopy 04/22/12 as outlined.   Bone density 12/27/09 normal.  Mammogram 02/03/13 - Birads I.    I spent 25 minutes with the patient and more then 50% of the time was spent in consultation regarding the above.

## 2013-10-14 NOTE — Assessment & Plan Note (Signed)
GI w/up as outlined.  Hgb stable.  Follow.  Have discussed with her regarding hematology w/up.  She declines.

## 2013-10-14 NOTE — Assessment & Plan Note (Signed)
On Pravastatin.  Low cholesterol diet and exercise.  Follow lipid panel and liver function.

## 2013-10-14 NOTE — Assessment & Plan Note (Signed)
No reproducible pain on exam.  Discussed further evaluation.  Take tylenol as directed.  Exercise as directed.  Follow.  Call with update in a few weeks.

## 2013-10-14 NOTE — Assessment & Plan Note (Signed)
Bone density 12/27/09 normal.  Off Fosamax.  Continue vitamin D.

## 2013-10-14 NOTE — Assessment & Plan Note (Signed)
Seeing Dr Eddie Dibbles.  Off thyroid medication now.  Continues follow up with Dr Eddie Dibbles.  TSH 01/23/13 - wnl.

## 2013-10-14 NOTE — Assessment & Plan Note (Signed)
Blood pressure under good control.  Same medication regimen.  Follow metabolic panel.   

## 2013-12-08 ENCOUNTER — Other Ambulatory Visit: Payer: Self-pay | Admitting: Internal Medicine

## 2013-12-08 IMAGING — US THYROID ULTRASOUND
1 series · 14 of 25 positions shown · non-contrast
Comparison: none

REASON FOR EXAM: goiter nontoxic
COMMENTS:

[Series 1: thyroid ultrasound · 0.10mm/px · 14 of 39 slices shown]
[im 1/39]
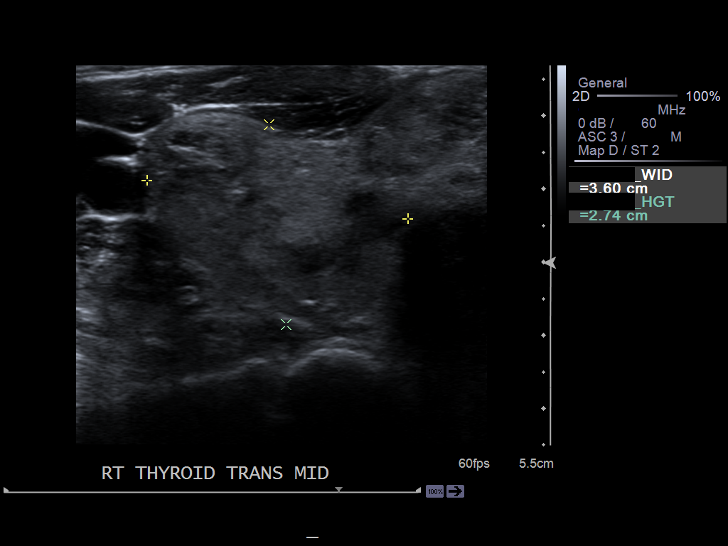
[im 4/39]
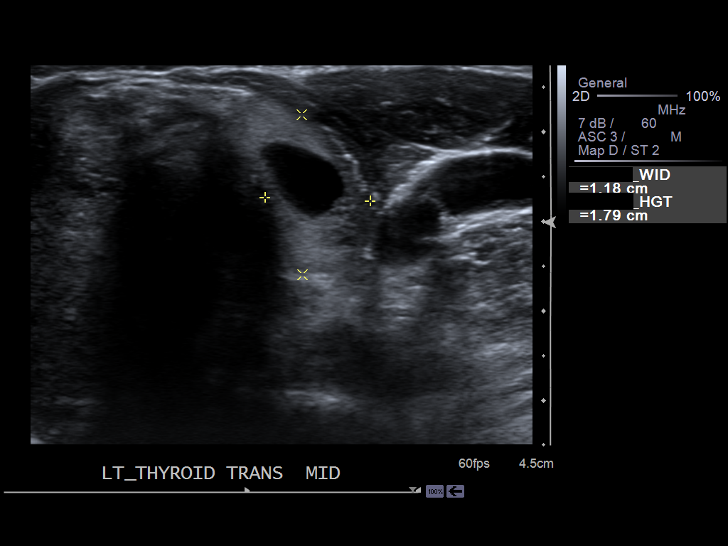
[im 7/39]
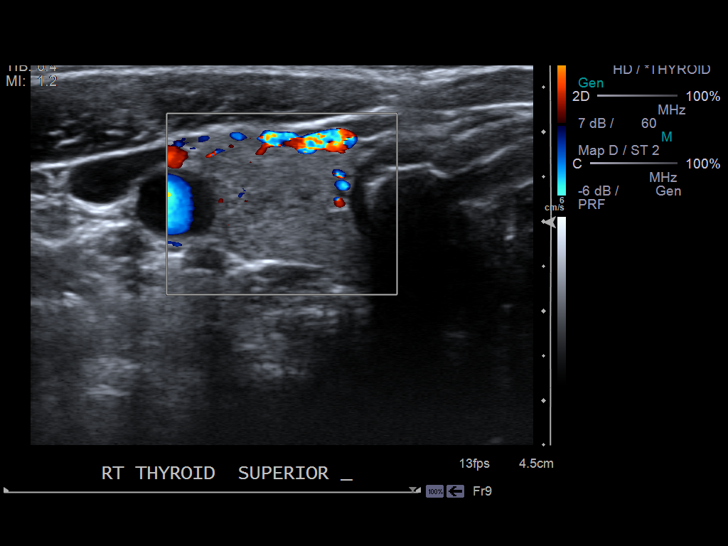
[im 10/39]
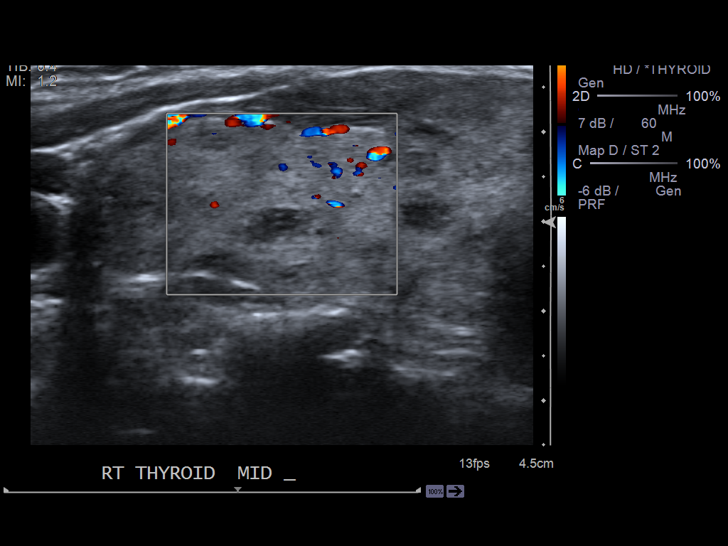
[im 13/39]
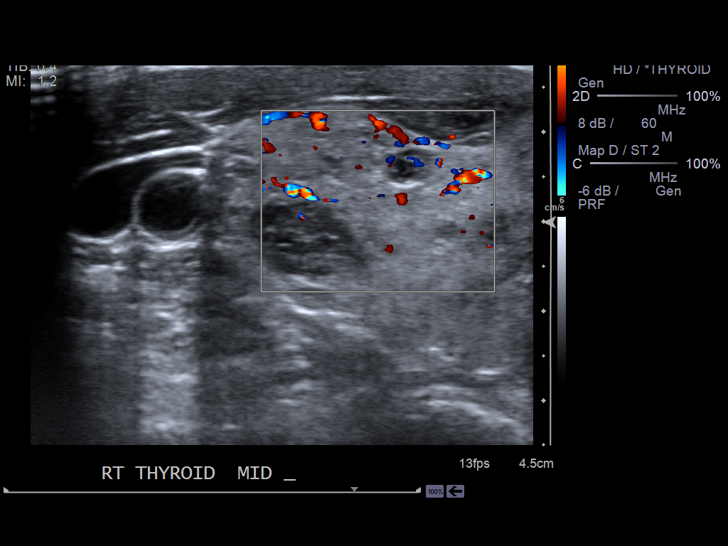
[im 15/39]
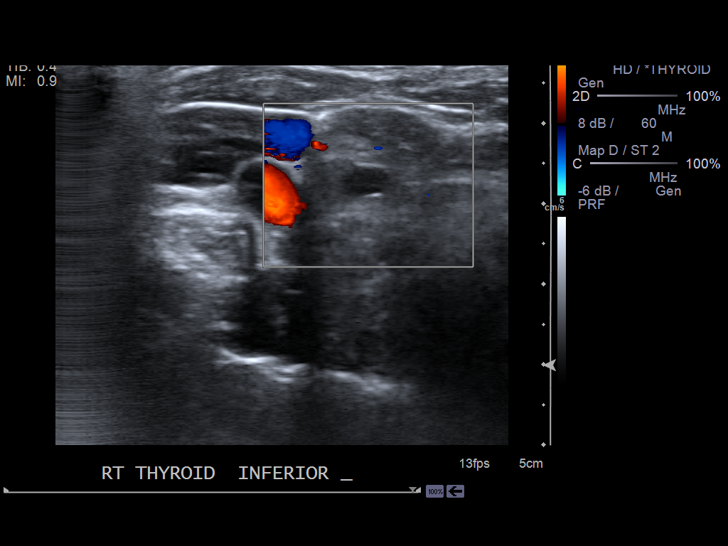
[im 18/39]
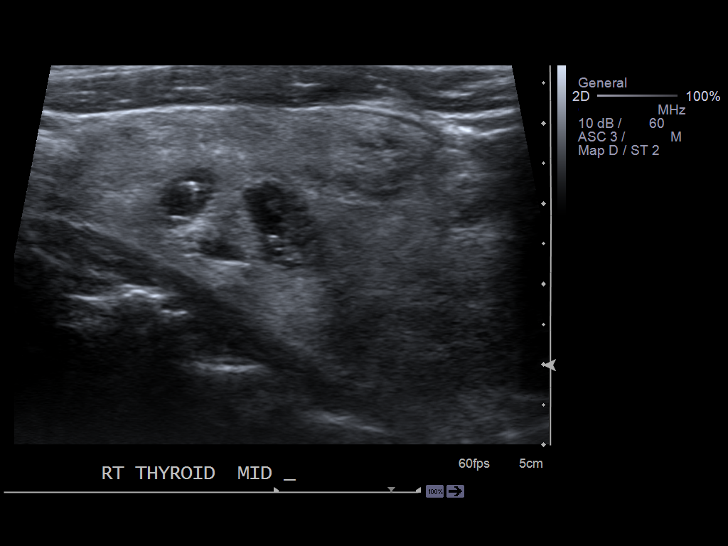
[im 21/39]
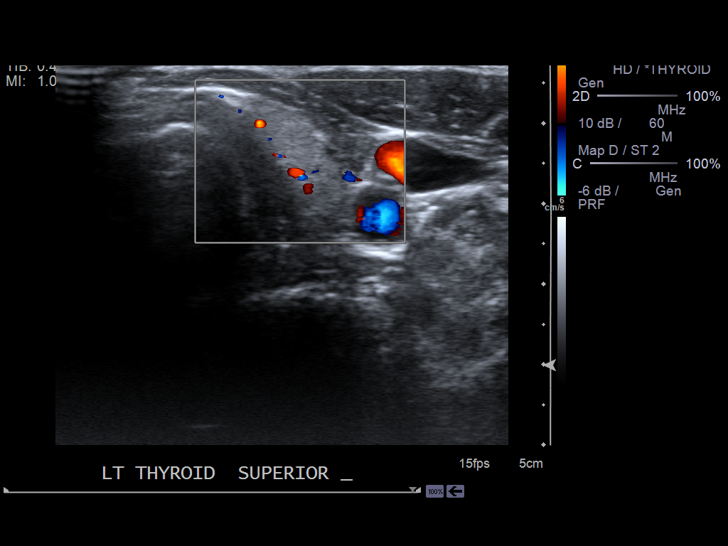
[im 24/39]
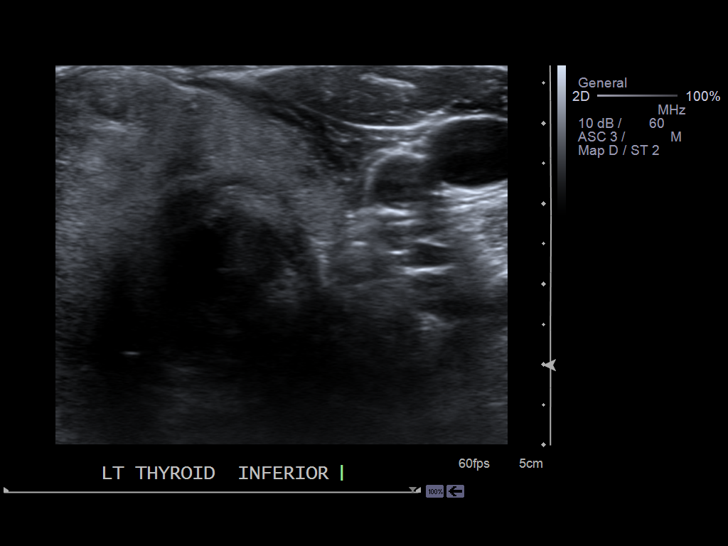
[im 26/39]
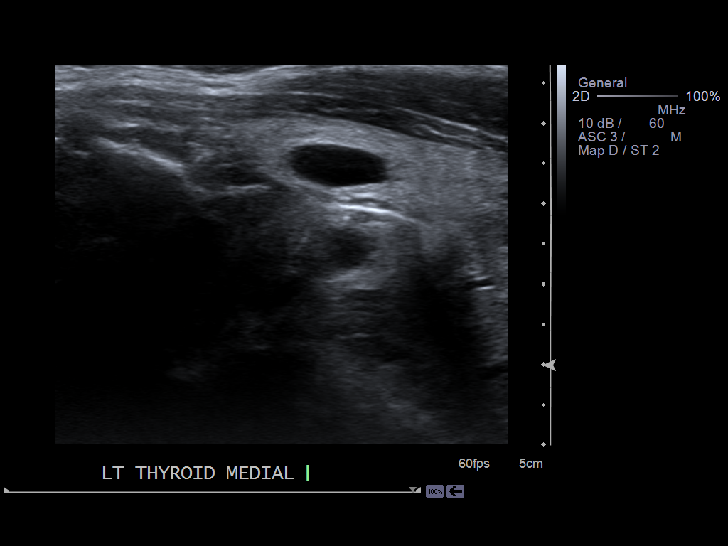
[im 29/39]
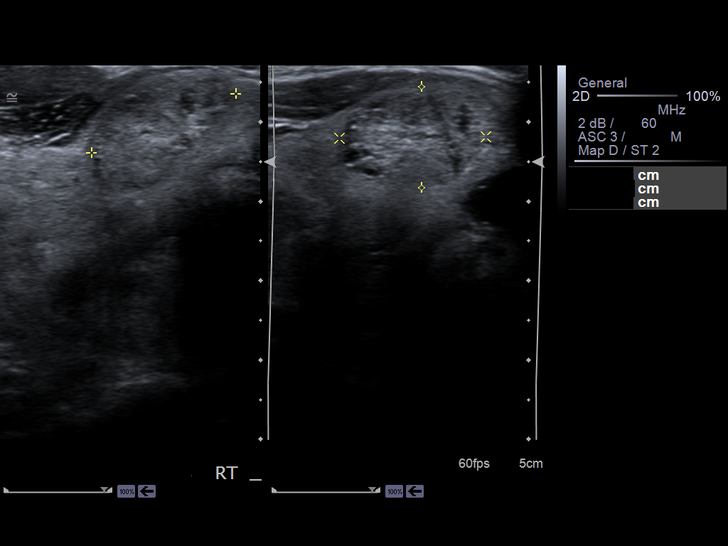
[im 32/39]
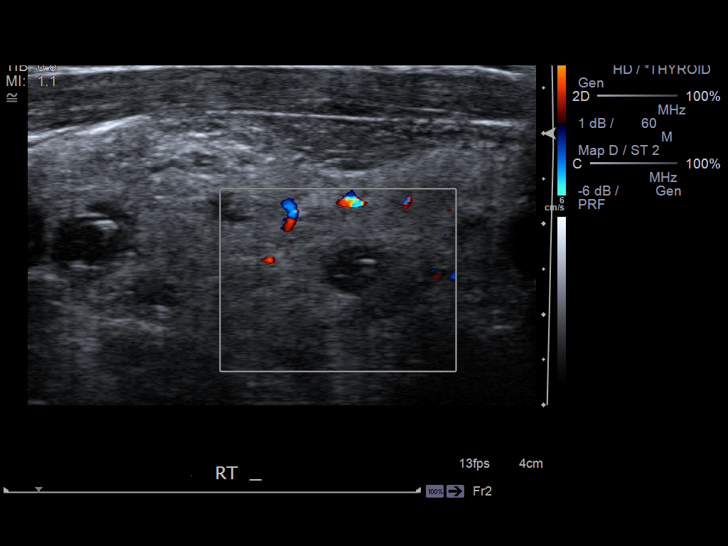
[im 35/39]
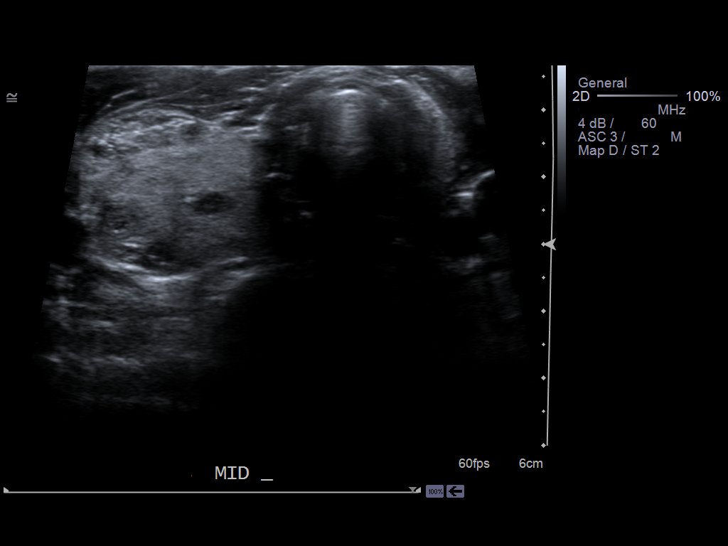
[im 39/39]
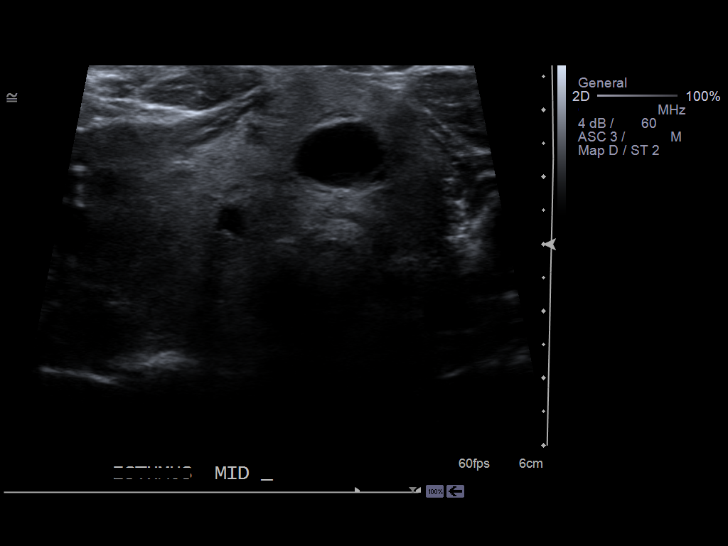

[14 of 25 positions shown; findings below may reference images not displayed]

PROCEDURE:     US  - US SOFT TISSUE HEAD/NECK/THYROID  - March 05, 2012 [DATE]

RESULT:     The patient has a known multinodular goiter.

The right lobe measures 3.6 x 2.7 x 5.9 cm. And contains a complex mass in
the midpole measuring 1 x 0.9 x 1.2 cm. A smaller hypoechoic mass in the
adjacent tissue measures 0.6, 0.3 to 0.7 cm. A third measures 1.3 to 0.8 x 1
cm. A fourth measures approximately 5 mm in diameter. A simple appearing
cyst in the lower pole measures 0.4 x 0.6 x 0.6 cm. This has decreased in
size since the previous study

In the thyroid isthmus a dominant complex appearing nodule measures 2 x
x 1.3 cm which is stable. A complex hypoechoic nodule measures
centimeters in diameter. A cystic structure measures 1.4 x 0.9 x 1.2 cm.

On the left the echotexture is relatively homogeneous with the exception of
a stable cyst in the midpole measuring 1 x 0.8 x 1.3 cm.
IMPRESSION: There are cystic and hypoechoic complex appearing nodule
nodules in the thyroid gland as described above.

[REDACTED]

## 2013-12-09 DIAGNOSIS — H04123 Dry eye syndrome of bilateral lacrimal glands: Secondary | ICD-10-CM | POA: Diagnosis not present

## 2013-12-09 LAB — HM DIABETES EYE EXAM

## 2013-12-22 ENCOUNTER — Encounter: Payer: Self-pay | Admitting: Internal Medicine

## 2014-01-08 ENCOUNTER — Other Ambulatory Visit: Payer: Self-pay | Admitting: Internal Medicine

## 2014-01-23 ENCOUNTER — Other Ambulatory Visit: Payer: Self-pay | Admitting: Internal Medicine

## 2014-02-04 ENCOUNTER — Other Ambulatory Visit: Payer: Self-pay | Admitting: Internal Medicine

## 2014-02-11 ENCOUNTER — Other Ambulatory Visit (INDEPENDENT_AMBULATORY_CARE_PROVIDER_SITE_OTHER): Payer: Medicare Other

## 2014-02-11 ENCOUNTER — Telehealth: Payer: Self-pay | Admitting: *Deleted

## 2014-02-11 ENCOUNTER — Encounter: Payer: Self-pay | Admitting: Internal Medicine

## 2014-02-11 DIAGNOSIS — D649 Anemia, unspecified: Secondary | ICD-10-CM

## 2014-02-11 DIAGNOSIS — E7800 Pure hypercholesterolemia, unspecified: Secondary | ICD-10-CM

## 2014-02-11 DIAGNOSIS — M810 Age-related osteoporosis without current pathological fracture: Secondary | ICD-10-CM | POA: Diagnosis not present

## 2014-02-11 DIAGNOSIS — I10 Essential (primary) hypertension: Secondary | ICD-10-CM

## 2014-02-11 DIAGNOSIS — E119 Type 2 diabetes mellitus without complications: Secondary | ICD-10-CM | POA: Diagnosis not present

## 2014-02-11 DIAGNOSIS — E039 Hypothyroidism, unspecified: Secondary | ICD-10-CM

## 2014-02-11 LAB — CBC WITH DIFFERENTIAL/PLATELET
BASOS ABS: 0.1 10*3/uL (ref 0.0–0.1)
Basophils Relative: 0.7 % (ref 0.0–3.0)
EOS ABS: 0.1 10*3/uL (ref 0.0–0.7)
Eosinophils Relative: 1.3 % (ref 0.0–5.0)
HCT: 35.6 % — ABNORMAL LOW (ref 36.0–46.0)
Hemoglobin: 11.4 g/dL — ABNORMAL LOW (ref 12.0–15.0)
LYMPHS PCT: 24.5 % (ref 12.0–46.0)
Lymphs Abs: 2 10*3/uL (ref 0.7–4.0)
MCHC: 32.2 g/dL (ref 30.0–36.0)
MCV: 78.5 fl (ref 78.0–100.0)
MONOS PCT: 7.1 % (ref 3.0–12.0)
Monocytes Absolute: 0.6 10*3/uL (ref 0.1–1.0)
Neutro Abs: 5.5 10*3/uL (ref 1.4–7.7)
Neutrophils Relative %: 66.4 % (ref 43.0–77.0)
PLATELETS: 279 10*3/uL (ref 150.0–400.0)
RBC: 4.53 Mil/uL (ref 3.87–5.11)
RDW: 17 % — ABNORMAL HIGH (ref 11.5–15.5)
WBC: 8.3 10*3/uL (ref 4.0–10.5)

## 2014-02-11 LAB — HEPATIC FUNCTION PANEL
ALT: 16 U/L (ref 0–35)
AST: 15 U/L (ref 0–37)
Albumin: 4.4 g/dL (ref 3.5–5.2)
Alkaline Phosphatase: 44 U/L (ref 39–117)
Bilirubin, Direct: 0.1 mg/dL (ref 0.0–0.3)
Total Bilirubin: 0.4 mg/dL (ref 0.2–1.2)
Total Protein: 7.2 g/dL (ref 6.0–8.3)

## 2014-02-11 LAB — LIPID PANEL
Cholesterol: 149 mg/dL (ref 0–200)
HDL: 48.9 mg/dL (ref >39.00)
LDL CALC: 85 mg/dL (ref 0–99)
NONHDL: 100.1
Total CHOL/HDL Ratio: 3
Triglycerides: 78 mg/dL (ref 0.0–149.0)
VLDL: 15.6 mg/dL (ref 0.0–40.0)

## 2014-02-11 LAB — BASIC METABOLIC PANEL
BUN: 17 mg/dL (ref 6–23)
CO2: 26 mEq/L (ref 19–32)
Calcium: 9.7 mg/dL (ref 8.4–10.5)
Chloride: 105 mEq/L (ref 96–112)
Creatinine, Ser: 0.74 mg/dL (ref 0.40–1.20)
GFR: 97.91 mL/min (ref >60.00)
GLUCOSE: 90 mg/dL (ref 70–99)
Potassium: 4.8 mEq/L (ref 3.5–5.1)
SODIUM: 138 meq/L (ref 135–145)

## 2014-02-11 LAB — FERRITIN: FERRITIN: 127.4 ng/mL (ref 10.0–291.0)

## 2014-02-11 LAB — VITAMIN D 25 HYDROXY (VIT D DEFICIENCY, FRACTURES): VITD: 38.65 ng/mL (ref 30.00–100.00)

## 2014-02-11 LAB — HEMOGLOBIN A1C: HEMOGLOBIN A1C: 6.6 % — AB (ref 4.6–6.5)

## 2014-02-11 NOTE — Telephone Encounter (Signed)
What labs and dx?  

## 2014-02-11 NOTE — Telephone Encounter (Signed)
Orders placed for labs

## 2014-02-13 ENCOUNTER — Ambulatory Visit (INDEPENDENT_AMBULATORY_CARE_PROVIDER_SITE_OTHER): Payer: Medicare Other | Admitting: Internal Medicine

## 2014-02-13 ENCOUNTER — Encounter: Payer: Self-pay | Admitting: Internal Medicine

## 2014-02-13 VITALS — BP 120/70 | HR 73 | Temp 98.1°F | Ht 62.5 in | Wt 141.5 lb

## 2014-02-13 DIAGNOSIS — M79606 Pain in leg, unspecified: Secondary | ICD-10-CM

## 2014-02-13 DIAGNOSIS — E7800 Pure hypercholesterolemia, unspecified: Secondary | ICD-10-CM

## 2014-02-13 DIAGNOSIS — D649 Anemia, unspecified: Secondary | ICD-10-CM

## 2014-02-13 DIAGNOSIS — E119 Type 2 diabetes mellitus without complications: Secondary | ICD-10-CM

## 2014-02-13 DIAGNOSIS — E039 Hypothyroidism, unspecified: Secondary | ICD-10-CM

## 2014-02-13 DIAGNOSIS — M810 Age-related osteoporosis without current pathological fracture: Secondary | ICD-10-CM | POA: Diagnosis not present

## 2014-02-13 DIAGNOSIS — Z23 Encounter for immunization: Secondary | ICD-10-CM | POA: Diagnosis not present

## 2014-02-13 DIAGNOSIS — Z0000 Encounter for general adult medical examination without abnormal findings: Secondary | ICD-10-CM

## 2014-02-13 DIAGNOSIS — I10 Essential (primary) hypertension: Secondary | ICD-10-CM | POA: Diagnosis not present

## 2014-02-13 MED ORDER — PRAVASTATIN SODIUM 40 MG PO TABS: 40.0000 mg | ORAL_TABLET | Freq: Every day | ORAL | Status: DC

## 2014-02-13 NOTE — Progress Notes (Signed)
Pre visit review using our clinic review tool, if applicable. No additional management support is needed unless otherwise documented below in the visit note. 

## 2014-02-13 NOTE — Progress Notes (Signed)
[name], female   DOB: Feb 03, 1935, 79 y.o.   MRN: [id number]   Subjective:    [name], female    DOB: May 10, 1935, 79 y.o.   MRN: [id number]  HPI  Patient here for a scheduled physical.  Has a history of hypertension, hypothyroidism and diabetes.  She is watching her diet.  Sugar is better.  No cardiac symptoms with increased activity or exertion.  Increased pain - posterior legs (popliteal region).  Knee supports help some.  Persistent, worsening pain.  Hurts daily.  No radiation of pain.  Riding her bike worsens the pain.  Takes tylenol.  Want to avoid increased anitiinflammatories.     Past Medical History  Diagnosis Date  . Diabetes mellitus   . Hypothyroidism   . Hypercholesterolemia   . Osteoporosis   . Anemia   . Hypertension     Current Outpatient Prescriptions on File Prior to Visit  Medication Sig Dispense Refill  . aspirin 81 MG tablet Take 81 mg by mouth daily.    . calcium-vitamin D (CALCIUM 500+D) 500-200 MG-UNIT per tablet Take 1 tablet by mouth daily.    Marland Kitchen GLIPIZIDE XL 5 MG 24 hr tablet TAKE 1 TABLET DAILY 90 tablet 1  . glucose blood (ACCU-CHEK COMPACT PLUS) test strip CHECK ONCE DAILY 100 each 2  . lisinopril (PRINIVIL,ZESTRIL) 10 MG tablet TAKE 1 TABLET DAILY 90 tablet 1  . metFORMIN (GLUCOPHAGE) 500 MG tablet TAKE 2 TABLETS TWICE A DAY 360 tablet 2  . Multiple Vitamin (MULTIVITAMIN) tablet Take 1 tablet by mouth daily.    Marland Kitchen omeprazole (PRILOSEC) 20 MG capsule TAKE 1 CAPSULE DAILY 90 capsule 1   No current facility-administered medications on file prior to visit.    Review of Systems  Constitutional: Negative for fatigue and unexpected weight change.  HENT: Negative for congestion, sinus pressure and sore throat.   Eyes: Negative for pain and visual disturbance.  Respiratory: Negative for cough, chest tightness and shortness of breath.   Cardiovascular: Negative for chest pain and palpitations.  Gastrointestinal: Negative  for abdominal pain, diarrhea and constipation.  Genitourinary: Negative for frequency and difficulty urinating.  Musculoskeletal: Negative for back pain and joint swelling.       Increased, worsening pain - posterior leg.  Occurs daily.   Skin: Negative for color change and rash.  Neurological: Negative for dizziness, light-headedness and headaches.  Hematological: Negative for adenopathy. Does not bruise/bleed easily.  Psychiatric/Behavioral: Negative for dysphoric mood and decreased concentration.       Objective:    Physical Exam  Constitutional: She is oriented to person, place, and time. She appears well-developed and well-nourished.  HENT:  Nose: Nose normal.  Mouth/Throat: Oropharynx is clear and moist.  Eyes: Right eye exhibits no discharge. Left eye exhibits no discharge. No scleral icterus.  Neck: Neck supple. No thyromegaly present.  Cardiovascular: Normal rate and regular rhythm.   Pulmonary/Chest: Breath sounds normal. No accessory muscle usage. No tachypnea. No respiratory distress. She has no decreased breath sounds. She has no wheezes. She has no rhonchi. Right breast exhibits no inverted nipple, no mass, no nipple discharge and no tenderness (no axillary adenopathy). Left breast exhibits no inverted nipple, no mass, no nipple discharge and no tenderness (no axilarry adenopathy).  Abdominal: Soft. Bowel sounds are normal. There is no tenderness.  Musculoskeletal: She exhibits no edema or tenderness.  DP pulses palpable and equal bilaterally.  No pain to palpation over the posterior calf or popliteal region.  Lymphadenopathy:    She has no cervical adenopathy.  Neurological: She is alert and oriented to person, place, and time.  Skin: Skin is warm. No rash noted.  Psychiatric: She has a normal mood and affect. Her behavior is normal.    BP 120/70 mmHg  Pulse 73  Temp(Src) 98.1 F (36.7 C) (Oral)  Ht 5' 2.5" (1.588 m)  Wt 141 lb 8 oz (64.184 kg)  BMI 25.45 kg/m2   SpO2 96% Wt Readings from Last 3 Encounters:  02/13/14 141 lb 8 oz (64.184 kg)  10/13/13 143 lb 8 oz (65.091 kg)  06/12/13 140 lb 12 oz (63.844 kg)     Lab Results  Component Value Date   WBC 8.3 02/11/2014   HGB 11.4* 02/11/2014   HCT 35.6* 02/11/2014   PLT 279.0 02/11/2014   GLUCOSE 90 02/11/2014   CHOL 149 02/11/2014   TRIG 78.0 02/11/2014   HDL 48.90 02/11/2014   LDLCALC 85 02/11/2014   ALT 16 02/11/2014   AST 15 02/11/2014   NA 138 02/11/2014   K 4.8 02/11/2014   CL 105 02/11/2014   CREATININE 0.74 02/11/2014   BUN 17 02/11/2014   CO2 26 02/11/2014   TSH 0.52 10/10/2013   HGBA1C 6.6* 02/11/2014   MICROALBUR 1.6 01/20/2013    No results found.     Assessment & Plan:   Problem List Items Addressed This Visit    Anemia    EGD 04/22/12 - gastritis.  Colonoscopy 04/22/12 - redundant colon, non bleeding internal hemorrhoids.  Recent hgb wnl.        Diabetes mellitus    Sugars improved.  Has adjusted her diet.  Follow met b and a1c.        Relevant Medications   pravastatin (PRAVACHOL) tablet   Health care maintenance    Colonoscopy as outlined.  Physical today.  Scheduled for her mammogram 02/16/14.        Hypercholesterolemia    Low cholesterol diet and exercise.  On pravastatin.  Follow lipid panel and liver function tests.        Relevant Medications   pravastatin (PRAVACHOL) tablet   Hypertension - Primary    Blood pressure doing well.  Same medication regimen.  Follow metabolic panel.       Relevant Medications   pravastatin (PRAVACHOL) tablet   Hypothyroidism    Follow tsh.       Leg pain    Persistent.  Worsening.  Daily.  Unclear etiology.  Localized to the posterior leg.  Hold on xray.  Has tried supports.  Will have Dr Jefm Bryant evaluate.  Do not think related to cholesterol medication.        Relevant Orders   Ambulatory referral to Rheumatology   Osteoporosis    Bone density 12/27/09 normal.  Off fosamax.  Continue vitamin D.         Other Visit Diagnoses    Need for prophylactic vaccination against Streptococcus pneumoniae (pneumococcus)        Relevant Orders    Pneumococcal conjugate vaccine 13-valent (Completed)      I spent 25 minutes with the patient and more than 50% of the time was spent in consultation regarding the above.     Einar Pheasant, MD

## 2014-02-15 ENCOUNTER — Encounter: Payer: Self-pay | Admitting: Internal Medicine

## 2014-02-15 DIAGNOSIS — Z0000 Encounter for general adult medical examination without abnormal findings: Secondary | ICD-10-CM | POA: Insufficient documentation

## 2014-02-15 NOTE — Assessment & Plan Note (Signed)
Sugars improved.  Has adjusted her diet.  Follow met b and a1c.

## 2014-02-15 NOTE — Assessment & Plan Note (Signed)
EGD 04/22/12 - gastritis.  Colonoscopy 04/22/12 - redundant colon, non bleeding internal hemorrhoids.  Recent hgb wnl.

## 2014-02-15 NOTE — Assessment & Plan Note (Signed)
Low cholesterol diet and exercise.  On pravastatin.  Follow lipid panel and liver function tests.   

## 2014-02-15 NOTE — Assessment & Plan Note (Signed)
Persistent.  Worsening.  Daily.  Unclear etiology.  Localized to the posterior leg.  Hold on xray.  Has tried supports.  Will have Dr Jefm Bryant evaluate.  Do not think related to cholesterol medication.

## 2014-02-15 NOTE — Assessment & Plan Note (Signed)
Bone density 12/27/09 normal.  Off fosamax.  Continue vitamin D.

## 2014-02-15 NOTE — Assessment & Plan Note (Signed)
Colonoscopy as outlined.  Physical today.  Scheduled for her mammogram 02/16/14.

## 2014-02-15 NOTE — Assessment & Plan Note (Signed)
Follow tsh.  

## 2014-02-15 NOTE — Assessment & Plan Note (Signed)
Blood pressure doing well.  Same medication regimen.  Follow metabolic panel.   

## 2014-02-16 ENCOUNTER — Ambulatory Visit: Payer: Self-pay | Admitting: Internal Medicine

## 2014-02-16 ENCOUNTER — Encounter: Payer: Self-pay | Admitting: *Deleted

## 2014-02-16 DIAGNOSIS — Z1231 Encounter for screening mammogram for malignant neoplasm of breast: Secondary | ICD-10-CM | POA: Diagnosis not present

## 2014-02-16 LAB — HM MAMMOGRAPHY: HM Mammogram: NEGATIVE

## 2014-03-31 DIAGNOSIS — M545 Low back pain: Secondary | ICD-10-CM | POA: Diagnosis not present

## 2014-03-31 DIAGNOSIS — M791 Myalgia: Secondary | ICD-10-CM | POA: Diagnosis not present

## 2014-03-31 DIAGNOSIS — M25562 Pain in left knee: Secondary | ICD-10-CM | POA: Diagnosis not present

## 2014-03-31 DIAGNOSIS — M25561 Pain in right knee: Secondary | ICD-10-CM | POA: Diagnosis not present

## 2014-03-31 DIAGNOSIS — G8929 Other chronic pain: Secondary | ICD-10-CM | POA: Diagnosis not present

## 2014-04-14 DIAGNOSIS — M25561 Pain in right knee: Secondary | ICD-10-CM | POA: Diagnosis not present

## 2014-04-14 DIAGNOSIS — M791 Myalgia: Secondary | ICD-10-CM | POA: Diagnosis not present

## 2014-04-14 DIAGNOSIS — G8929 Other chronic pain: Secondary | ICD-10-CM | POA: Diagnosis not present

## 2014-04-16 IMAGING — US THYROID ULTRASOUND
1 series · 14 of 25 positions shown · non-contrast
Comparison: none

REASON FOR EXAM: thyroid nodule
COMMENTS:

[Series 1: thyroid ultrasound · 0.08mm/px · 14 of 67 slices shown]
[im 1/67]
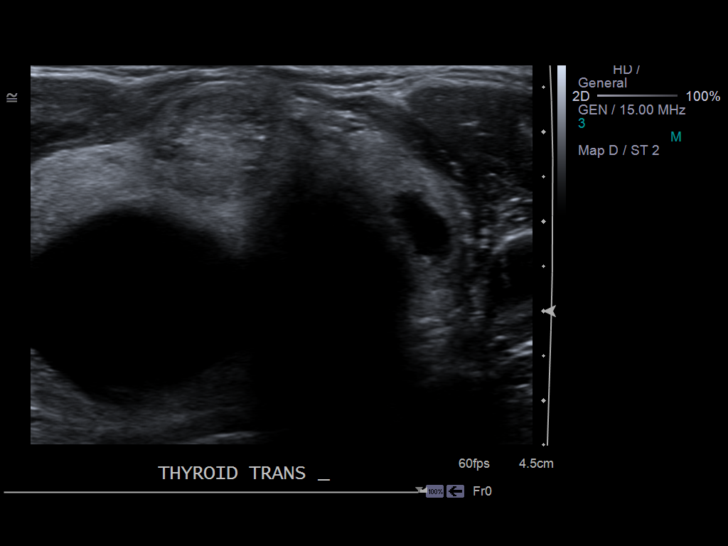
[im 6/67]
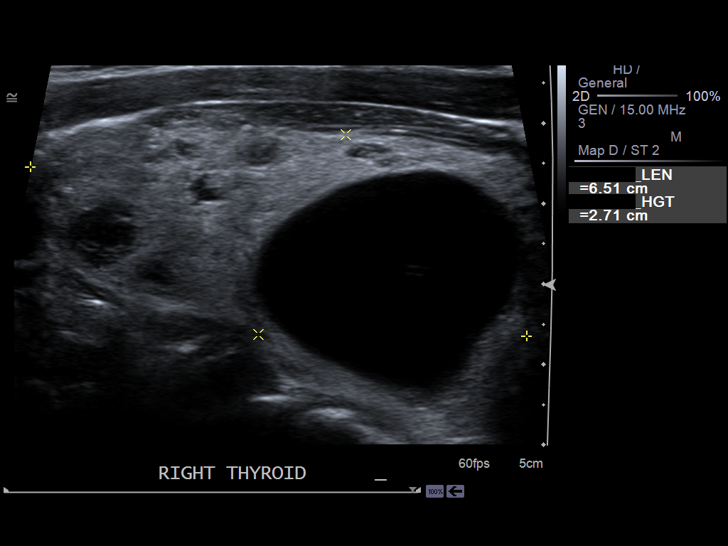
[im 12/67]
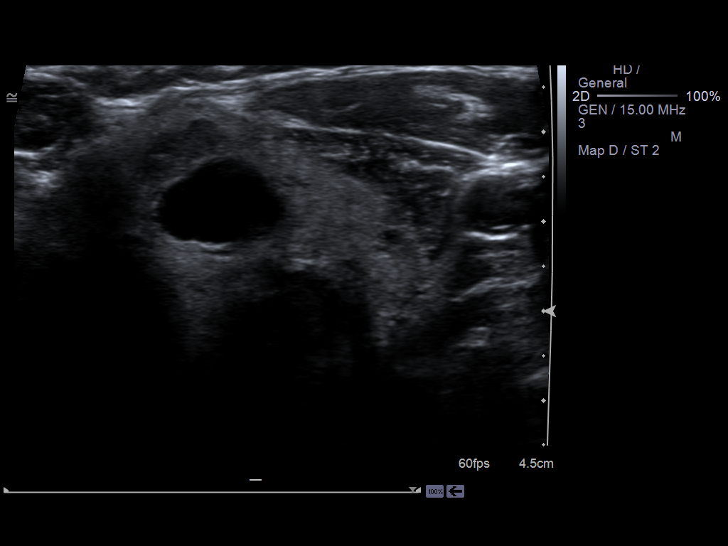
[im 17/67]
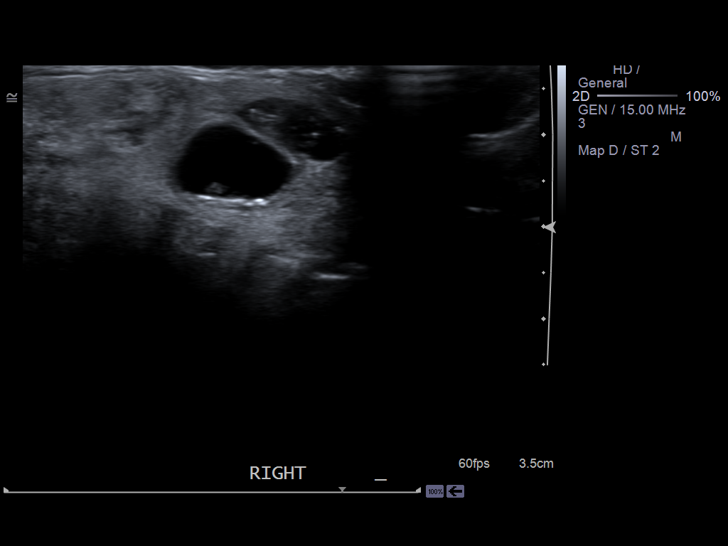
[im 23/67]
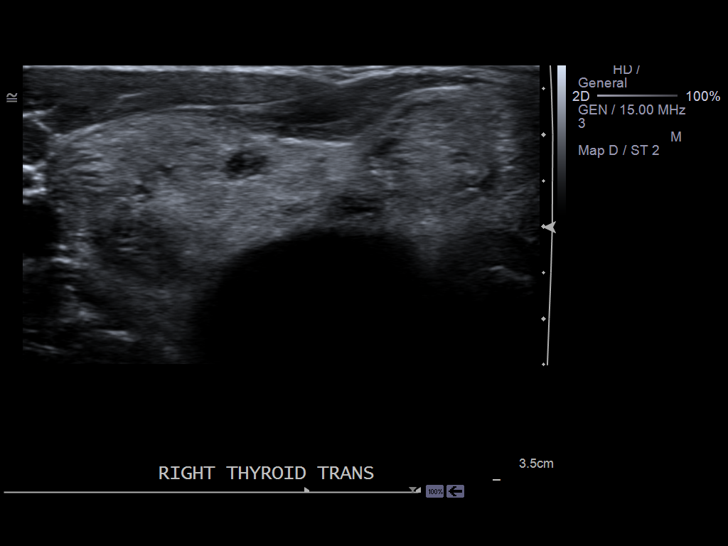
[im 25/67]
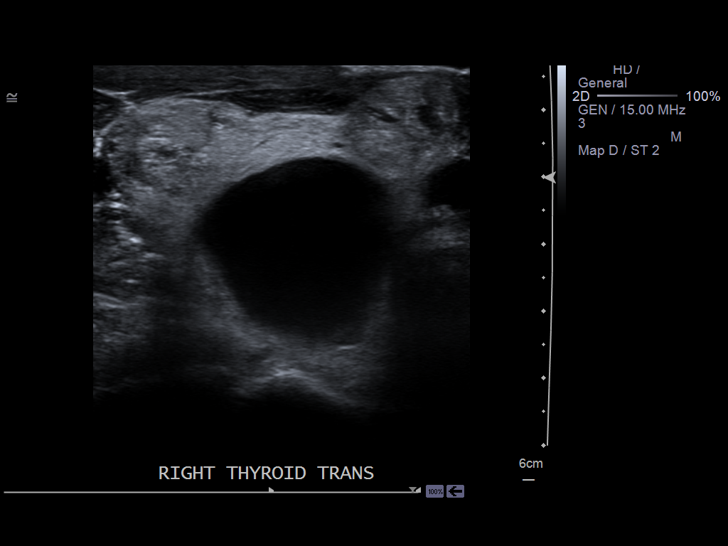
[im 31/67]
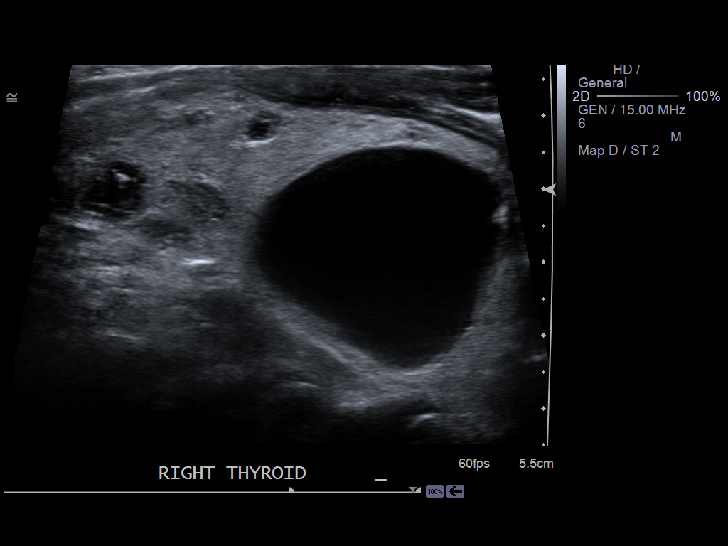
[im 36/67]
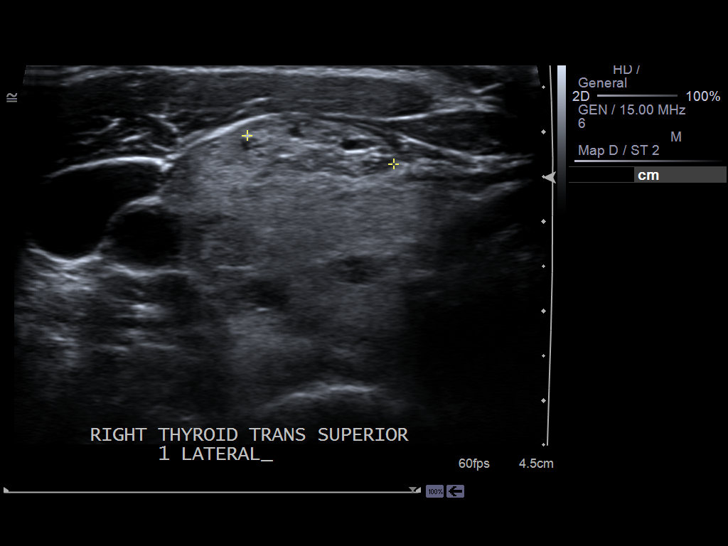
[im 42/67]
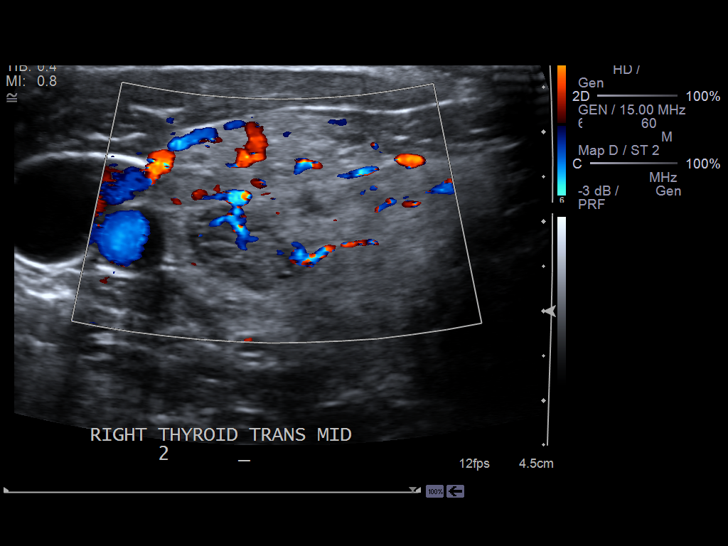
[im 45/67]
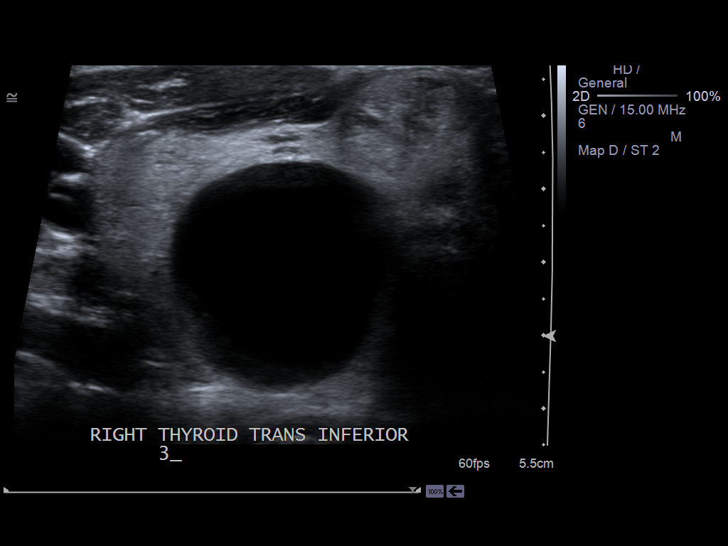
[im 50/67]
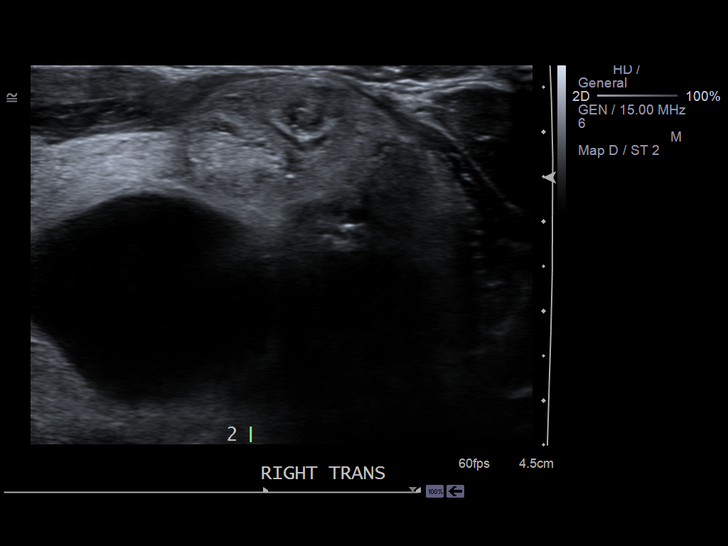
[im 56/67]
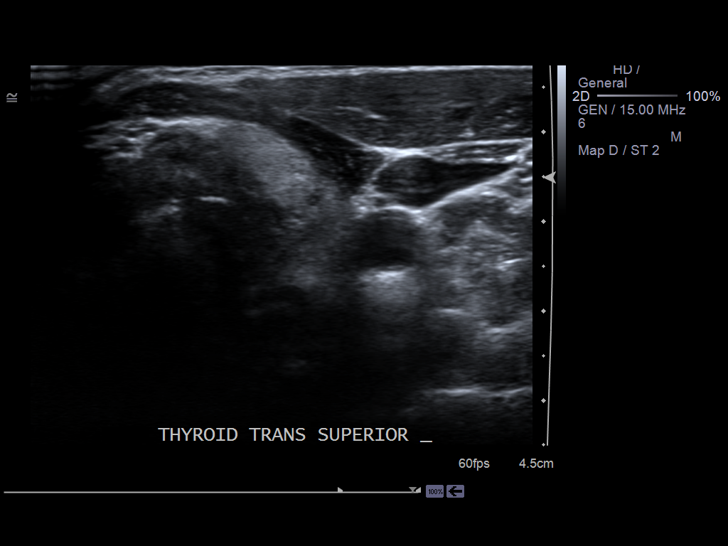
[im 61/67]
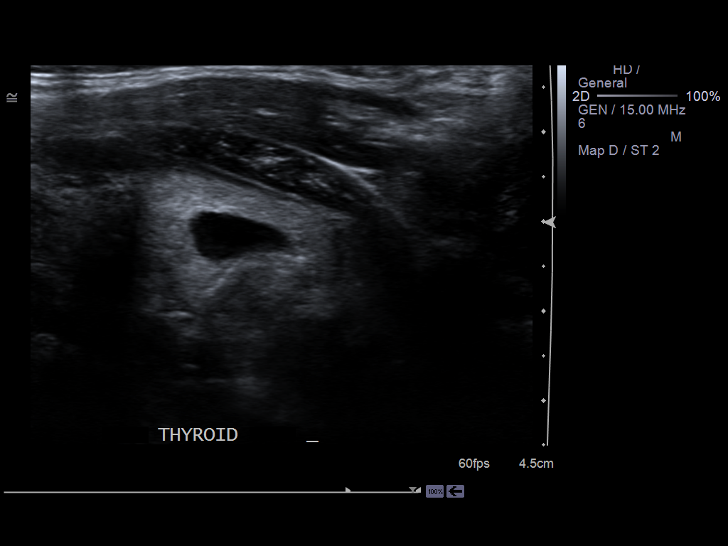
[im 67/67]
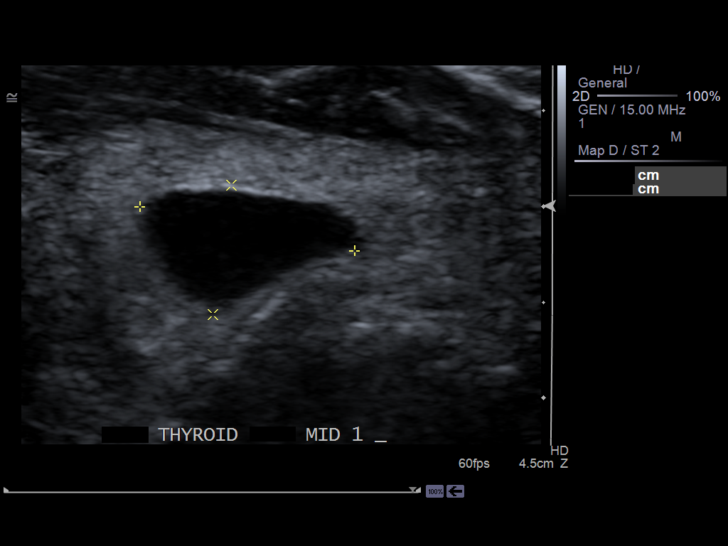

[14 of 25 positions shown; findings below may reference images not displayed]

PROCEDURE:     US  - US SOFT TISSUE HEAD/NECK/THYROID  - August 29, 2011  [DATE]

RESULT:     The patient is undergoing evaluation of nodularity in the
thyroid gland.

On the right thyroid lobe measures 6.5 a 2.7 by 3.6 cm. There are stable
isoechoic nodules in the upper and midpole. One measures 2 x 1 x 1.7 cm of
the midpole nodule measures 1.3 to 0.7 x 1 cm. A cyst in the lower pole
measures 3.5 x 3 x 3 centimeters and has increased in size since the
previous study.

Within the thyroid isthmus there is a stable cyst measuring 1.6 x 0.8 x
cm. There is a nodule measuring 2.3 x 1.4 x 1.9 cm which has appeared since
the previous study. The thyroid isthmus itself measures 1.3 centimeters in
thickness.

The left thyroid lobe measures 3.5 x 1.5 x 1.3 cm and contains a stable cyst
measuring 1.1 x 0.7 x 1.1 cm.
IMPRESSION: 1. There is a new nodule in the thyroid isthmus measuring 2.3 x 1.4 x 1.9 cm.
2. There is a stable cyst in the left thyroid lobe and a cyst in the right
thyroid lobe has increased in size.
3. There are stable nodules in the right thyroid lobe.

[REDACTED]

## 2014-05-21 ENCOUNTER — Encounter: Payer: Self-pay | Admitting: *Deleted

## 2014-06-01 ENCOUNTER — Other Ambulatory Visit: Payer: Self-pay | Admitting: Internal Medicine

## 2014-06-12 ENCOUNTER — Other Ambulatory Visit (INDEPENDENT_AMBULATORY_CARE_PROVIDER_SITE_OTHER): Payer: Medicare Other

## 2014-06-12 DIAGNOSIS — E039 Hypothyroidism, unspecified: Secondary | ICD-10-CM

## 2014-06-12 DIAGNOSIS — E7800 Pure hypercholesterolemia, unspecified: Secondary | ICD-10-CM

## 2014-06-12 DIAGNOSIS — E119 Type 2 diabetes mellitus without complications: Secondary | ICD-10-CM

## 2014-06-12 DIAGNOSIS — D649 Anemia, unspecified: Secondary | ICD-10-CM | POA: Diagnosis not present

## 2014-06-12 LAB — BASIC METABOLIC PANEL
BUN: 22 mg/dL (ref 6–23)
CO2: 27 meq/L (ref 19–32)
CREATININE: 0.84 mg/dL (ref 0.40–1.20)
Calcium: 9.8 mg/dL (ref 8.4–10.5)
Chloride: 103 mEq/L (ref 96–112)
GFR: 84.51 mL/min (ref >60.00)
Glucose, Bld: 85 mg/dL (ref 70–99)
POTASSIUM: 4.8 meq/L (ref 3.5–5.1)
Sodium: 136 mEq/L (ref 135–145)

## 2014-06-12 LAB — CBC WITH DIFFERENTIAL/PLATELET
Basophils Absolute: 0.1 10*3/uL (ref 0.0–0.1)
Basophils Relative: 0.9 % (ref 0.0–3.0)
EOS ABS: 0.1 10*3/uL (ref 0.0–0.7)
EOS PCT: 1.3 % (ref 0.0–5.0)
HEMATOCRIT: 36 % (ref 36.0–46.0)
HEMOGLOBIN: 11.6 g/dL — AB (ref 12.0–15.0)
Lymphocytes Relative: 26.5 % (ref 12.0–46.0)
Lymphs Abs: 2.2 10*3/uL (ref 0.7–4.0)
MCHC: 32.1 g/dL (ref 30.0–36.0)
MCV: 77.8 fl — AB (ref 78.0–100.0)
MONO ABS: 0.6 10*3/uL (ref 0.1–1.0)
Monocytes Relative: 7.2 % (ref 3.0–12.0)
NEUTROS PCT: 64.1 % (ref 43.0–77.0)
Neutro Abs: 5.3 10*3/uL (ref 1.4–7.7)
Platelets: 321 10*3/uL (ref 150.0–400.0)
RBC: 4.62 Mil/uL (ref 3.87–5.11)
RDW: 17.1 % — ABNORMAL HIGH (ref 11.5–15.5)
WBC: 8.2 10*3/uL (ref 4.0–10.5)

## 2014-06-12 LAB — HEPATIC FUNCTION PANEL
ALT: 17 U/L (ref 0–35)
AST: 20 U/L (ref 0–37)
Albumin: 4.3 g/dL (ref 3.5–5.2)
Alkaline Phosphatase: 47 U/L (ref 39–117)
BILIRUBIN TOTAL: 0.5 mg/dL (ref 0.2–1.2)
Bilirubin, Direct: 0.1 mg/dL (ref 0.0–0.3)
Total Protein: 7.5 g/dL (ref 6.0–8.3)

## 2014-06-12 LAB — LIPID PANEL
CHOLESTEROL: 198 mg/dL (ref 0–200)
HDL: 52 mg/dL (ref >39.00)
LDL Cholesterol: 132 mg/dL — ABNORMAL HIGH (ref 0–99)
NonHDL: 146
Total CHOL/HDL Ratio: 4
Triglycerides: 68 mg/dL (ref 0.0–149.0)
VLDL: 13.6 mg/dL (ref 0.0–40.0)

## 2014-06-12 LAB — MICROALBUMIN / CREATININE URINE RATIO
Creatinine,U: 119.2 mg/dL
Microalb Creat Ratio: 1.1 mg/g (ref 0.0–30.0)
Microalb, Ur: 1.3 mg/dL (ref 0.0–1.9)

## 2014-06-12 LAB — FERRITIN: Ferritin: 116.9 ng/mL (ref 10.0–291.0)

## 2014-06-12 LAB — TSH: TSH: 0.44 u[IU]/mL (ref 0.35–4.50)

## 2014-06-12 LAB — HEMOGLOBIN A1C: HEMOGLOBIN A1C: 6.5 % (ref 4.6–6.5)

## 2014-06-12 NOTE — Addendum Note (Signed)
Addended by: Johnsie Cancel on: 06/12/2014 09:19 AM   Modules accepted: Orders

## 2014-06-14 ENCOUNTER — Encounter: Payer: Self-pay | Admitting: Internal Medicine

## 2014-06-15 ENCOUNTER — Ambulatory Visit (INDEPENDENT_AMBULATORY_CARE_PROVIDER_SITE_OTHER): Payer: Medicare Other | Admitting: Internal Medicine

## 2014-06-15 ENCOUNTER — Encounter: Payer: Self-pay | Admitting: Internal Medicine

## 2014-06-15 VITALS — BP 103/64 | HR 66 | Temp 98.0°F | Ht 62.5 in | Wt 141.1 lb

## 2014-06-15 DIAGNOSIS — D649 Anemia, unspecified: Secondary | ICD-10-CM

## 2014-06-15 DIAGNOSIS — M79606 Pain in leg, unspecified: Secondary | ICD-10-CM

## 2014-06-15 DIAGNOSIS — Z0000 Encounter for general adult medical examination without abnormal findings: Secondary | ICD-10-CM

## 2014-06-15 DIAGNOSIS — I10 Essential (primary) hypertension: Secondary | ICD-10-CM | POA: Diagnosis not present

## 2014-06-15 DIAGNOSIS — E7800 Pure hypercholesterolemia, unspecified: Secondary | ICD-10-CM

## 2014-06-15 DIAGNOSIS — E119 Type 2 diabetes mellitus without complications: Secondary | ICD-10-CM

## 2014-06-15 NOTE — Progress Notes (Signed)
Pre visit review using our clinic review tool, if applicable. No additional management support is needed unless otherwise documented below in the visit note. 

## 2014-06-15 NOTE — Progress Notes (Signed)
[name], female   DOB: 1935-03-06, 79 y.o.   MRN: [id number]   Subjective:    [name], female    DOB: 1935-02-06, 79 y.o.   MRN: [id number]  HPI  Patient here for a scheduled follow up.  Has been having problems with persistent pain in her legs.  Saw Dr Jefm Bryant.  He stopped her cholesterol medication.  States pain is better now.  Able to stand and cook, etc.  Tries to stay active.  Reports no cardiac symptoms with increased activity or exertion.  Breathing stable.  No acid reflux reported.  No nausea or vomiting.  Bowels stable.  Sugars doing well.  a1c just checked - 6.5.     Past Medical History  Diagnosis Date  . Diabetes mellitus   . Hypothyroidism   . Hypercholesterolemia   . Osteoporosis   . Anemia   . Hypertension     Current Outpatient Prescriptions on File Prior to Visit  Medication Sig Dispense Refill  . aspirin 81 MG tablet Take 81 mg by mouth daily.    . calcium-vitamin D (CALCIUM 500+D) 500-200 MG-UNIT per tablet Take 1 tablet by mouth daily.    Marland Kitchen GLIPIZIDE XL 5 MG 24 hr tablet TAKE 1 TABLET DAILY 90 tablet 1  . glucose blood (ACCU-CHEK COMPACT PLUS) test strip CHECK ONCE DAILY 100 each 2  . lisinopril (PRINIVIL,ZESTRIL) 10 MG tablet TAKE 1 TABLET DAILY 90 tablet 1  . metFORMIN (GLUCOPHAGE) 500 MG tablet TAKE 2 TABLETS TWICE A DAY 360 tablet 2  . Multiple Vitamin (MULTIVITAMIN) tablet Take 1 tablet by mouth daily.    Marland Kitchen omeprazole (PRILOSEC) 20 MG capsule TAKE 1 CAPSULE DAILY 90 capsule 1   No current facility-administered medications on file prior to visit.    Review of Systems  Constitutional: Negative for appetite change and unexpected weight change.  HENT: Negative for congestion and sinus pressure.   Respiratory: Negative for cough, chest tightness and shortness of breath.   Cardiovascular: Negative for chest pain, palpitations and leg swelling.  Gastrointestinal: Negative for nausea, vomiting, abdominal pain and diarrhea.   Musculoskeletal:       The previous leg aching is better.    Skin: Negative for color change and rash.  Neurological: Negative for dizziness, light-headedness and headaches.  Psychiatric/Behavioral: Negative for dysphoric mood and agitation.       Objective:    Physical Exam  Constitutional: She appears well-developed and well-nourished. No distress.  HENT:  Nose: Nose normal.  Mouth/Throat: Oropharynx is clear and moist.  Neck: Neck supple. No thyromegaly present.  Cardiovascular: Normal rate and regular rhythm.   Pulmonary/Chest: Breath sounds normal. No respiratory distress. She has no wheezes.  Abdominal: Soft. Bowel sounds are normal. There is no tenderness.  Musculoskeletal: She exhibits no edema or tenderness.  Lymphadenopathy:    She has no cervical adenopathy.  Skin: No rash noted. No erythema.  Psychiatric: She has a normal mood and affect. Her behavior is normal.    BP 103/64 mmHg  Pulse 66  Temp(Src) 98 F (36.7 C) (Oral)  Ht 5' 2.5" (1.588 m)  Wt 141 lb 2 oz (64.014 kg)  BMI 25.38 kg/m2  SpO2 99% Wt Readings from Last 3 Encounters:  06/15/14 141 lb 2 oz (64.014 kg)  02/13/14 141 lb 8 oz (64.184 kg)  10/13/13 143 lb 8 oz (65.091 kg)     Lab Results  Component Value Date   WBC 8.2 06/12/2014   HGB 11.6* 06/12/2014  HCT 36.0 06/12/2014   PLT 321.0 06/12/2014   GLUCOSE 85 06/12/2014   CHOL 198 06/12/2014   TRIG 68.0 06/12/2014   HDL 52.00 06/12/2014   LDLCALC 132* 06/12/2014   ALT 17 06/12/2014   AST 20 06/12/2014   NA 136 06/12/2014   K 4.8 06/12/2014   CL 103 06/12/2014   CREATININE 0.84 06/12/2014   BUN 22 06/12/2014   CO2 27 06/12/2014   TSH 0.44 06/12/2014   HGBA1C 6.5 06/12/2014   MICROALBUR 1.3 06/12/2014       Assessment & Plan:   Problem List Items Addressed This Visit    Anemia    Had EGD and colonoscopy 04/22/12 as outlined.  Follow cbc.  hgb just checked - 11.6.  Stable.        Relevant Orders   CBC with  Differential/Platelet   Ferritin   Diabetes mellitus    Trying to watch her diet.  Trying to stay active.  Sugars doing well.  a1c just checked 6.5.  Follow met b and a1c.       Relevant Orders   Hemoglobin Y5K   Basic metabolic panel   Health care maintenance    Physical 02/13/14.  Colonoscopy 04/22/12.  Mammogram 02/16/14 - BiradsI.       Hypercholesterolemia    Pravastatin stopped recently secondary to leg aching.  Low cholesterol diet and exercise.  Follow cholesterol.        Relevant Orders   Lipid panel   Hepatic function panel   Hypertension - Primary    Blood pressure have been doing well.  Same medication regimen.  Follow pressures.  Follow metabolic panel.        Leg pain    Resolved with stopping cholesterol medication.  Saw Dr Jefm Bryant.           Einar Pheasant, MD

## 2014-06-21 ENCOUNTER — Encounter: Payer: Self-pay | Admitting: Internal Medicine

## 2014-06-21 NOTE — Assessment & Plan Note (Signed)
Trying to watch her diet.  Trying to stay active.  Sugars doing well.  a1c just checked 6.5.  Follow met b and a1c.

## 2014-06-21 NOTE — Assessment & Plan Note (Signed)
Pravastatin stopped recently secondary to leg aching.  Low cholesterol diet and exercise.  Follow cholesterol.

## 2014-06-21 NOTE — Assessment & Plan Note (Signed)
Physical 02/13/14.  Colonoscopy 04/22/12.  Mammogram 02/16/14 - BiradsI.

## 2014-06-21 NOTE — Assessment & Plan Note (Signed)
Blood pressure have been doing well.  Same medication regimen.  Follow pressures.  Follow metabolic panel.

## 2014-06-21 NOTE — Assessment & Plan Note (Signed)
Had EGD and colonoscopy 04/22/12 as outlined.  Follow cbc.  hgb just checked - 11.6.  Stable.

## 2014-06-21 NOTE — Assessment & Plan Note (Signed)
Resolved with stopping cholesterol medication.  Saw Dr Jefm Bryant.

## 2014-07-05 ENCOUNTER — Other Ambulatory Visit: Payer: Self-pay | Admitting: Internal Medicine

## 2014-09-09 ENCOUNTER — Other Ambulatory Visit: Payer: Self-pay | Admitting: Internal Medicine

## 2014-10-09 ENCOUNTER — Other Ambulatory Visit: Payer: Medicare Other

## 2014-10-12 ENCOUNTER — Other Ambulatory Visit: Payer: Medicare Other

## 2014-10-12 ENCOUNTER — Other Ambulatory Visit (INDEPENDENT_AMBULATORY_CARE_PROVIDER_SITE_OTHER): Payer: Medicare Other

## 2014-10-12 DIAGNOSIS — D649 Anemia, unspecified: Secondary | ICD-10-CM | POA: Diagnosis not present

## 2014-10-12 DIAGNOSIS — E119 Type 2 diabetes mellitus without complications: Secondary | ICD-10-CM | POA: Diagnosis not present

## 2014-10-12 DIAGNOSIS — E7800 Pure hypercholesterolemia, unspecified: Secondary | ICD-10-CM | POA: Diagnosis not present

## 2014-10-12 LAB — CBC WITH DIFFERENTIAL/PLATELET
BASOS PCT: 0.7 % (ref 0.0–3.0)
Basophils Absolute: 0.1 10*3/uL (ref 0.0–0.1)
Eosinophils Absolute: 0.1 10*3/uL (ref 0.0–0.7)
Eosinophils Relative: 1.6 % (ref 0.0–5.0)
HCT: 35.8 % — ABNORMAL LOW (ref 36.0–46.0)
Hemoglobin: 11.2 g/dL — ABNORMAL LOW (ref 12.0–15.0)
Lymphocytes Relative: 25.6 % (ref 12.0–46.0)
Lymphs Abs: 2.2 10*3/uL (ref 0.7–4.0)
MCHC: 31.4 g/dL (ref 30.0–36.0)
MCV: 79 fl (ref 78.0–100.0)
MONOS PCT: 7.8 % (ref 3.0–12.0)
Monocytes Absolute: 0.7 10*3/uL (ref 0.1–1.0)
NEUTROS ABS: 5.5 10*3/uL (ref 1.4–7.7)
Neutrophils Relative %: 64.3 % (ref 43.0–77.0)
Platelets: 268 10*3/uL (ref 150.0–400.0)
RBC: 4.53 Mil/uL (ref 3.87–5.11)
RDW: 16.7 % — ABNORMAL HIGH (ref 11.5–15.5)
WBC: 8.6 10*3/uL (ref 4.0–10.5)

## 2014-10-12 LAB — BASIC METABOLIC PANEL
BUN: 18 mg/dL (ref 6–23)
CALCIUM: 9.7 mg/dL (ref 8.4–10.5)
CO2: 24 meq/L (ref 19–32)
CREATININE: 0.78 mg/dL (ref 0.40–1.20)
Chloride: 105 mEq/L (ref 96–112)
GFR: 91.98 mL/min (ref >60.00)
Glucose, Bld: 87 mg/dL (ref 70–99)
Potassium: 4.7 mEq/L (ref 3.5–5.1)
SODIUM: 138 meq/L (ref 135–145)

## 2014-10-12 LAB — FERRITIN: FERRITIN: 113.3 ng/mL (ref 10.0–291.0)

## 2014-10-12 LAB — HEPATIC FUNCTION PANEL
ALK PHOS: 44 U/L (ref 39–117)
ALT: 14 U/L (ref 0–35)
AST: 22 U/L (ref 0–37)
Albumin: 4.2 g/dL (ref 3.5–5.2)
BILIRUBIN TOTAL: 0.3 mg/dL (ref 0.2–1.2)
Bilirubin, Direct: 0.1 mg/dL (ref 0.0–0.3)
Total Protein: 7.7 g/dL (ref 6.0–8.3)

## 2014-10-12 LAB — LIPID PANEL
Cholesterol: 199 mg/dL (ref 0–200)
HDL: 55.1 mg/dL (ref >39.00)
LDL Cholesterol: 131 mg/dL — ABNORMAL HIGH (ref 0–99)
NONHDL: 144.18
Total CHOL/HDL Ratio: 4
Triglycerides: 68 mg/dL (ref 0.0–149.0)
VLDL: 13.6 mg/dL (ref 0.0–40.0)

## 2014-10-12 LAB — HEMOGLOBIN A1C: Hgb A1c MFr Bld: 6.8 % — ABNORMAL HIGH (ref 4.6–6.5)

## 2014-10-13 ENCOUNTER — Encounter: Payer: Self-pay | Admitting: Internal Medicine

## 2014-10-15 ENCOUNTER — Encounter: Payer: Self-pay | Admitting: Internal Medicine

## 2014-10-15 ENCOUNTER — Ambulatory Visit (INDEPENDENT_AMBULATORY_CARE_PROVIDER_SITE_OTHER): Payer: Medicare Other | Admitting: Internal Medicine

## 2014-10-15 VITALS — BP 110/70 | HR 83 | Temp 98.3°F | Resp 17 | Ht 62.5 in | Wt 143.8 lb

## 2014-10-15 DIAGNOSIS — M810 Age-related osteoporosis without current pathological fracture: Secondary | ICD-10-CM

## 2014-10-15 DIAGNOSIS — D649 Anemia, unspecified: Secondary | ICD-10-CM

## 2014-10-15 DIAGNOSIS — Z23 Encounter for immunization: Secondary | ICD-10-CM | POA: Diagnosis not present

## 2014-10-15 DIAGNOSIS — M79606 Pain in leg, unspecified: Secondary | ICD-10-CM

## 2014-10-15 DIAGNOSIS — E119 Type 2 diabetes mellitus without complications: Secondary | ICD-10-CM | POA: Diagnosis not present

## 2014-10-15 DIAGNOSIS — I10 Essential (primary) hypertension: Secondary | ICD-10-CM | POA: Diagnosis not present

## 2014-10-15 DIAGNOSIS — E7800 Pure hypercholesterolemia, unspecified: Secondary | ICD-10-CM | POA: Diagnosis not present

## 2014-10-15 NOTE — Progress Notes (Addendum)
[name], female   DOB: 1935-07-06, 79 y.o.   MRN: [id number]   Subjective:    [name], female    DOB: 12-Mar-1935, 79 y.o.   MRN: [id number]  HPI  Patient with past history of diabetes, hypercholesterolemia, hypertension and hypothyroidism who comes in today to follow up on these issues.  Her leg aching is better off cholesterol medication.  She will occasionally notice some pain right side of right leg.  She relates it to taking her calcium.  When she stops her calcium, pain resolved.  No pain now.  No back pain.  No other leg pain.  Tries to stay active.  No cardiac symptoms with increased activity or exertion.  No sob.  No acid reflux.  No abdominal pain or cramping.  Bowels stable.  Mild joint space narrowing on xray of knee.     Past Medical History  Diagnosis Date  . Diabetes mellitus ([place])   . Hypothyroidism   . Hypercholesterolemia   . Osteoporosis   . Anemia   . Hypertension    Past Surgical History  Procedure Laterality Date  . Thyroid surgery  1992    left hemi-thyroidectomy (colloid goiter0  . Cyst excision      low back   Family History  Problem Relation Age of Onset  . Emphysema Father   . Heart disease Mother     myocardial infarction (69)  . Hypertension Mother   . Diabetes Mother   . Heart disease Brother     drug use  . Diabetes Mellitus II Sister     x3  . Hypertension Sister   . Breast cancer      niece  . Colon cancer Neg Hx    Social History   Social History  . Marital Status: Married    Spouse Name: N/A  . Number of Children: 2  . Years of Education: N/A   Social History Main Topics  . Smoking status: Never Smoker   . Smokeless tobacco: Never Used  . Alcohol Use: No     Comment: occasional  . Drug Use: No  . Sexual Activity: Not Asked   Other Topics Concern  . None   Social History Narrative    Outpatient Encounter Prescriptions as of 10/15/2014  Medication Sig  . aspirin 81 MG tablet Take 81  mg by mouth daily.  . calcium-vitamin D (CALCIUM 500+D) 500-200 MG-UNIT per tablet Take 1 tablet by mouth daily.  Marland Kitchen GLIPIZIDE XL 5 MG 24 hr tablet TAKE 1 TABLET DAILY  . glucose blood (ACCU-CHEK COMPACT PLUS) test strip CHECK ONCE DAILY  . lisinopril (PRINIVIL,ZESTRIL) 10 MG tablet TAKE 1 TABLET DAILY  . metFORMIN (GLUCOPHAGE) 500 MG tablet TAKE 2 TABLETS TWICE A DAY  . Multiple Vitamin (MULTIVITAMIN) tablet Take 1 tablet by mouth daily.  Marland Kitchen omeprazole (PRILOSEC) 20 MG capsule TAKE 1 CAPSULE DAILY   No facility-administered encounter medications on file as of 10/15/2014.    Review of Systems  Constitutional: Negative for appetite change and unexpected weight change.  HENT: Negative for congestion and sinus pressure.   Eyes: Negative for pain and visual disturbance.  Respiratory: Negative for cough, chest tightness and shortness of breath.   Cardiovascular: Negative for chest pain, palpitations and leg swelling.  Gastrointestinal: Negative for nausea, vomiting, abdominal pain and diarrhea.  Genitourinary: Negative for dysuria and difficulty urinating.  Musculoskeletal: Negative for back pain and neck pain.  Skin: Negative for color change and rash.  Neurological:  Negative for dizziness, light-headedness and headaches.  Psychiatric/Behavioral: Negative for dysphoric mood and agitation.       Objective:    Physical Exam  Constitutional: She appears well-developed and well-nourished. No distress.  HENT:  Nose: Nose normal.  Mouth/Throat: Oropharynx is clear and moist.  Eyes: Conjunctivae are normal. Right eye exhibits no discharge. Left eye exhibits no discharge.  Neck: Neck supple. No thyromegaly present.  Cardiovascular: Normal rate and regular rhythm.   Pulmonary/Chest: Breath sounds normal. No respiratory distress. She has no wheezes.  Abdominal: Soft. Bowel sounds are normal. There is no tenderness.  Musculoskeletal: She exhibits no edema or tenderness.  Lymphadenopathy:     She has no cervical adenopathy.  Skin: No rash noted. No erythema.  Psychiatric: She has a normal mood and affect. Her behavior is normal.    BP 110/70 mmHg  Pulse 83  Temp(Src) 98.3 F (36.8 C) (Oral)  Resp 17  Ht 5' 2.5" (1.588 m)  Wt 143 lb 12 oz (65.205 kg)  BMI 25.86 kg/m2  SpO2 97% Wt Readings from Last 3 Encounters:  10/15/14 143 lb 12 oz (65.205 kg)  06/15/14 141 lb 2 oz (64.014 kg)  02/13/14 141 lb 8 oz (64.184 kg)     Lab Results  Component Value Date   WBC 8.6 10/12/2014   HGB 11.2* 10/12/2014   HCT 35.8* 10/12/2014   PLT 268.0 10/12/2014   GLUCOSE 87 10/12/2014   CHOL 199 10/12/2014   TRIG 68.0 10/12/2014   HDL 55.10 10/12/2014   LDLCALC 131* 10/12/2014   ALT 14 10/12/2014   AST 22 10/12/2014   NA 138 10/12/2014   K 4.7 10/12/2014   CL 105 10/12/2014   CREATININE 0.78 10/12/2014   BUN 18 10/12/2014   CO2 24 10/12/2014   TSH 0.44 06/12/2014   HGBA1C 6.8* 10/12/2014   MICROALBUR 1.3 06/12/2014       Assessment & Plan:   Problem List Items Addressed This Visit    Anemia    EGD 04/22/12 - gastritis.  Colonoscopy 4/141/4 - redundant colon.  Non bleeding hemorrhoids.  Follow cbc.       Relevant Orders   CBC with Differential/Platelet   Ferritin   Diabetes mellitus ([place])    Low carb diet and exercise.  Follow met b and a1c.  Continue current medication regimen.  Keep up to date with eye checks.   Lab Results  Component Value Date   HGBA1C 6.8* 10/12/2014        Relevant Orders   Hemoglobin A2N   Basic metabolic panel   Hypercholesterolemia    Low cholesterol diet and exercise.  Follow lipid panel.        Relevant Orders   Lipid panel   Hepatic function panel   Hypertension    Blood pressure under good control.  Continue same medication regimen.  Follow pressures.  Follow metabolic panel.        Leg pain    Leg pain stopped with stopping the statin medication.  The right leg discomfort stopped with stopping the calcium.  No pain now.   Follow.        Osteoporosis    Off fosamax.  Due bone density.  Schedule f/u bone density.        Relevant Orders   DG Bone Density   Vit D  25 hydroxy (rtn osteoporosis monitoring)    Other Visit Diagnoses    Encounter for immunization    -  Primary  Einar Pheasant, MD   Addendum:  After reviewing, the diagnoses for the bone density should be osteopenia instead of osteoporosis.

## 2014-10-15 NOTE — Patient Instructions (Signed)

## 2014-10-15 NOTE — Progress Notes (Signed)
Pre-visit discussion using our clinic review tool. No additional management support is needed unless otherwise documented below in the visit note.  

## 2014-10-18 ENCOUNTER — Encounter: Payer: Self-pay | Admitting: Internal Medicine

## 2014-10-18 NOTE — Assessment & Plan Note (Signed)
Low cholesterol diet and exercise.  Follow lipid panel.   

## 2014-10-18 NOTE — Assessment & Plan Note (Signed)
Low carb diet and exercise.  Follow met b and a1c.  Continue current medication regimen.  Keep up to date with eye checks.   Lab Results  Component Value Date   HGBA1C 6.8* 10/12/2014

## 2014-10-18 NOTE — Assessment & Plan Note (Signed)
Leg pain stopped with stopping the statin medication.  The right leg discomfort stopped with stopping the calcium.  No pain now.  Follow.

## 2014-10-18 NOTE — Assessment & Plan Note (Signed)
EGD 04/22/12 - gastritis.  Colonoscopy 4/141/4 - redundant colon.  Non bleeding hemorrhoids.  Follow cbc.

## 2014-10-18 NOTE — Assessment & Plan Note (Signed)
Off fosamax.  Due bone density.  Schedule f/u bone density.

## 2014-10-18 NOTE — Assessment & Plan Note (Signed)
Blood pressure under good control.  Continue same medication regimen.  Follow pressures.  Follow metabolic panel.   

## 2014-11-03 ENCOUNTER — Ambulatory Visit
Admission: RE | Admit: 2014-11-03 | Discharge: 2014-11-03 | Disposition: A | Discharge status: Home / Self Care | Payer: Medicare Other | Source: Ambulatory Visit | Attending: Internal Medicine | Admitting: Internal Medicine

## 2014-11-03 DIAGNOSIS — Z780 Asymptomatic menopausal state: Secondary | ICD-10-CM | POA: Diagnosis not present

## 2014-11-03 DIAGNOSIS — M810 Age-related osteoporosis without current pathological fracture: Secondary | ICD-10-CM

## 2014-11-03 DIAGNOSIS — M8580 Other specified disorders of bone density and structure, unspecified site: Secondary | ICD-10-CM | POA: Diagnosis present

## 2014-11-03 DIAGNOSIS — M85852 Other specified disorders of bone density and structure, left thigh: Secondary | ICD-10-CM | POA: Diagnosis not present

## 2014-11-04 ENCOUNTER — Encounter: Payer: Self-pay | Admitting: Internal Medicine

## 2014-11-06 NOTE — Telephone Encounter (Signed)
Unread mychart message mailed to patient 

## 2014-11-26 ENCOUNTER — Other Ambulatory Visit: Payer: Self-pay | Admitting: Internal Medicine

## 2014-11-30 ENCOUNTER — Other Ambulatory Visit: Payer: Self-pay | Admitting: *Deleted

## 2014-11-30 MED ORDER — METFORMIN HCL 500 MG PO TABS: 1000.0000 mg | ORAL_TABLET | Freq: Two times a day (BID) | ORAL | Status: DC

## 2014-12-24 DIAGNOSIS — E119 Type 2 diabetes mellitus without complications: Secondary | ICD-10-CM | POA: Diagnosis not present

## 2014-12-24 LAB — HM DIABETES EYE EXAM

## 2014-12-29 ENCOUNTER — Encounter: Payer: Self-pay | Admitting: Internal Medicine

## 2014-12-31 ENCOUNTER — Other Ambulatory Visit: Payer: Self-pay | Admitting: Internal Medicine

## 2015-01-20 ENCOUNTER — Other Ambulatory Visit: Payer: Self-pay | Admitting: Rheumatology

## 2015-01-20 DIAGNOSIS — M545 Low back pain: Secondary | ICD-10-CM

## 2015-01-26 ENCOUNTER — Ambulatory Visit
Admission: RE | Admit: 2015-01-26 | Discharge: 2015-01-26 | Disposition: A | Discharge status: Home / Self Care | Payer: Medicare Other | Source: Ambulatory Visit | Attending: Rheumatology | Admitting: Rheumatology

## 2015-01-26 DIAGNOSIS — M47819 Spondylosis without myelopathy or radiculopathy, site unspecified: Secondary | ICD-10-CM | POA: Insufficient documentation

## 2015-01-26 DIAGNOSIS — G8929 Other chronic pain: Secondary | ICD-10-CM | POA: Diagnosis present

## 2015-01-26 DIAGNOSIS — M4807 Spinal stenosis, lumbosacral region: Secondary | ICD-10-CM | POA: Diagnosis not present

## 2015-01-26 DIAGNOSIS — M4806 Spinal stenosis, lumbar region: Secondary | ICD-10-CM | POA: Diagnosis not present

## 2015-01-26 DIAGNOSIS — M545 Low back pain: Secondary | ICD-10-CM | POA: Diagnosis not present

## 2015-01-26 DIAGNOSIS — M5136 Other intervertebral disc degeneration, lumbar region: Secondary | ICD-10-CM | POA: Insufficient documentation

## 2015-01-26 DIAGNOSIS — M5134 Other intervertebral disc degeneration, thoracic region: Secondary | ICD-10-CM | POA: Insufficient documentation

## 2015-01-26 DIAGNOSIS — M79604 Pain in right leg: Secondary | ICD-10-CM | POA: Diagnosis present

## 2015-02-16 ENCOUNTER — Other Ambulatory Visit (INDEPENDENT_AMBULATORY_CARE_PROVIDER_SITE_OTHER): Payer: Medicare Other

## 2015-02-16 DIAGNOSIS — D649 Anemia, unspecified: Secondary | ICD-10-CM | POA: Diagnosis not present

## 2015-02-16 DIAGNOSIS — E119 Type 2 diabetes mellitus without complications: Secondary | ICD-10-CM | POA: Diagnosis not present

## 2015-02-16 DIAGNOSIS — M810 Age-related osteoporosis without current pathological fracture: Secondary | ICD-10-CM | POA: Diagnosis not present

## 2015-02-16 DIAGNOSIS — E7800 Pure hypercholesterolemia, unspecified: Secondary | ICD-10-CM | POA: Diagnosis not present

## 2015-02-16 LAB — LIPID PANEL
CHOL/HDL RATIO: 4
CHOLESTEROL: 214 mg/dL — AB (ref 0–200)
HDL: 48.5 mg/dL (ref >39.00)
LDL Cholesterol: 148 mg/dL — ABNORMAL HIGH (ref 0–99)
NONHDL: 165.21
TRIGLYCERIDES: 85 mg/dL (ref 0.0–149.0)
VLDL: 17 mg/dL (ref 0.0–40.0)

## 2015-02-16 LAB — BASIC METABOLIC PANEL
BUN: 20 mg/dL (ref 6–23)
CHLORIDE: 103 meq/L (ref 96–112)
CO2: 29 mEq/L (ref 19–32)
Calcium: 9.6 mg/dL (ref 8.4–10.5)
Creatinine, Ser: 0.7 mg/dL (ref 0.40–1.20)
GFR: 104.12 mL/min (ref >60.00)
Glucose, Bld: 129 mg/dL — ABNORMAL HIGH (ref 70–99)
POTASSIUM: 4.3 meq/L (ref 3.5–5.1)
SODIUM: 137 meq/L (ref 135–145)

## 2015-02-16 LAB — CBC WITH DIFFERENTIAL/PLATELET
BASOS ABS: 0.1 10*3/uL (ref 0.0–0.1)
Basophils Relative: 1 % (ref 0.0–3.0)
Eosinophils Absolute: 0.2 10*3/uL (ref 0.0–0.7)
Eosinophils Relative: 1.9 % (ref 0.0–5.0)
HCT: 36.4 % (ref 36.0–46.0)
Hemoglobin: 11.4 g/dL — ABNORMAL LOW (ref 12.0–15.0)
LYMPHS ABS: 2.3 10*3/uL (ref 0.7–4.0)
Lymphocytes Relative: 27 % (ref 12.0–46.0)
MCHC: 31.3 g/dL (ref 30.0–36.0)
MCV: 77.7 fl — AB (ref 78.0–100.0)
Monocytes Absolute: 0.6 10*3/uL (ref 0.1–1.0)
Monocytes Relative: 6.9 % (ref 3.0–12.0)
NEUTROS ABS: 5.3 10*3/uL (ref 1.4–7.7)
Neutrophils Relative %: 63.2 % (ref 43.0–77.0)
PLATELETS: 297 10*3/uL (ref 150.0–400.0)
RBC: 4.68 Mil/uL (ref 3.87–5.11)
RDW: 17.3 % — ABNORMAL HIGH (ref 11.5–15.5)
WBC: 8.4 10*3/uL (ref 4.0–10.5)

## 2015-02-16 LAB — HEPATIC FUNCTION PANEL
ALT: 15 U/L (ref 0–35)
AST: 16 U/L (ref 0–37)
Albumin: 4.3 g/dL (ref 3.5–5.2)
Alkaline Phosphatase: 41 U/L (ref 39–117)
Bilirubin, Direct: 0 mg/dL (ref 0.0–0.3)
Total Bilirubin: 0.4 mg/dL (ref 0.2–1.2)
Total Protein: 7.7 g/dL (ref 6.0–8.3)

## 2015-02-16 LAB — HEMOGLOBIN A1C: HEMOGLOBIN A1C: 6.8 % — AB (ref 4.6–6.5)

## 2015-02-16 LAB — FERRITIN: FERRITIN: 119.2 ng/mL (ref 10.0–291.0)

## 2015-02-16 LAB — VITAMIN D 25 HYDROXY (VIT D DEFICIENCY, FRACTURES): VITD: 45.62 ng/mL (ref 30.00–100.00)

## 2015-02-17 ENCOUNTER — Encounter: Payer: Self-pay | Admitting: Internal Medicine

## 2015-02-18 ENCOUNTER — Ambulatory Visit (INDEPENDENT_AMBULATORY_CARE_PROVIDER_SITE_OTHER): Payer: Medicare Other | Admitting: Internal Medicine

## 2015-02-18 ENCOUNTER — Encounter: Payer: Self-pay | Admitting: Internal Medicine

## 2015-02-18 VITALS — BP 118/70 | HR 75 | Temp 98.0°F | Resp 18 | Ht 62.25 in | Wt 141.5 lb

## 2015-02-18 DIAGNOSIS — I10 Essential (primary) hypertension: Secondary | ICD-10-CM | POA: Diagnosis not present

## 2015-02-18 DIAGNOSIS — E7800 Pure hypercholesterolemia, unspecified: Secondary | ICD-10-CM

## 2015-02-18 DIAGNOSIS — Z1239 Encounter for other screening for malignant neoplasm of breast: Secondary | ICD-10-CM

## 2015-02-18 DIAGNOSIS — Z0000 Encounter for general adult medical examination without abnormal findings: Secondary | ICD-10-CM

## 2015-02-18 DIAGNOSIS — E119 Type 2 diabetes mellitus without complications: Secondary | ICD-10-CM | POA: Diagnosis not present

## 2015-02-18 DIAGNOSIS — D649 Anemia, unspecified: Secondary | ICD-10-CM

## 2015-02-18 DIAGNOSIS — M79606 Pain in leg, unspecified: Secondary | ICD-10-CM

## 2015-02-18 DIAGNOSIS — M8580 Other specified disorders of bone density and structure, unspecified site: Secondary | ICD-10-CM

## 2015-02-18 NOTE — Progress Notes (Signed)
Pre-visit discussion using our clinic review tool. No additional management support is needed unless otherwise documented below in the visit note.  

## 2015-02-18 NOTE — Patient Instructions (Signed)
Check on insurance coverage for Welchol - for cholesterol.

## 2015-02-18 NOTE — Progress Notes (Signed)
[name], female   DOB: [birth year], 80 y.o.   MRN: [id number]   Subjective:    [name], female    DOB: Jun 26, 1935, 80 y.o.   MRN: [id number]  HPI  Patient with past history of diabetes, hypercholesterolemia, hypothyroidism, anemia and hypertension.  She comes in today to follow up on these issues as well as for a complete physical exam.  She had been having increased leg pain recently.  Had MRI.  Found to have spinal stenosis.  Has been referred to Dr Sharlet Salina.  Has been on gabapentin.  Stopped taking.  Not having any pain now.  Feels better.  No chest pain or tightness.  Breathing stable.  No increased cough or congestion.  No abdominal pain or cramping.  Bowels stable.     Past Medical History  Diagnosis Date  . Diabetes mellitus ([place])   . Hypothyroidism   . Hypercholesterolemia   . Osteoporosis   . Anemia   . Hypertension    Past Surgical History  Procedure Laterality Date  . Thyroid surgery  1992    left hemi-thyroidectomy (colloid goiter0  . Cyst excision      low back   Family History  Problem Relation Age of Onset  . Emphysema Father   . Heart disease Mother     myocardial infarction (69)  . Hypertension Mother   . Diabetes Mother   . Heart disease Brother     drug use  . Diabetes Mellitus II Sister     x3  . Hypertension Sister   . Breast cancer      niece  . Colon cancer Neg Hx    Social History   Social History  . Marital Status: Married    Spouse Name: N/A  . Number of Children: 2  . Years of Education: N/A   Social History Main Topics  . Smoking status: Never Smoker   . Smokeless tobacco: Never Used  . Alcohol Use: No     Comment: occasional  . Drug Use: No  . Sexual Activity: Not Asked   Other Topics Concern  . None   Social History Narrative    Outpatient Encounter Prescriptions as of 02/18/2015  Medication Sig  . aspirin 81 MG tablet Take 81 mg by mouth daily.  . calcium-vitamin D (CALCIUM 500+D)  500-200 MG-UNIT per tablet Take 1 tablet by mouth daily.  Marland Kitchen gabapentin (NEURONTIN) 100 MG capsule TAKE 1 CAPSULE (100 MG TOTAL) BY MOUTH 3 (THREE) TIMES DAILY.  Marland Kitchen GLIPIZIDE XL 5 MG 24 hr tablet TAKE 1 TABLET DAILY  . glucose blood (ACCU-CHEK COMPACT PLUS) test strip CHECK ONCE DAILY  . lisinopril (PRINIVIL,ZESTRIL) 10 MG tablet TAKE 1 TABLET DAILY  . metFORMIN (GLUCOPHAGE) 500 MG tablet Take 2 tablets (1,000 mg total) by mouth 2 (two) times daily.  . Multiple Vitamin (MULTIVITAMIN) tablet Take 1 tablet by mouth daily.  Marland Kitchen omeprazole (PRILOSEC) 20 MG capsule TAKE 1 CAPSULE DAILY   No facility-administered encounter medications on file as of 02/18/2015.    Review of Systems  Constitutional: Negative for appetite change and unexpected weight change.  HENT: Negative for congestion and sinus pressure.   Eyes: Negative for pain and visual disturbance.  Respiratory: Negative for cough, chest tightness and shortness of breath.   Cardiovascular: Negative for chest pain, palpitations and leg swelling.  Gastrointestinal: Negative for nausea, vomiting, abdominal pain and diarrhea.  Genitourinary: Negative for dysuria and difficulty urinating.  Musculoskeletal: Negative for joint swelling.  Previous leg pain as outlined.    Skin: Negative for color change and rash.  Neurological: Negative for dizziness, light-headedness and headaches.  Hematological: Negative for adenopathy. Does not bruise/bleed easily.  Psychiatric/Behavioral: Negative for dysphoric mood and agitation.       Objective:    Physical Exam  Constitutional: She is oriented to person, place, and time. She appears well-developed and well-nourished. No distress.  HENT:  Nose: Nose normal.  Mouth/Throat: Oropharynx is clear and moist.  Eyes: Right eye exhibits no discharge. Left eye exhibits no discharge. No scleral icterus.  Neck: Neck supple. No thyromegaly present.  Cardiovascular: Normal rate and regular rhythm.     Pulmonary/Chest: Breath sounds normal. No accessory muscle usage. No tachypnea. No respiratory distress. She has no decreased breath sounds. She has no wheezes. She has no rhonchi. Right breast exhibits no inverted nipple, no mass, no nipple discharge and no tenderness (no axillary adenopathy). Left breast exhibits no inverted nipple, no mass, no nipple discharge and no tenderness (no axilarry adenopathy).  Abdominal: Soft. Bowel sounds are normal. There is no tenderness.  Musculoskeletal: She exhibits no edema or tenderness.  Lymphadenopathy:    She has no cervical adenopathy.  Neurological: She is alert and oriented to person, place, and time.  Skin: Skin is warm. No rash noted. No erythema.  Psychiatric: She has a normal mood and affect. Her behavior is normal.    BP 118/70 mmHg  Pulse 75  Temp(Src) 98 F (36.7 C) (Oral)  Resp 18  Ht 5' 2.25" (1.581 m)  Wt 141 lb 8 oz (64.184 kg)  BMI 25.68 kg/m2  SpO2 95% Wt Readings from Last 3 Encounters:  02/18/15 141 lb 8 oz (64.184 kg)  10/15/14 143 lb 12 oz (65.205 kg)  06/15/14 141 lb 2 oz (64.014 kg)     Lab Results  Component Value Date   WBC 8.4 02/16/2015   HGB 11.4* 02/16/2015   HCT 36.4 02/16/2015   PLT 297.0 02/16/2015   GLUCOSE 129* 02/16/2015   CHOL 214* 02/16/2015   TRIG 85.0 02/16/2015   HDL 48.50 02/16/2015   LDLCALC 148* 02/16/2015   ALT 15 02/16/2015   AST 16 02/16/2015   NA 137 02/16/2015   K 4.3 02/16/2015   CL 103 02/16/2015   CREATININE 0.70 02/16/2015   BUN 20 02/16/2015   CO2 29 02/16/2015   TSH 0.44 06/12/2014   HGBA1C 6.8* 02/16/2015   MICROALBUR 1.3 06/12/2014    Mr Lumbar Spine Wo Contrast  01/26/2015  CLINICAL DATA:  80 year old with 1 year history of recurring bilateral leg pain. No previous relevant surgery or acute injury. EXAM: MRI LUMBAR SPINE WITHOUT CONTRAST TECHNIQUE: Multiplanar, multisequence MR imaging of the lumbar spine was performed. No intravenous contrast was administered.  COMPARISON:  Limited correlation made with small bowel series done 07/26/2012 FINDINGS: Segmentation: Conventional anatomy assumed, with the last open disc space designated L5-S1. Alignment: Convex right scoliosis with 4 mm of degenerative anterolisthesis at L4-5. Bones: No worrisome osseous lesion, acute fracture or pars defect. Mild anterior wedging of the L2 vertebral body appears chronic and unchanged from prior radiographs. There is a hemangioma within L2 vertebral body. The lumbar pedicles are somewhat short on a congenital basis. Conus medullaris: Extends to the L2 level and appears normal. Paraspinal and other soft tissues: No significant paraspinal findings. Several uterine fibroids are partially imaged on the sagittal sequences. Disc levels: T11-12: Sagittal images demonstrate disc degeneration with annular bulging, bilateral facet hypertrophy and a slight anterolisthesis.  Both foramina appear mildly narrowed. There is no cord deformity. T12-L1: No significant findings. L1-2: Mild disc bulging and facet hypertrophy. No spinal stenosis or nerve root encroachment. L2-3: Loss of disc height with annular disc bulging eccentric to the left. There is moderate facet and ligamentous hypertrophy, worse on the left. These factors contribute to borderline spinal stenosis with moderate asymmetric narrowing of the left foramen and possible left L2 nerve root encroachment. L3-4: Loss of disc height with annular disc bulging, facet and ligamentous hypertrophy. There is resulting mild spinal stenosis. There is mild right and moderate left foraminal narrowing with potential encroachment on either L3 nerve root. L4-5: Loss of disc height with annular disc bulging. There is advanced bilateral facet hypertrophy, accounting for the grade 1 anterolisthesis and contributing to moderate spinal stenosis. There is moderate narrowing of the lateral recesses and foramina bilaterally with probable bilateral L4 nerve root encroachment.  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 L5-S1: Disc height is relatively preserved. There is annular disc bulging with moderate bilateral facet hypertrophy. There is resulting mild spinal stenosis with mild narrowing of the lateral recesses. There is moderate foraminal narrowing, right-greater-than-left. IMPRESSION: 1. Multilevel spondylosis with disc degeneration, facet and ligamentous hypertrophy as described above. There is a convex right scoliosis with a degenerative grade 1 anterolisthesis at L4-5. 2. Resulting mass effect is greatest at L4-5 with there is moderate multifactorial spinal stenosis with moderate narrowing of the lateral recesses and foramina bilaterally. Baastrup changes are present at this level. 3. Mild multifactorial spinal stenosis at L3-4 and L5-S1. 4. Multilevel foraminal narrowing as detailed above. This appears worst on the right at L4-5 and L5-S1, and on the left from L2-3 through L4-5. Electronically Signed   By: Richardean Sale M.D.   On: 01/26/2015 10:26       Assessment & Plan:   Problem List Items Addressed This Visit    Anemia    Had EGD and colonoscopy 04/22/12.  Follow cbc.  hgb stable.        Relevant Orders   CBC with Differential/Platelet   Ferritin   Diabetes mellitus ([place])    Low carb diet and exercise.  Follow met b and a1c.  On metformin.   Lab Results  Component Value Date   HGBA1C 6.8* 02/16/2015        Relevant Orders   Hemoglobin A1c   Health care maintenance    Physical today 02/18/15.  Colonoscopy 04/22/12.  mammogram 02/16/14 - Birads I.        Hypercholesterolemia    Low cholesterol diet and exercise.  Follow lipid panel.  LDL elevated.  Discussed wellchol.        Relevant Orders   Lipid panel   Hepatic function panel   Hypertension    Blood pressure under good control.  Continue same medication regimen.  Follow pressures.  Follow metabolic panel.         Relevant Orders   Basic metabolic panel   TSH   Leg pain    Found to have spinal stenosis.  Due to see Dr Sharlet Salina.  Pain better.        Osteopenia    Off fosamax.  Recent bone density - osteopenia.  Continue weight bearing exercise and calcium and vitamin D supplements.         Other Visit Diagnoses    Routine general medical examination at a health care facility    -  Primary  Screening breast examination        Relevant Orders    MM DIGITAL SCREENING BILATERAL        Einar Pheasant, MD

## 2015-02-21 ENCOUNTER — Encounter: Payer: Self-pay | Admitting: Internal Medicine

## 2015-02-21 NOTE — Assessment & Plan Note (Signed)
Off fosamax.  Recent bone density - osteopenia.  Continue weight bearing exercise and calcium and vitamin D supplements.

## 2015-02-21 NOTE — Assessment & Plan Note (Signed)
Found to have spinal stenosis.  Due to see Dr Sharlet Salina.  Pain better.

## 2015-02-21 NOTE — Assessment & Plan Note (Signed)
Had EGD and colonoscopy 04/22/12.  Follow cbc.  hgb stable.

## 2015-02-21 NOTE — Assessment & Plan Note (Signed)
Low carb diet and exercise.  Follow met b and a1c.  On metformin.   Lab Results  Component Value Date   HGBA1C 6.8* 02/16/2015

## 2015-02-21 NOTE — Assessment & Plan Note (Signed)
Physical today 02/18/15.  Colonoscopy 04/22/12.  mammogram 02/16/14 - Birads I.

## 2015-02-21 NOTE — Assessment & Plan Note (Signed)
Blood pressure under good control.  Continue same medication regimen.  Follow pressures.  Follow metabolic panel.   

## 2015-02-21 NOTE — Assessment & Plan Note (Addendum)
Low cholesterol diet and exercise.  Follow lipid panel.  LDL elevated.  Discussed wellchol.

## 2015-02-22 ENCOUNTER — Other Ambulatory Visit: Payer: Self-pay | Admitting: Internal Medicine

## 2015-02-26 ENCOUNTER — Other Ambulatory Visit: Payer: Self-pay | Admitting: Internal Medicine

## 2015-02-26 ENCOUNTER — Ambulatory Visit
Admission: RE | Admit: 2015-02-26 | Discharge: 2015-02-26 | Disposition: A | Discharge status: Home / Self Care | Payer: Medicare Other | Source: Ambulatory Visit | Attending: Internal Medicine | Admitting: Internal Medicine

## 2015-02-26 DIAGNOSIS — Z1239 Encounter for other screening for malignant neoplasm of breast: Secondary | ICD-10-CM

## 2015-02-26 DIAGNOSIS — Z1231 Encounter for screening mammogram for malignant neoplasm of breast: Secondary | ICD-10-CM | POA: Diagnosis not present

## 2015-03-06 ENCOUNTER — Other Ambulatory Visit: Payer: Self-pay | Admitting: Internal Medicine

## 2015-03-29 ENCOUNTER — Other Ambulatory Visit: Payer: Self-pay | Admitting: Internal Medicine

## 2015-06-17 ENCOUNTER — Other Ambulatory Visit: Payer: Medicare Other

## 2015-06-18 ENCOUNTER — Other Ambulatory Visit (INDEPENDENT_AMBULATORY_CARE_PROVIDER_SITE_OTHER): Payer: Medicare Other

## 2015-06-18 DIAGNOSIS — D649 Anemia, unspecified: Secondary | ICD-10-CM

## 2015-06-18 DIAGNOSIS — E119 Type 2 diabetes mellitus without complications: Secondary | ICD-10-CM

## 2015-06-18 DIAGNOSIS — E7800 Pure hypercholesterolemia, unspecified: Secondary | ICD-10-CM

## 2015-06-18 DIAGNOSIS — I10 Essential (primary) hypertension: Secondary | ICD-10-CM

## 2015-06-18 LAB — LIPID PANEL
CHOLESTEROL: 201 mg/dL — AB (ref 0–200)
HDL: 45.5 mg/dL (ref >39.00)
LDL Cholesterol: 139 mg/dL — ABNORMAL HIGH (ref 0–99)
NonHDL: 155.8
Total CHOL/HDL Ratio: 4
Triglycerides: 84 mg/dL (ref 0.0–149.0)
VLDL: 16.8 mg/dL (ref 0.0–40.0)

## 2015-06-18 LAB — BASIC METABOLIC PANEL
BUN: 19 mg/dL (ref 6–23)
CO2: 27 mEq/L (ref 19–32)
CREATININE: 0.79 mg/dL (ref 0.40–1.20)
Calcium: 9.5 mg/dL (ref 8.4–10.5)
Chloride: 104 mEq/L (ref 96–112)
GFR: 90.47 mL/min (ref >60.00)
GLUCOSE: 109 mg/dL — AB (ref 70–99)
Potassium: 4.6 mEq/L (ref 3.5–5.1)
Sodium: 138 mEq/L (ref 135–145)

## 2015-06-18 LAB — HEMOGLOBIN A1C: Hgb A1c MFr Bld: 7.1 % — ABNORMAL HIGH (ref 4.6–6.5)

## 2015-06-18 LAB — CBC WITH DIFFERENTIAL/PLATELET
BASOS PCT: 0.9 % (ref 0.0–3.0)
Basophils Absolute: 0.1 10*3/uL (ref 0.0–0.1)
EOS ABS: 0.2 10*3/uL (ref 0.0–0.7)
Eosinophils Relative: 2.4 % (ref 0.0–5.0)
HCT: 33.7 % — ABNORMAL LOW (ref 36.0–46.0)
HEMOGLOBIN: 10.7 g/dL — AB (ref 12.0–15.0)
LYMPHS ABS: 2.2 10*3/uL (ref 0.7–4.0)
Lymphocytes Relative: 26.8 % (ref 12.0–46.0)
MCHC: 31.8 g/dL (ref 30.0–36.0)
MCV: 76.3 fl — ABNORMAL LOW (ref 78.0–100.0)
MONO ABS: 0.7 10*3/uL (ref 0.1–1.0)
Monocytes Relative: 8.3 % (ref 3.0–12.0)
NEUTROS PCT: 61.6 % (ref 43.0–77.0)
Neutro Abs: 5 10*3/uL (ref 1.4–7.7)
PLATELETS: 306 10*3/uL (ref 150.0–400.0)
RBC: 4.42 Mil/uL (ref 3.87–5.11)
RDW: 17.7 % — AB (ref 11.5–15.5)
WBC: 8.1 10*3/uL (ref 4.0–10.5)

## 2015-06-18 LAB — TSH: TSH: 0.8 u[IU]/mL (ref 0.35–4.50)

## 2015-06-18 LAB — FERRITIN: FERRITIN: 115.1 ng/mL (ref 10.0–291.0)

## 2015-06-18 LAB — HEPATIC FUNCTION PANEL
ALBUMIN: 4.3 g/dL (ref 3.5–5.2)
ALT: 11 U/L (ref 0–35)
AST: 11 U/L (ref 0–37)
Alkaline Phosphatase: 45 U/L (ref 39–117)
Bilirubin, Direct: 0.1 mg/dL (ref 0.0–0.3)
TOTAL PROTEIN: 7.2 g/dL (ref 6.0–8.3)
Total Bilirubin: 0.4 mg/dL (ref 0.2–1.2)

## 2015-06-21 ENCOUNTER — Telehealth: Payer: Self-pay | Admitting: Internal Medicine

## 2015-06-21 ENCOUNTER — Ambulatory Visit (INDEPENDENT_AMBULATORY_CARE_PROVIDER_SITE_OTHER): Payer: Medicare Other | Admitting: Internal Medicine

## 2015-06-21 ENCOUNTER — Encounter: Payer: Self-pay | Admitting: Internal Medicine

## 2015-06-21 VITALS — BP 120/60 | HR 73 | Temp 98.0°F | Resp 18 | Ht 62.25 in | Wt 143.2 lb

## 2015-06-21 DIAGNOSIS — D649 Anemia, unspecified: Secondary | ICD-10-CM | POA: Diagnosis not present

## 2015-06-21 DIAGNOSIS — E7800 Pure hypercholesterolemia, unspecified: Secondary | ICD-10-CM | POA: Diagnosis not present

## 2015-06-21 DIAGNOSIS — E119 Type 2 diabetes mellitus without complications: Secondary | ICD-10-CM

## 2015-06-21 DIAGNOSIS — I10 Essential (primary) hypertension: Secondary | ICD-10-CM | POA: Diagnosis not present

## 2015-06-21 DIAGNOSIS — M79606 Pain in leg, unspecified: Secondary | ICD-10-CM

## 2015-06-21 MED ORDER — ROSUVASTATIN CALCIUM 5 MG PO TABS: 5.0000 mg | ORAL_TABLET | Freq: Every day | ORAL | Status: DC

## 2015-06-21 NOTE — Telephone Encounter (Signed)
Pt saw Dr. Nicki Reaper today. She thought Dr. Nicki Reaper was going to call in a new medication called rosuvastatin. She checked with her pharmacy and they do not have it.

## 2015-06-21 NOTE — Progress Notes (Signed)
[name], female   DOB: 1935/07/05, 80 y.o.   MRN: [id number]   Subjective:    [name], female    DOB: 16-Jul-1935, 80 y.o.   MRN: [id number]  HPI  Patient here for a scheduled follow up.  She has had persistent problems with low  Back pain with radiation into her right buttock, posterior thigh and lateral calf.  She saw Dr Sharlet Salina and was felt to have symptoms c/w neurogenic claudication and L5>S1 radiculitis.  See his note and MRI report for details.  She is on gabapentin 116m 2 capsules bid now.  Feels this controls her pain and discomfort.  She tries to stay active.  No cardiac symptoms with increased activity or exertion.  No sob.  No acid reflux.  No abdominal pain or cramping.  Bowels stable.  No blood.  Discussed recent lab results.  Discussed her cholesterol.  Discussed further treatment.  She brought in her insurance coverage - for medications.  Had pain with pravastatin.  Discussed welchol.  After reviewing, she would like to try low dose crestor.  Discussed her increased a1c.  States she has not been watching her diet as well.  Desires not to change medication.  Wants to work on diet and exercise.     Past Medical History  Diagnosis Date  . Diabetes mellitus ([place]   . Hypothyroidism   . Hypercholesterolemia   . Osteoporosis   . Anemia   . Hypertension    Past Surgical History  Procedure Laterality Date  . Thyroid surgery  1992    left hemi-thyroidectomy (colloid goiter0  . Cyst excision      low back   Family History  Problem Relation Age of Onset  . Emphysema Father   . Heart disease Mother     myocardial infarction (69)  . Hypertension Mother   . Diabetes Mother   . Heart disease Brother     drug use  . Diabetes Mellitus II Sister     x3  . Breast cancer Sister   . Hypertension Sister   . Breast cancer      niece  . Colon cancer Neg Hx    Social History   Social History  . Marital Status: Married    Spouse Name: N/A  .  Number of Children: 2  . Years of Education: N/A   Social History Main Topics  . Smoking status: Never Smoker   . Smokeless tobacco: Never Used  . Alcohol Use: No     Comment: occasional  . Drug Use: No  . Sexual Activity: Not Asked   Other Topics Concern  . None   Social History Narrative    Outpatient Encounter Prescriptions as of 06/21/2015  Medication Sig  . aspirin 81 MG tablet Take 81 mg by mouth daily.  . calcium-vitamin D (CALCIUM 500+D) 500-200 MG-UNIT per tablet Take 1 tablet by mouth daily.  .Marland Kitchengabapentin (NEURONTIN) 100 MG capsule TAKE 2 CAPSULE (100 MG TOTAL) BY MOUTH 2 (TWO) TIMES DAILY.  .Marland KitchenGLIPIZIDE XL 5 MG 24 hr tablet TAKE 1 TABLET DAILY  . glucose blood (ACCU-CHEK COMPACT PLUS) test strip CHECK ONCE DAILY  . lisinopril (PRINIVIL,ZESTRIL) 10 MG tablet TAKE 1 TABLET DAILY  . metFORMIN (GLUCOPHAGE) 500 MG tablet Take 2 tablets (1,000 mg total) by mouth 2 (two) times daily.  . Multiple Vitamin (MULTIVITAMIN) tablet Take 1 tablet by mouth daily.  .Marland Kitchenomeprazole (PRILOSEC) 20 MG capsule TAKE 1 CAPSULE DAILY  No facility-administered encounter medications on file as of 06/21/2015.    Review of Systems  Constitutional: Negative for appetite change and unexpected weight change.  HENT: Negative for congestion and sinus pressure.   Respiratory: Negative for cough, chest tightness and shortness of breath.   Cardiovascular: Negative for chest pain, palpitations and leg swelling.  Gastrointestinal: Negative for nausea, vomiting, abdominal pain and diarrhea.  Genitourinary: Negative for dysuria and difficulty urinating.  Musculoskeletal:       Back and leg pain better and controlled on gabapentin.    Skin: Negative for color change and rash.  Neurological: Negative for dizziness, light-headedness and headaches.  Psychiatric/Behavioral: Negative for dysphoric mood and agitation.       Objective:    Physical Exam  Constitutional: She appears well-developed and  well-nourished. No distress.  HENT:  Nose: Nose normal.  Mouth/Throat: Oropharynx is clear and moist.  Neck: Neck supple. No thyromegaly present.  Cardiovascular: Normal rate and regular rhythm.   Pulmonary/Chest: Breath sounds normal. No respiratory distress. She has no wheezes.  Abdominal: Soft. Bowel sounds are normal. There is no tenderness.  Musculoskeletal: She exhibits no edema or tenderness.  Lymphadenopathy:    She has no cervical adenopathy.  Skin: No rash noted. No erythema.  Psychiatric: She has a normal mood and affect. Her behavior is normal.    BP 120/60 mmHg  Pulse 73  Temp(Src) 98 F (36.7 C) (Oral)  Resp 18  Ht 5' 2.25" (1.581 m)  Wt 143 lb 4 oz (64.978 kg)  BMI 26.00 kg/m2  SpO2 97% Wt Readings from Last 3 Encounters:  06/21/15 143 lb 4 oz (64.978 kg)  02/18/15 141 lb 8 oz (64.184 kg)  10/15/14 143 lb 12 oz (65.205 kg)     Lab Results  Component Value Date   WBC 8.1 06/18/2015   HGB 10.7* 06/18/2015   HCT 33.7* 06/18/2015   PLT 306.0 06/18/2015   GLUCOSE 109* 06/18/2015   CHOL 201* 06/18/2015   TRIG 84.0 06/18/2015   HDL 45.50 06/18/2015   LDLCALC 139* 06/18/2015   ALT 11 06/18/2015   AST 11 06/18/2015   NA 138 06/18/2015   K 4.6 06/18/2015   CL 104 06/18/2015   CREATININE 0.79 06/18/2015   BUN 19 06/18/2015   CO2 27 06/18/2015   TSH 0.80 06/18/2015   HGBA1C 7.1* 06/18/2015   MICROALBUR 1.3 06/12/2014    Mm Digital Screening Bilateral  02/26/2015  CLINICAL DATA:  Screening. EXAM: DIGITAL SCREENING BILATERAL MAMMOGRAM WITH CAD COMPARISON:  Previous exam(s). ACR Breast Density Category b: There are scattered areas of fibroglandular density. FINDINGS: There are no findings suspicious for malignancy. Images were processed with CAD. IMPRESSION: No mammographic evidence of malignancy. A result letter of this screening mammogram will be mailed directly to the patient. RECOMMENDATION: Screening mammogram in one year. (Code:SM-B-01Y) BI-RADS CATEGORY   1: Negative. Electronically Signed   By: Franki Cabot M.D.   On: 02/26/2015 14:34       Assessment & Plan:   Problem List Items Addressed This Visit    Anemia - Primary    Had EGD and colonoscopy in 04/2012.  hgb decreased on this check.  No bleeding.  Recheck cbc with iron studies and B12.  Further w/up pending results.        Relevant Orders   CBC with Differential/Platelet   Vitamin B12   IBC panel   Diabetes mellitus ([place])    She is on metformin.  a1c just checked increased - 7.1.  Discussed with her today.  She desires not to add medication.  Wants to work on diet and exercise.  Low carb diet.  Follow met b and a1c.        Hypercholesterolemia    Discussed with her today - as outlined.  She wants to try low dose crestor.  Start with crestor 14m q Monday, Wednesday and Friday.  Follow closely.  Recheck liver panel in 6 weeks.        Hypertension    Blood pressure under good control.  Continue same medication regimen.  Follow pressures.  Follow metabolic panel.        Leg pain    Seeing Dr KJefm Bryantand Dr CSharlet Salina  Symptoms felt to be c/w neurogenic claudication.  MRI reviewed.  Spinal stenosis.  On gabapentin now.  Symptoms controlled.  Follow.            SEinar Pheasant MD

## 2015-06-21 NOTE — Progress Notes (Signed)
Pre-visit discussion using our clinic review tool. No additional management support is needed unless otherwise documented below in the visit note.  

## 2015-06-21 NOTE — Telephone Encounter (Signed)
rx has been sent in to pharmacy

## 2015-06-22 ENCOUNTER — Encounter: Payer: Self-pay | Admitting: Internal Medicine

## 2015-06-22 NOTE — Assessment & Plan Note (Signed)
Had EGD and colonoscopy in 04/2012.  hgb decreased on this check.  No bleeding.  Recheck cbc with iron studies and B12.  Further w/up pending results.

## 2015-06-22 NOTE — Assessment & Plan Note (Signed)
Seeing Dr Jefm Bryant and Dr Sharlet Salina.  Symptoms felt to be c/w neurogenic claudication.  MRI reviewed.  Spinal stenosis.  On gabapentin now.  Symptoms controlled.  Follow.

## 2015-06-22 NOTE — Assessment & Plan Note (Signed)
Blood pressure under good control.  Continue same medication regimen.  Follow pressures.  Follow metabolic panel.   

## 2015-06-22 NOTE — Assessment & Plan Note (Signed)
Discussed with her today - as outlined.  She wants to try low dose crestor.  Start with crestor 5mg  q Monday, Wednesday and Friday.  Follow closely.  Recheck liver panel in 6 weeks.

## 2015-06-22 NOTE — Assessment & Plan Note (Signed)
She is on metformin.  a1c just checked increased - 7.1.  Discussed with her today.  She desires not to add medication.  Wants to work on diet and exercise.  Low carb diet.  Follow met b and a1c.

## 2015-06-28 ENCOUNTER — Other Ambulatory Visit (INDEPENDENT_AMBULATORY_CARE_PROVIDER_SITE_OTHER): Payer: Medicare Other

## 2015-06-28 DIAGNOSIS — D649 Anemia, unspecified: Secondary | ICD-10-CM

## 2015-06-28 LAB — CBC WITH DIFFERENTIAL/PLATELET
BASOS ABS: 0.1 10*3/uL (ref 0.0–0.1)
BASOS PCT: 1.1 % (ref 0.0–3.0)
EOS ABS: 0.2 10*3/uL (ref 0.0–0.7)
EOS PCT: 2.3 % (ref 0.0–5.0)
HEMATOCRIT: 33 % — AB (ref 36.0–46.0)
HEMOGLOBIN: 10.5 g/dL — AB (ref 12.0–15.0)
LYMPHS ABS: 2 10*3/uL (ref 0.7–4.0)
Lymphocytes Relative: 22.2 % (ref 12.0–46.0)
MCHC: 31.7 g/dL (ref 30.0–36.0)
MCV: 77.1 fl — AB (ref 78.0–100.0)
MONO ABS: 0.5 10*3/uL (ref 0.1–1.0)
Monocytes Relative: 6 % (ref 3.0–12.0)
Neutro Abs: 6 10*3/uL (ref 1.4–7.7)
Neutrophils Relative %: 68.4 % (ref 43.0–77.0)
PLATELETS: 266 10*3/uL (ref 150.0–400.0)
RBC: 4.28 Mil/uL (ref 3.87–5.11)
RDW: 17.3 % — ABNORMAL HIGH (ref 11.5–15.5)
WBC: 8.8 10*3/uL (ref 4.0–10.5)

## 2015-06-28 LAB — VITAMIN B12: Vitamin B-12: 494 pg/mL (ref 211–911)

## 2015-06-28 LAB — IBC PANEL
Iron: 41 ug/dL — ABNORMAL LOW (ref 42–145)
SATURATION RATIOS: 12.4 % — AB (ref 20.0–50.0)
TRANSFERRIN: 237 mg/dL (ref 212.0–360.0)

## 2015-06-29 ENCOUNTER — Encounter: Payer: Self-pay | Admitting: *Deleted

## 2015-06-29 ENCOUNTER — Other Ambulatory Visit: Payer: Self-pay | Admitting: Internal Medicine

## 2015-06-29 DIAGNOSIS — D649 Anemia, unspecified: Secondary | ICD-10-CM

## 2015-06-29 NOTE — Progress Notes (Signed)
Order placed for f/u labs.  

## 2015-07-05 ENCOUNTER — Other Ambulatory Visit: Payer: Medicare Other

## 2015-07-05 NOTE — Telephone Encounter (Signed)
Pt called requesting to speak to you regarding a email you responded to.   Call pt @ [phone]. Thank you!

## 2015-07-10 ENCOUNTER — Encounter: Payer: Self-pay | Admitting: *Deleted

## 2015-07-10 NOTE — Telephone Encounter (Signed)
I sent pt a mychart message.

## 2015-07-19 ENCOUNTER — Telehealth: Payer: Self-pay | Admitting: *Deleted

## 2015-07-19 NOTE — Telephone Encounter (Signed)
Left patient a message.

## 2015-07-19 NOTE — Telephone Encounter (Signed)
Patient requested lab results from 06-29-15 Pt contact [phone]

## 2015-07-19 NOTE — Telephone Encounter (Signed)
Spoke with patient, reviewed labs, see result note for details. thanks

## 2015-07-27 ENCOUNTER — Other Ambulatory Visit: Payer: Medicare Other

## 2015-07-27 ENCOUNTER — Other Ambulatory Visit (INDEPENDENT_AMBULATORY_CARE_PROVIDER_SITE_OTHER): Payer: Medicare Other

## 2015-07-27 DIAGNOSIS — D649 Anemia, unspecified: Secondary | ICD-10-CM | POA: Diagnosis not present

## 2015-07-27 LAB — FERRITIN: Ferritin: 84.3 ng/mL (ref 10.0–291.0)

## 2015-07-27 LAB — IBC PANEL
Iron: 30 ug/dL — ABNORMAL LOW (ref 42–145)
Saturation Ratios: 9 % — ABNORMAL LOW (ref 20.0–50.0)
Transferrin: 237 mg/dL (ref 212.0–360.0)

## 2015-07-28 LAB — CBC WITH DIFFERENTIAL/PLATELET
BASOS PCT: 1.2 % (ref 0.0–3.0)
Basophils Absolute: 0.1 10*3/uL (ref 0.0–0.1)
EOS PCT: 1.3 % (ref 0.0–5.0)
Eosinophils Absolute: 0.1 10*3/uL (ref 0.0–0.7)
HEMATOCRIT: 34.5 % — AB (ref 36.0–46.0)
Hemoglobin: 11 g/dL — ABNORMAL LOW (ref 12.0–15.0)
LYMPHS PCT: 19.8 % (ref 12.0–46.0)
Lymphs Abs: 1.9 10*3/uL (ref 0.7–4.0)
MCHC: 32 g/dL (ref 30.0–36.0)
MCV: 77.4 fl — AB (ref 78.0–100.0)
Monocytes Absolute: 0.5 10*3/uL (ref 0.1–1.0)
Monocytes Relative: 4.8 % (ref 3.0–12.0)
NEUTROS ABS: 7 10*3/uL (ref 1.4–7.7)
Neutrophils Relative %: 72.9 % (ref 43.0–77.0)
PLATELETS: 304 10*3/uL (ref 150.0–400.0)
RBC: 4.46 Mil/uL (ref 3.87–5.11)
RDW: 17.5 % — AB (ref 11.5–15.5)
WBC: 9.6 10*3/uL (ref 4.0–10.5)

## 2015-07-29 ENCOUNTER — Other Ambulatory Visit: Payer: Self-pay | Admitting: Internal Medicine

## 2015-07-29 DIAGNOSIS — D649 Anemia, unspecified: Secondary | ICD-10-CM

## 2015-07-29 NOTE — Progress Notes (Signed)
Order placed for GI referral.   

## 2015-07-30 ENCOUNTER — Telehealth: Payer: Self-pay

## 2015-07-30 NOTE — Telephone Encounter (Signed)
Pt coming for labs 08/02/15. Please place future orders. Thank you.

## 2015-08-01 ENCOUNTER — Other Ambulatory Visit: Payer: Self-pay | Admitting: Internal Medicine

## 2015-08-01 DIAGNOSIS — E7800 Pure hypercholesterolemia, unspecified: Secondary | ICD-10-CM

## 2015-08-01 NOTE — Telephone Encounter (Signed)
Order placed for f/u labs.  

## 2015-08-02 ENCOUNTER — Other Ambulatory Visit (INDEPENDENT_AMBULATORY_CARE_PROVIDER_SITE_OTHER): Payer: Medicare Other

## 2015-08-02 DIAGNOSIS — E7800 Pure hypercholesterolemia, unspecified: Secondary | ICD-10-CM

## 2015-08-02 LAB — HEPATIC FUNCTION PANEL
ALT: 16 U/L (ref 0–35)
AST: 17 U/L (ref 0–37)
Albumin: 4.2 g/dL (ref 3.5–5.2)
Alkaline Phosphatase: 41 U/L (ref 39–117)
BILIRUBIN DIRECT: 0.1 mg/dL (ref 0.0–0.3)
BILIRUBIN TOTAL: 0.5 mg/dL (ref 0.2–1.2)
Total Protein: 7.5 g/dL (ref 6.0–8.3)

## 2015-08-03 ENCOUNTER — Encounter: Payer: Self-pay | Admitting: Internal Medicine

## 2015-10-22 ENCOUNTER — Ambulatory Visit (INDEPENDENT_AMBULATORY_CARE_PROVIDER_SITE_OTHER): Payer: Medicare Other | Admitting: Internal Medicine

## 2015-10-22 ENCOUNTER — Encounter: Payer: Self-pay | Admitting: Internal Medicine

## 2015-10-22 VITALS — BP 118/70 | HR 81 | Temp 98.4°F | Ht 62.0 in | Wt 144.4 lb

## 2015-10-22 DIAGNOSIS — E7800 Pure hypercholesterolemia, unspecified: Secondary | ICD-10-CM

## 2015-10-22 DIAGNOSIS — D649 Anemia, unspecified: Secondary | ICD-10-CM

## 2015-10-22 DIAGNOSIS — M8580 Other specified disorders of bone density and structure, unspecified site: Secondary | ICD-10-CM

## 2015-10-22 DIAGNOSIS — E119 Type 2 diabetes mellitus without complications: Secondary | ICD-10-CM

## 2015-10-22 DIAGNOSIS — Z23 Encounter for immunization: Secondary | ICD-10-CM

## 2015-10-22 DIAGNOSIS — I10 Essential (primary) hypertension: Secondary | ICD-10-CM

## 2015-10-22 NOTE — Progress Notes (Signed)
Pre visit review using our clinic review tool, if applicable. No additional management support is needed unless otherwise documented below in the visit note. 

## 2015-10-22 NOTE — Progress Notes (Signed)
[name], female   DOB: 03/04/1935, 80 y.o.   MRN: [id number]   Subjective:    [name], female    DOB: 12-Feb-1935, 80 y.o.   MRN: [id number]  HPI  Patient here for a scheduled follow up.  Just saw GI for IDA.  Recommended restarting iron supplements.  Had iron levels rechecked just recently.  Had f/u with Dr Gustavo Lah.  Felt no further w/up warranted at this time.  Had EGD and colonoscopy 2014.  She overall is doing well.  Some increased stress with her daughter's health issues.  Overall she feels she is handling things relatively well.  No chest pain.  Breathing stable.  Bowels stable.  No abdominal pain or cramping.  Still with decreased taste and smell.  Has seen ENT previously, but has been awhile.  Discussed referral back.  Will notify me when agreeable.     Past Medical History:  Diagnosis Date  . Anemia   . Diabetes mellitus ([place])   . Hypercholesterolemia   . Hypertension   . Hypothyroidism   . Osteoporosis    Past Surgical History:  Procedure Laterality Date  . CYST EXCISION     low back  . THYROID SURGERY  1992   left hemi-thyroidectomy (colloid goiter0   Family History  Problem Relation Age of Onset  . Emphysema Father   . Heart disease Mother     myocardial infarction (69)  . Hypertension Mother   . Diabetes Mother   . Heart disease Brother     drug use  . Diabetes Mellitus II Sister     x3  . Breast cancer Sister   . Hypertension Sister   . Breast cancer      niece  . Colon cancer Neg Hx    Social History   Social History  . Marital status: Married    Spouse name: N/A  . Number of children: 2  . Years of education: N/A   Social History Main Topics  . Smoking status: Never Smoker  . Smokeless tobacco: Never Used  . Alcohol use No     Comment: occasional  . Drug use: No  . Sexual activity: Not Asked   Other Topics Concern  . None   Social History Narrative  . None    Outpatient Encounter Prescriptions as  of 10/22/2015  Medication Sig  . aspirin 81 MG tablet Take 81 mg by mouth daily.  . calcium-vitamin D (CALCIUM 500+D) 500-200 MG-UNIT per tablet Take 1 tablet by mouth daily.  Marland Kitchen gabapentin (NEURONTIN) 100 MG capsule TAKE 2 CAPSULE (100 MG TOTAL) BY MOUTH 2 (TWO) TIMES DAILY.  Marland Kitchen GLIPIZIDE XL 5 MG 24 hr tablet TAKE 1 TABLET DAILY  . glucose blood (ACCU-CHEK COMPACT PLUS) test strip CHECK ONCE DAILY  . lisinopril (PRINIVIL,ZESTRIL) 10 MG tablet TAKE 1 TABLET DAILY  . metFORMIN (GLUCOPHAGE) 500 MG tablet Take 2 tablets (1,000 mg total) by mouth 2 (two) times daily.  . Multiple Vitamin (MULTIVITAMIN) tablet Take 1 tablet by mouth daily.  Marland Kitchen omeprazole (PRILOSEC) 20 MG capsule TAKE 1 CAPSULE DAILY  . [DISCONTINUED] rosuvastatin (CRESTOR) 5 MG tablet Take 1 tablet (5 mg total) by mouth daily. (Patient not taking: Reported on 10/22/2015)   No facility-administered encounter medications on file as of 10/22/2015.     Review of Systems  Constitutional: Negative for appetite change and unexpected weight change.  HENT: Negative for congestion and sinus pressure.   Respiratory: Negative for cough, chest tightness and  shortness of breath.   Cardiovascular: Negative for chest pain, palpitations and leg swelling.  Gastrointestinal: Negative for abdominal pain, diarrhea, nausea and vomiting.  Genitourinary: Negative for difficulty urinating and dysuria.  Musculoskeletal: Negative for back pain and joint swelling.  Skin: Negative for color change and rash.  Neurological: Negative for dizziness, light-headedness and headaches.       Some previous numbness in her right foot and toes.  Not a significant issue now.   Psychiatric/Behavioral: Negative for agitation and dysphoric mood.       Increased stress as outlined.        Objective:     Blood pressure rechecked by me:  128/70  Physical Exam  Constitutional: She appears well-developed and well-nourished. No distress.  HENT:  Nose: Nose normal.    Mouth/Throat: Oropharynx is clear and moist.  Neck: Neck supple. No thyromegaly present.  Cardiovascular: Normal rate and regular rhythm.   Pulmonary/Chest: Breath sounds normal. No respiratory distress. She has no wheezes.  Abdominal: Soft. Bowel sounds are normal. There is no tenderness.  Musculoskeletal: She exhibits no edema or tenderness.  Lymphadenopathy:    She has no cervical adenopathy.  Skin: No rash noted. No erythema.  Psychiatric: She has a normal mood and affect. Her behavior is normal.    BP 118/70   Pulse 81   Temp 98.4 F (36.9 C) (Oral)   Ht 5' 2" (1.575 m)   Wt 144 lb 6.4 oz (65.5 kg)   SpO2 97%   BMI 26.41 kg/m  Wt Readings from Last 3 Encounters:  10/22/15 144 lb 6.4 oz (65.5 kg)  06/21/15 143 lb 4 oz (65 kg)  02/18/15 141 lb 8 oz (64.2 kg)     Lab Results  Component Value Date   WBC 9.6 07/27/2015   HGB 11.0 (L) 07/27/2015   HCT 34.5 (L) 07/27/2015   PLT 304.0 07/27/2015   GLUCOSE 109 (H) 06/18/2015   CHOL 201 (H) 06/18/2015   TRIG 84.0 06/18/2015   HDL 45.50 06/18/2015   LDLCALC 139 (H) 06/18/2015   ALT 16 08/02/2015   AST 17 08/02/2015   NA 138 06/18/2015   K 4.6 06/18/2015   CL 104 06/18/2015   CREATININE 0.79 06/18/2015   BUN 19 06/18/2015   CO2 27 06/18/2015   TSH 0.80 06/18/2015   HGBA1C 7.1 (H) 06/18/2015   MICROALBUR 1.3 06/12/2014    Mm Digital Screening Bilateral  Result Date: 02/26/2015 CLINICAL DATA:  Screening. EXAM: DIGITAL SCREENING BILATERAL MAMMOGRAM WITH CAD COMPARISON:  Previous exam(s). ACR Breast Density Category b: There are scattered areas of fibroglandular density. FINDINGS: There are no findings suspicious for malignancy. Images were processed with CAD. IMPRESSION: No mammographic evidence of malignancy. A result letter of this screening mammogram will be mailed directly to the patient. RECOMMENDATION: Screening mammogram in one year. (Code:SM-B-01Y) BI-RADS CATEGORY  1: Negative. Electronically Signed   By: Franki Cabot M.D.   On: 02/26/2015 14:34       Assessment & Plan:   Problem List Items Addressed This Visit    Anemia    Just evaluated by GI.  Had EGD and colonoscopy in 2014.  They restarted iron.  Recheck levels improved.  Felt no further w/up warranted at this time.  Follow.        Relevant Orders   CBC with Differential/Platelet   Ferritin   Diabetes mellitus ([place])    On metformin.  States sugars averaging low 100s.  Follow met b and a1c.  Lab Results  Component Value Date   HGBA1C 7.1 (H) 06/18/2015        Relevant Orders   Hemoglobin A1c   Hypercholesterolemia    Did not tolerate crestor.  Tried low dose.  Lo wcholesterol diet and exercise.  Follow  Lipid panel.        Relevant Orders   Lipid panel   Hepatic function panel   Hypertension    Blood pressure under good control.  Continue same medication regimen.  Follow pressures.  Follow metabolic panel.        Relevant Orders   Basic metabolic panel   Osteopenia - Primary   Relevant Orders   VITAMIN D 25 Hydroxy (Vit-D Deficiency, Fractures)    Other Visit Diagnoses    Encounter for immunization       Relevant Orders   Flu vaccine HIGH DOSE PF (Completed)       Einar Pheasant, MD

## 2015-10-24 ENCOUNTER — Encounter: Payer: Self-pay | Admitting: Internal Medicine

## 2015-10-24 NOTE — Assessment & Plan Note (Signed)
Just evaluated by GI.  Had EGD and colonoscopy in 2014.  They restarted iron.  Recheck levels improved.  Felt no further w/up warranted at this time.  Follow.

## 2015-10-24 NOTE — Assessment & Plan Note (Signed)
Did not tolerate crestor.  Tried low dose.  Lo wcholesterol diet and exercise.  Follow  Lipid panel.

## 2015-10-24 NOTE — Assessment & Plan Note (Signed)
Blood pressure under good control.  Continue same medication regimen.  Follow pressures.  Follow metabolic panel.   

## 2015-10-24 NOTE — Assessment & Plan Note (Signed)
On metformin.  States sugars averaging low 100s.  Follow met b and a1c.   Lab Results  Component Value Date   HGBA1C 7.1 (H) 06/18/2015

## 2015-12-31 ENCOUNTER — Encounter: Payer: Self-pay | Admitting: Surgical

## 2015-12-31 LAB — HM DIABETES EYE EXAM

## 2016-02-17 ENCOUNTER — Other Ambulatory Visit: Payer: Self-pay | Admitting: Internal Medicine

## 2016-02-29 ENCOUNTER — Other Ambulatory Visit: Payer: Medicare Other

## 2016-03-03 ENCOUNTER — Ambulatory Visit: Payer: Medicare Other | Admitting: Internal Medicine

## 2016-03-14 ENCOUNTER — Other Ambulatory Visit (INDEPENDENT_AMBULATORY_CARE_PROVIDER_SITE_OTHER): Payer: Medicare Other

## 2016-03-14 DIAGNOSIS — E119 Type 2 diabetes mellitus without complications: Secondary | ICD-10-CM

## 2016-03-14 DIAGNOSIS — D649 Anemia, unspecified: Secondary | ICD-10-CM | POA: Diagnosis not present

## 2016-03-14 DIAGNOSIS — E7800 Pure hypercholesterolemia, unspecified: Secondary | ICD-10-CM

## 2016-03-14 DIAGNOSIS — I10 Essential (primary) hypertension: Secondary | ICD-10-CM | POA: Diagnosis not present

## 2016-03-14 DIAGNOSIS — M8580 Other specified disorders of bone density and structure, unspecified site: Secondary | ICD-10-CM

## 2016-03-14 LAB — CBC WITH DIFFERENTIAL/PLATELET
BASOS PCT: 0.9 % (ref 0.0–3.0)
Basophils Absolute: 0.1 10*3/uL (ref 0.0–0.1)
EOS PCT: 1.7 % (ref 0.0–5.0)
Eosinophils Absolute: 0.2 10*3/uL (ref 0.0–0.7)
HEMATOCRIT: 35.3 % — AB (ref 36.0–46.0)
HEMOGLOBIN: 11.2 g/dL — AB (ref 12.0–15.0)
Lymphocytes Relative: 22.5 % (ref 12.0–46.0)
Lymphs Abs: 2.1 10*3/uL (ref 0.7–4.0)
MCHC: 31.8 g/dL (ref 30.0–36.0)
MCV: 77.6 fl — AB (ref 78.0–100.0)
MONOS PCT: 7.7 % (ref 3.0–12.0)
Monocytes Absolute: 0.7 10*3/uL (ref 0.1–1.0)
Neutro Abs: 6.2 10*3/uL (ref 1.4–7.7)
Neutrophils Relative %: 67.2 % (ref 43.0–77.0)
Platelets: 298 10*3/uL (ref 150.0–400.0)
RBC: 4.54 Mil/uL (ref 3.87–5.11)
RDW: 16.7 % — AB (ref 11.5–15.5)
WBC: 9.2 10*3/uL (ref 4.0–10.5)

## 2016-03-14 LAB — BASIC METABOLIC PANEL
BUN: 17 mg/dL (ref 6–23)
CO2: 29 meq/L (ref 19–32)
Calcium: 9.6 mg/dL (ref 8.4–10.5)
Chloride: 104 mEq/L (ref 96–112)
Creatinine, Ser: 0.85 mg/dL (ref 0.40–1.20)
GFR: 82.99 mL/min (ref >60.00)
Glucose, Bld: 105 mg/dL — ABNORMAL HIGH (ref 70–99)
Potassium: 4.5 mEq/L (ref 3.5–5.1)
Sodium: 140 mEq/L (ref 135–145)

## 2016-03-14 LAB — HEPATIC FUNCTION PANEL
ALBUMIN: 4.2 g/dL (ref 3.5–5.2)
ALK PHOS: 39 U/L (ref 39–117)
ALT: 12 U/L (ref 0–35)
AST: 12 U/L (ref 0–37)
Bilirubin, Direct: 0.1 mg/dL (ref 0.0–0.3)
TOTAL PROTEIN: 7 g/dL (ref 6.0–8.3)
Total Bilirubin: 0.4 mg/dL (ref 0.2–1.2)

## 2016-03-14 LAB — LIPID PANEL
CHOL/HDL RATIO: 4
Cholesterol: 194 mg/dL (ref 0–200)
HDL: 48.2 mg/dL (ref >39.00)
LDL CALC: 130 mg/dL — AB (ref 0–99)
NonHDL: 145.76
TRIGLYCERIDES: 80 mg/dL (ref 0.0–149.0)
VLDL: 16 mg/dL (ref 0.0–40.0)

## 2016-03-14 LAB — VITAMIN D 25 HYDROXY (VIT D DEFICIENCY, FRACTURES): VITD: 34.06 ng/mL (ref 30.00–100.00)

## 2016-03-14 LAB — FERRITIN: Ferritin: 104.8 ng/mL (ref 10.0–291.0)

## 2016-03-14 LAB — HEMOGLOBIN A1C: Hgb A1c MFr Bld: 7 % — ABNORMAL HIGH (ref 4.6–6.5)

## 2016-03-15 ENCOUNTER — Encounter: Payer: Self-pay | Admitting: Internal Medicine

## 2016-03-21 ENCOUNTER — Ambulatory Visit (INDEPENDENT_AMBULATORY_CARE_PROVIDER_SITE_OTHER): Payer: Medicare Other | Admitting: Internal Medicine

## 2016-03-21 ENCOUNTER — Encounter: Payer: Self-pay | Admitting: Internal Medicine

## 2016-03-21 VITALS — BP 120/68 | HR 91 | Temp 98.6°F | Resp 16 | Ht 62.0 in | Wt 143.0 lb

## 2016-03-21 DIAGNOSIS — I10 Essential (primary) hypertension: Secondary | ICD-10-CM | POA: Diagnosis not present

## 2016-03-21 DIAGNOSIS — D649 Anemia, unspecified: Secondary | ICD-10-CM | POA: Diagnosis not present

## 2016-03-21 DIAGNOSIS — E7800 Pure hypercholesterolemia, unspecified: Secondary | ICD-10-CM

## 2016-03-21 DIAGNOSIS — Z1231 Encounter for screening mammogram for malignant neoplasm of breast: Secondary | ICD-10-CM | POA: Diagnosis not present

## 2016-03-21 DIAGNOSIS — R200 Anesthesia of skin: Secondary | ICD-10-CM | POA: Diagnosis not present

## 2016-03-21 DIAGNOSIS — E119 Type 2 diabetes mellitus without complications: Secondary | ICD-10-CM | POA: Diagnosis not present

## 2016-03-21 DIAGNOSIS — R202 Paresthesia of skin: Secondary | ICD-10-CM | POA: Diagnosis not present

## 2016-03-21 DIAGNOSIS — Z1239 Encounter for other screening for malignant neoplasm of breast: Secondary | ICD-10-CM

## 2016-03-21 MED ORDER — OMEPRAZOLE 20 MG PO CPDR: 20.0000 mg | DELAYED_RELEASE_CAPSULE | Freq: Every day | ORAL | 3 refills | Status: DC

## 2016-03-21 MED ORDER — METFORMIN HCL 500 MG PO TABS: 1000.0000 mg | ORAL_TABLET | Freq: Two times a day (BID) | ORAL | 3 refills | Status: DC

## 2016-03-21 MED ORDER — GLIPIZIDE ER 5 MG PO TB24: 5.0000 mg | ORAL_TABLET | Freq: Every day | ORAL | 3 refills | Status: DC

## 2016-03-21 MED ORDER — LISINOPRIL 10 MG PO TABS: 10.0000 mg | ORAL_TABLET | Freq: Every day | ORAL | 3 refills | Status: DC

## 2016-03-21 NOTE — Progress Notes (Signed)
Pre-visit discussion using our clinic review tool. No additional management support is needed unless otherwise documented below in the visit note.  

## 2016-03-21 NOTE — Progress Notes (Addendum)
[name], female   DOB: [birth year], 81 y.o.   MRN: [id number]   Subjective:    [name], female    DOB: 03/05/1935, 81 y.o.   MRN: [id number]  HPI  Patient here for a scheduled follow up.  Discussed recent lab results.  Discussed a1c 7.0.  Discussed diet and exercise.  Discussed cholesterol levels.  She reports she is doing relatively well.  Describes warm feeling right side of neck and left side of face.  Intermittent.  No facial drooping.  Also report intermittent right foot numbness.  Also sensation change in her buttock.  No weakness.  Pain is better.  Has lumbar stenosis.  Previously evaluated by Dr Sharlet Salina.  Injection helped.  This is different sensation.     Past Medical History:  Diagnosis Date  . Anemia   . Diabetes mellitus ([place])   . Hypercholesterolemia   . Hypertension   . Hypothyroidism   . Osteoporosis    Past Surgical History:  Procedure Laterality Date  . CYST EXCISION     low back  . THYROID SURGERY  1992   left hemi-thyroidectomy (colloid goiter0   Family History  Problem Relation Age of Onset  . Emphysema Father   . Heart disease Mother     myocardial infarction (69)  . Hypertension Mother   . Diabetes Mother   . Heart disease Brother     drug use  . Diabetes Mellitus II Sister     x3  . Breast cancer Sister   . Hypertension Sister   . Breast cancer      niece  . Colon cancer Neg Hx    Social History   Social History  . Marital status: Married    Spouse name: N/A  . Number of children: 2  . Years of education: N/A   Social History Main Topics  . Smoking status: Never Smoker  . Smokeless tobacco: Never Used  . Alcohol use No     Comment: occasional  . Drug use: No  . Sexual activity: Not Asked   Other Topics Concern  . None   Social History Narrative  . None    Outpatient Encounter Prescriptions as of 03/21/2016  Medication Sig  . aspirin 81 MG tablet Take 81 mg by mouth daily.  . calcium-vitamin  D (CALCIUM 500+D) 500-200 MG-UNIT per tablet Take 1 tablet by mouth daily.  Marland Kitchen gabapentin (NEURONTIN) 100 MG capsule TAKE 2 CAPSULE (100 MG TOTAL) BY MOUTH 2 (TWO) TIMES DAILY.  Marland Kitchen glipiZIDE (GLUCOTROL XL) 5 MG 24 hr tablet Take 1 tablet (5 mg total) by mouth daily.  Marland Kitchen glucose blood (ACCU-CHEK COMPACT PLUS) test strip CHECK ONCE DAILY  . lisinopril (PRINIVIL,ZESTRIL) 10 MG tablet Take 1 tablet (10 mg total) by mouth daily.  . metFORMIN (GLUCOPHAGE) 500 MG tablet Take 2 tablets (1,000 mg total) by mouth 2 (two) times daily.  . Multiple Vitamin (MULTIVITAMIN) tablet Take 1 tablet by mouth daily.  Marland Kitchen omeprazole (PRILOSEC) 20 MG capsule Take 1 capsule (20 mg total) by mouth daily.  . [DISCONTINUED] glipiZIDE (GLUCOTROL XL) 5 MG 24 hr tablet TAKE 1 TABLET DAILY  . [DISCONTINUED] lisinopril (PRINIVIL,ZESTRIL) 10 MG tablet TAKE 1 TABLET DAILY  . [DISCONTINUED] metFORMIN (GLUCOPHAGE) 500 MG tablet Take 2 tablets (1,000 mg total) by mouth 2 (two) times daily.  . [DISCONTINUED] omeprazole (PRILOSEC) 20 MG capsule TAKE 1 CAPSULE DAILY   No facility-administered encounter medications on file as of 03/21/2016.  Review of Systems  Constitutional: Negative for appetite change and unexpected weight change.  HENT: Negative for congestion and sinus pressure.   Respiratory: Negative for cough, chest tightness and shortness of breath.   Cardiovascular: Negative for chest pain, palpitations and leg swelling.  Gastrointestinal: Negative for abdominal pain, diarrhea, nausea and vomiting.  Genitourinary: Negative for difficulty urinating and dysuria.  Musculoskeletal: Negative for back pain and myalgias.  Skin: Negative for color change and rash.  Neurological: Negative for dizziness, light-headedness and headaches.       Numbness as outlined.    Psychiatric/Behavioral: Negative for agitation and dysphoric mood.       Objective:    Physical Exam  Constitutional: She appears well-developed and  well-nourished. No distress.  HENT:  Nose: Nose normal.  Mouth/Throat: Oropharynx is clear and moist.  Neck: Neck supple. No thyromegaly present.  Cardiovascular: Normal rate and regular rhythm.   Pulmonary/Chest: Breath sounds normal. No respiratory distress. She has no wheezes.  Abdominal: Soft. Bowel sounds are normal. There is no tenderness.  Musculoskeletal: She exhibits no edema or tenderness.  Lymphadenopathy:    She has no cervical adenopathy.  Skin: No rash noted. No erythema.  Psychiatric: She has a normal mood and affect. Her behavior is normal.    BP 120/68 (BP Location: Left Arm, Patient Position: Sitting, Cuff Size: Normal)   Pulse 91   Temp 98.6 F (37 C) (Oral)   Resp 16   Ht 5' 2"  (1.575 m)   Wt 143 lb (64.9 kg)   SpO2 96%   BMI 26.16 kg/m  Wt Readings from Last 3 Encounters:  03/21/16 143 lb (64.9 kg)  10/22/15 144 lb 6.4 oz (65.5 kg)  06/21/15 143 lb 4 oz (65 kg)     Lab Results  Component Value Date   WBC 9.2 03/14/2016   HGB 11.2 (L) 03/14/2016   HCT 35.3 (L) 03/14/2016   PLT 298.0 03/14/2016   GLUCOSE 105 (H) 03/14/2016   CHOL 194 03/14/2016   TRIG 80.0 03/14/2016   HDL 48.20 03/14/2016   LDLCALC 130 (H) 03/14/2016   ALT 12 03/14/2016   AST 12 03/14/2016   NA 140 03/14/2016   K 4.5 03/14/2016   CL 104 03/14/2016   CREATININE 0.85 03/14/2016   BUN 17 03/14/2016   CO2 29 03/14/2016   TSH 0.80 06/18/2015   HGBA1C 7.0 (H) 03/14/2016   MICROALBUR 1.3 06/12/2014    Mm Digital Screening Bilateral  Result Date: 02/26/2015 CLINICAL DATA:  Screening. EXAM: DIGITAL SCREENING BILATERAL MAMMOGRAM WITH CAD COMPARISON:  Previous exam(s). ACR Breast Density Category b: There are scattered areas of fibroglandular density. FINDINGS: There are no findings suspicious for malignancy. Images were processed with CAD. IMPRESSION: No mammographic evidence of malignancy. A result letter of this screening mammogram will be mailed directly to the patient.  RECOMMENDATION: Screening mammogram in one year. (Code:SM-B-01Y) BI-RADS CATEGORY  1: Negative. Electronically Signed   By: Franki Cabot M.D.   On: 02/26/2015 14:34       Assessment & Plan:   Problem List Items Addressed This Visit    Anemia    Had GI w/up.  Follow cbc.        Relevant Orders   Ferritin   CBC with Differential/Platelet   Diabetes mellitus ([place]) - Primary    Low carb diet and exercise.  a1c 7.0.  Slightly improved.  Follow met b and a1c.  Hold on changing medication.        Relevant Medications  glipiZIDE (GLUCOTROL XL) 5 MG 24 hr tablet   lisinopril (PRINIVIL,ZESTRIL) 10 MG tablet   metFORMIN (GLUCOPHAGE) 500 MG tablet   Other Relevant Orders   Hemoglobin N2B   Basic metabolic panel   Hypercholesterolemia    Low cholesterol diet and exercise.  Follow lipid panel.        Relevant Medications   lisinopril (PRINIVIL,ZESTRIL) 10 MG tablet   Other Relevant Orders   Hepatic function panel   Lipid panel   Hypertension    Blood pressure under good control.  Continue same medication regimen.  Follow pressures.  Follow metabolic panel.        Relevant Medications   lisinopril (PRINIVIL,ZESTRIL) 10 MG tablet   Other Relevant Orders   TSH   Numbness and tingling    Noticed in her foot, buttock and sensation change in her neck and face.  No weakness.  Has lumbar stenosis.  Pain is better.  Will have neurology evaluate for etiology of symptoms.  Pt in agreement.        Relevant Orders   Ambulatory referral to Neurology    Other Visit Diagnoses    Breast cancer screening       Relevant Orders   MM DIGITAL SCREENING BILATERAL       Einar Pheasant, MD

## 2016-04-02 ENCOUNTER — Encounter: Payer: Self-pay | Admitting: Internal Medicine

## 2016-04-02 DIAGNOSIS — R202 Paresthesia of skin: Secondary | ICD-10-CM

## 2016-04-02 DIAGNOSIS — R200 Anesthesia of skin: Secondary | ICD-10-CM | POA: Insufficient documentation

## 2016-04-02 NOTE — Assessment & Plan Note (Signed)
Low carb diet and exercise.  a1c 7.0.  Slightly improved.  Follow met b and a1c.  Hold on changing medication.

## 2016-04-02 NOTE — Assessment & Plan Note (Signed)
Had GI w/up.  Follow [facility].

## 2016-04-02 NOTE — Addendum Note (Signed)
Addended by: Alisa Graff on: 04/02/2016 10:06 PM   Modules accepted: Orders

## 2016-04-02 NOTE — Assessment & Plan Note (Signed)
Blood pressure under good control.  Continue same medication regimen.  Follow pressures.  Follow metabolic panel.   

## 2016-04-02 NOTE — Assessment & Plan Note (Signed)
Noticed in her foot, buttock and sensation change in her neck and face.  No weakness.  Has lumbar stenosis.  Pain is better.  Will have neurology evaluate for etiology of symptoms.  Pt in agreement.

## 2016-04-02 NOTE — Assessment & Plan Note (Signed)
Low cholesterol diet and exercise.  Follow lipid panel.   

## 2016-04-21 ENCOUNTER — Ambulatory Visit
Admission: RE | Admit: 2016-04-21 | Discharge: 2016-04-21 | Disposition: A | Discharge status: Home / Self Care | Payer: Medicare Other | Source: Ambulatory Visit | Attending: Internal Medicine | Admitting: Internal Medicine

## 2016-04-21 DIAGNOSIS — Z1239 Encounter for other screening for malignant neoplasm of breast: Secondary | ICD-10-CM

## 2016-04-21 DIAGNOSIS — Z1231 Encounter for screening mammogram for malignant neoplasm of breast: Secondary | ICD-10-CM | POA: Insufficient documentation

## 2016-05-08 ENCOUNTER — Telehealth: Payer: Self-pay | Admitting: Internal Medicine

## 2016-05-08 NOTE — Telephone Encounter (Signed)
Left pt message asking to call Allison back directly at [phone] to schedule AWV. Thanks! °

## 2016-05-18 ENCOUNTER — Other Ambulatory Visit: Payer: Self-pay

## 2016-05-18 ENCOUNTER — Telehealth: Payer: Self-pay | Admitting: Internal Medicine

## 2016-05-18 MED ORDER — GLUCOSE BLOOD VI STRP: ORAL_STRIP | 2 refills | Status: DC

## 2016-05-18 NOTE — Telephone Encounter (Signed)
PT called and is requesting a refill on her supplies for her one touch ultra 2 meter. Please advise, thank you!  Pharmacy - [id number] - [place], [place]  Call pt @ [phone]

## 2016-05-19 NOTE — Telephone Encounter (Signed)
Patient has requested a update on diabetic supplies  Pt contact [place]

## 2016-05-19 NOTE — Telephone Encounter (Signed)
Ok to fill 

## 2016-05-20 NOTE — Telephone Encounter (Signed)
Ok to refill.  Please notify pharmacy.

## 2016-05-22 NOTE — Telephone Encounter (Signed)
done

## 2016-05-26 ENCOUNTER — Telehealth: Payer: Self-pay | Admitting: *Deleted

## 2016-05-26 NOTE — Telephone Encounter (Signed)
Medication Refill requested for : lancets, test strips and meter, due to INS changes  [facility] [address]  Return Contact : [phone]

## 2016-05-26 NOTE — Telephone Encounter (Signed)
Called pharmacy gave information for refill called pt and let know their for pick up.

## 2016-06-07 ENCOUNTER — Ambulatory Visit: Payer: Medicare Other

## 2016-06-08 NOTE — Telephone Encounter (Signed)
Scheduled 06/09/16

## 2016-06-09 ENCOUNTER — Ambulatory Visit (INDEPENDENT_AMBULATORY_CARE_PROVIDER_SITE_OTHER): Payer: Medicare Other

## 2016-06-09 VITALS — BP 120/72 | HR 90 | Temp 98.3°F | Resp 18 | Ht 62.0 in | Wt 141.8 lb

## 2016-06-09 DIAGNOSIS — Z0000 Encounter for general adult medical examination without abnormal findings: Secondary | ICD-10-CM | POA: Diagnosis not present

## 2016-06-09 NOTE — Patient Instructions (Addendum)
  Ms. Wanda Ruiz , Thank you for taking time to come for your Medicare Wellness Visit. I appreciate your ongoing commitment to your health goals. Please review the following plan we discussed and let me know if I can assist you in the future.   Follow up with Dr. Nicki Reaper as needed.    Bring a copy of your [place] and/or Living Will to be scanned into chart.  Have a great day!  These are the goals we discussed: Goals    . Healthy Lifetstyle          Low carb foods Stay active.  Walk and use treadmill/ home exercise bike. Stay hydrated       This is a list of the screening recommended for you and due dates:  Health Maintenance  Topic Date Due  . Tetanus Vaccine  04/06/1954  . Complete foot exam   09/10/2013  . Pneumonia vaccines (2 of 2 - PPSV23) 02/14/2015  . Flu Shot  08/09/2016  . Hemoglobin A1C  09/14/2016  . Eye exam for diabetics  12/30/2016  . Mammogram  04/21/2017  . DEXA scan (bone density measurement)  Completed

## 2016-06-09 NOTE — Progress Notes (Signed)
Subjective:   Zurie Platas is a 81 y.o. female who presents for an Initial Medicare Annual Wellness Visit.  Review of Systems    No ROS.  Medicare Wellness Visit.  Cardiac Risk Factors include: advanced age (>25men, >39 women);hypertension;diabetes mellitus     Objective:    Today's Vitals   06/09/16 1314  BP: 120/72  Pulse: 90  Resp: 18  Temp: 98.3 F (36.8 C)  TempSrc: Oral  Weight: 141 lb 12.8 oz (64.3 kg)  Height: 5\' 2"  (1.575 m)   Body mass index is 25.94 kg/m.   Current Medications (verified) Outpatient Encounter Prescriptions as of 06/09/2016  Medication Sig  . aspirin 81 MG tablet Take 81 mg by mouth daily.  Marland Kitchen gabapentin (NEURONTIN) 100 MG capsule TAKE 2 CAPSULE (100 MG TOTAL) BY MOUTH 2 (TWO) TIMES DAILY.  Marland Kitchen glipiZIDE (GLUCOTROL XL) 5 MG 24 hr tablet Take 1 tablet (5 mg total) by mouth daily.  Marland Kitchen glucose blood (ACCU-CHEK COMPACT PLUS) test strip CHECK ONCE DAILY  . lisinopril (PRINIVIL,ZESTRIL) 10 MG tablet Take 1 tablet (10 mg total) by mouth daily.  . metFORMIN (GLUCOPHAGE) 500 MG tablet Take 2 tablets (1,000 mg total) by mouth 2 (two) times daily.  . Multiple Vitamin (MULTIVITAMIN) tablet Take 1 tablet by mouth daily.  Marland Kitchen omeprazole (PRILOSEC) 20 MG capsule Take 1 capsule (20 mg total) by mouth daily.  . [DISCONTINUED] calcium-vitamin D (CALCIUM 500+D) 500-200 MG-UNIT per tablet Take 1 tablet by mouth daily.   No facility-administered encounter medications on file as of 06/09/2016.     Allergies (verified) Patient has no known allergies.   History: Past Medical History:  Diagnosis Date  . Anemia   . Diabetes mellitus ([place])   . Hypercholesterolemia   . Hypertension   . Hypothyroidism   . Osteoporosis    Past Surgical History:  Procedure Laterality Date  . CYST EXCISION     low back  . THYROID SURGERY  1992   left hemi-thyroidectomy (colloid goiter0   Family History  Problem Relation Age of Onset  . Emphysema Father   . Heart disease Mother         myocardial infarction (69)  . Hypertension Mother   . Diabetes Mother   . Heart disease Brother        drug use  . Diabetes Mellitus II Sister        x3  . Hypertension Sister   . Breast cancer Unknown        niece  . Breast cancer Sister   . Colon cancer Neg Hx    Social History   Occupational History  . Not on file.   Social History Main Topics  . Smoking status: Never Smoker  . Smokeless tobacco: Never Used  . Alcohol use No     Comment: occasional  . Drug use: No  . Sexual activity: Not on file    Tobacco Counseling Counseling given: Not Answered   Activities of Daily Living In your present state of health, do you have any difficulty performing the following activities: 06/09/2016  Hearing? N  Vision? N  Difficulty concentrating or making decisions? N  Walking or climbing stairs? N  Dressing or bathing? N  Doing errands, shopping? N  Preparing Food and eating ? N  Using the Toilet? N  In the past six months, have you accidently leaked urine? N  Do you have problems with loss of bowel control? N  Managing your Medications? N  Managing your Finances?  N  Housekeeping or managing your Housekeeping? N  Some recent data might be hidden    Immunizations and Health Maintenance Immunization History  Administered Date(s) Administered  . Influenza, High Dose Seasonal PF 10/22/2015  . Influenza,inj,Quad PF,36+ Mos 10/13/2013, 10/15/2014  . Pneumococcal Conjugate-13 02/13/2014   Health Maintenance Due  Topic Date Due  . TETANUS/TDAP  04/06/1954  . FOOT EXAM  09/10/2013  . PNA vac Low Risk Adult (2 of 2 - PPSV23) 02/14/2015    Patient Care Team: Einar Pheasant, MD as PCP - General (Internal Medicine)  Indicate any recent Medical Services you may have received from other than [facility] providers in the past year (date may be approximate).     Assessment:   This is a routine wellness examination for Schwanda. The goal of the wellness visit is to assist the  patient how to close the gaps in care and create a preventative care plan for the patient.   Osteoporosis reviewed.  Calcium VIT D discontinued per patient preference and states she was never taking it.  Medications reviewed; taking without issues or barriers.  Safety issues reviewed; smoke detectors in the home. No firearms in the home.  Wears seatbelts when driving or riding with others. Patient does wear sunscreen or protective clothing when in direct sunlight. No violence in the home.  Depression- PHQ 2 &9 complete.  No signs/symptoms or verbal communication regarding little pleasure in doing things, feeling down, depressed or hopeless. No changes in sleeping, energy, eating, concentrating.  No thoughts of self harm or harm towards others.  Time spent on this topic is 8 minutes.   Patient is alert, normal appearance, oriented to person/place/and time. Correctly identified the president of the [place], recall of 3/3 words, and performing simple calculations.  Patient displays appropriate judgement and can read correct time from watch face.  No new identified risk were noted.  No failures at ADL's or IADL's.   BMI- discussed the importance of a healthy diet, water intake and exercise. Educational material provided.   Daily fluid intake: 1 cups of caffeine, 4 cups of water  HTN- followed by PCP.  Dental- every 8 months.  Dr. Myrene Buddy.  Sleep patterns- Sleeps 8 hours at night.  Wakes feeling rested.  Prevnar 13 and TDAP vaccine deferred per patient preference.  Educational material provided.  Patient Concerns: None at this time. Follow up with PCP as needed.  Hearing/Vision screen Hearing Screening Comments: Patient is able to hear conversational tones without difficulty.  No issues reported.   Vision Screening Comments: Followed by [facility] (Dr. Wallace Going) Wears corrective lenses when reading Last OV 06/2015 Visual acuity not assessed per patient preference  since they have regular follow up with the ophthalmologist  Dietary issues and exercise activities discussed: Current Exercise Habits: The patient does not participate in regular exercise at present  Goals    . Healthy Lifetstyle          Low carb foods Stay active.  Walk and use treadmill/ home exercise bike. Stay hydrated      Depression Screen PHQ 2/9 Scores 06/09/2016 10/22/2015 02/18/2015 06/15/2014 02/13/2014 01/23/2013 05/12/2012  PHQ - 2 Score 0 0 0 0 0 0 0  PHQ- 9 Score 0 - - - - - -    Fall Risk Fall Risk  06/09/2016 10/22/2015 02/18/2015 06/15/2014 02/13/2014  Falls in the past year? No No No No No    Cognitive Function: MMSE - Mini Mental State Exam 06/09/2016  Orientation to  time 5  Orientation to Place 5  Registration 3  Attention/ Calculation 5  Recall 3  Language- name 2 objects 2  Language- repeat 1  Language- follow 3 step command 3  Language- read & follow direction 1  Write a sentence 1  Copy design 1  Total score 30        Screening Tests Health Maintenance  Topic Date Due  . TETANUS/TDAP  04[birth year]06/1954  . FOOT EXAM  09/10/2013  . PNA vac Low Risk Adult (2 of 2 - PPSV23) 02/14/2015  . INFLUENZA VACCINE  08/09/2016  . HEMOGLOBIN A1C  09/14/2016  . OPHTHALMOLOGY EXAM  12/30/2016  . MAMMOGRAM  04/21/2017  . DEXA SCAN  Completed      Plan:    End of life planning; Advance aging; Advanced directives discussed. Copy of current HCPOA/Living Will requested.    I have personally reviewed and noted the following in the patient's chart:   . Medical and social history . Use of alcohol, tobacco or illicit drugs  . Current medications and supplements . Functional ability and status . Nutritional status . Physical activity . Advanced directives . List of other physicians . Hospitalizations, surgeries, and ER visits in previous 12 months . Vitals . Screenings to include cognitive, depression, and falls . Referrals and appointments  In addition, I have reviewed  and discussed with patient certain preventive protocols, quality metrics, and best practice recommendations. A written personalized care plan for preventive services as well as general preventive health recommendations were provided to patient.     Varney Biles, LPN   06/17/3727    Reviewed above information.  Agree with plan.   Dr Nicki Reaper

## 2016-06-17 ENCOUNTER — Encounter: Payer: Self-pay | Admitting: Emergency Medicine

## 2016-06-17 ENCOUNTER — Ambulatory Visit
Admission: EM | Admit: 2016-06-17 | Discharge: 2016-06-17 | Disposition: A | Discharge status: Home / Self Care | Payer: Medicare Other | Attending: Emergency Medicine | Admitting: Emergency Medicine

## 2016-06-17 ENCOUNTER — Ambulatory Visit (INDEPENDENT_AMBULATORY_CARE_PROVIDER_SITE_OTHER): Payer: Medicare Other

## 2016-06-17 ENCOUNTER — Emergency Department
Admission: EM | Admit: 2016-06-17 | Discharge: 2016-06-17 | Disposition: A | Discharge status: Home / Self Care | Payer: Medicare Other | Attending: Emergency Medicine | Admitting: Emergency Medicine

## 2016-06-17 DIAGNOSIS — E039 Hypothyroidism, unspecified: Secondary | ICD-10-CM | POA: Insufficient documentation

## 2016-06-17 DIAGNOSIS — E119 Type 2 diabetes mellitus without complications: Secondary | ICD-10-CM | POA: Diagnosis not present

## 2016-06-17 DIAGNOSIS — R5383 Other fatigue: Secondary | ICD-10-CM | POA: Diagnosis present

## 2016-06-17 DIAGNOSIS — D72829 Elevated white blood cell count, unspecified: Secondary | ICD-10-CM | POA: Diagnosis not present

## 2016-06-17 DIAGNOSIS — I10 Essential (primary) hypertension: Secondary | ICD-10-CM | POA: Diagnosis not present

## 2016-06-17 DIAGNOSIS — J189 Pneumonia, unspecified organism: Secondary | ICD-10-CM

## 2016-06-17 DIAGNOSIS — J181 Lobar pneumonia, unspecified organism: Secondary | ICD-10-CM | POA: Diagnosis not present

## 2016-06-17 DIAGNOSIS — N390 Urinary tract infection, site not specified: Secondary | ICD-10-CM

## 2016-06-17 LAB — CBC WITH DIFFERENTIAL/PLATELET
BASOS ABS: 0.1 10*3/uL (ref 0–0.1)
BASOS PCT: 0 %
BASOS PCT: 0 %
Basophils Absolute: 0.1 10*3/uL (ref 0–0.1)
EOS ABS: 0 10*3/uL (ref 0–0.7)
EOS ABS: 0 10*3/uL (ref 0–0.7)
EOS PCT: 0 %
Eosinophils Relative: 0 %
HCT: 32.9 % — ABNORMAL LOW (ref 35.0–47.0)
HEMATOCRIT: 33.9 % — AB (ref 35.0–47.0)
HEMOGLOBIN: 10.9 g/dL — AB (ref 12.0–16.0)
Hemoglobin: 10.7 g/dL — ABNORMAL LOW (ref 12.0–16.0)
LYMPHS PCT: 6 %
Lymphocytes Relative: 4 %
Lymphs Abs: 0.8 10*3/uL — ABNORMAL LOW (ref 1.0–3.6)
Lymphs Abs: 1 10*3/uL (ref 1.0–3.6)
MCH: 24.4 pg — ABNORMAL LOW (ref 26.0–34.0)
MCH: 24.4 pg — ABNORMAL LOW (ref 26.0–34.0)
MCHC: 32 g/dL (ref 32.0–36.0)
MCHC: 32.4 g/dL (ref 32.0–36.0)
MCV: 76.3 fL — ABNORMAL LOW (ref 80.0–100.0)
MCV: 75.3 fL — ABNORMAL LOW (ref 80.0–100.0)
MONO ABS: 1.5 10*3/uL — AB (ref 0.2–0.9)
MONOS PCT: 8 %
Monocytes Absolute: 1.5 10*3/uL — ABNORMAL HIGH (ref 0.2–0.9)
Monocytes Relative: 9 %
NEUTROS ABS: 16.3 10*3/uL — AB (ref 1.4–6.5)
NEUTROS PCT: 88 %
Neutro Abs: 15.1 10*3/uL — ABNORMAL HIGH (ref 1.4–6.5)
Neutrophils Relative %: 85 %
PLATELETS: 258 10*3/uL (ref 150–440)
Platelets: 279 10*3/uL (ref 150–440)
RBC: 4.45 MIL/uL (ref 3.80–5.20)
RBC: 4.37 MIL/uL (ref 3.80–5.20)
RDW: 16.5 % — ABNORMAL HIGH (ref 11.5–14.5)
RDW: 16 % — AB (ref 11.5–14.5)
WBC: 18.7 10*3/uL — AB (ref 3.6–11.0)
WBC: 17.6 10*3/uL — AB (ref 3.6–11.0)

## 2016-06-17 LAB — URINALYSIS, COMPLETE (UACMP) WITH MICROSCOPIC
BILIRUBIN URINE: NEGATIVE
Glucose, UA: NEGATIVE mg/dL
KETONES UR: 15 mg/dL — AB
NITRITE: NEGATIVE
PROTEIN: 30 mg/dL — AB
SPECIFIC GRAVITY, URINE: 1.025 (ref 1.005–1.030)
pH: 5 (ref 5.0–8.0)

## 2016-06-17 LAB — COMPREHENSIVE METABOLIC PANEL
ALT: 14 U/L (ref 14–54)
AST: 17 U/L (ref 15–41)
Albumin: 3.9 g/dL (ref 3.5–5.0)
Alkaline Phosphatase: 52 U/L (ref 38–126)
Anion gap: 11 (ref 5–15)
BUN: 17 mg/dL (ref 6–20)
CHLORIDE: 99 mmol/L — AB (ref 101–111)
CO2: 23 mmol/L (ref 22–32)
CREATININE: 0.95 mg/dL (ref 0.44–1.00)
Calcium: 9.2 mg/dL (ref 8.9–10.3)
GFR calc non Af Amer: 55 mL/min — ABNORMAL LOW (ref >60)
Glucose, Bld: 208 mg/dL — ABNORMAL HIGH (ref 65–99)
POTASSIUM: 4.3 mmol/L (ref 3.5–5.1)
SODIUM: 133 mmol/L — AB (ref 135–145)
Total Bilirubin: 0.9 mg/dL (ref 0.3–1.2)
Total Protein: 8.5 g/dL — ABNORMAL HIGH (ref 6.5–8.1)

## 2016-06-17 LAB — LACTIC ACID, PLASMA: LACTIC ACID, VENOUS: 0.8 mmol/L (ref 0.5–1.9)

## 2016-06-17 MED ORDER — ACETAMINOPHEN 325 MG PO TABS
650.0000 mg | ORAL_TABLET | Freq: Once | ORAL | Status: AC
  Administered 2016-06-17: 650 mg via ORAL

## 2016-06-17 MED ORDER — SODIUM CHLORIDE 0.9 % IV SOLN
Freq: Once | INTRAVENOUS | Status: AC
  Administered 2016-06-17: 18:00:00 via INTRAVENOUS

## 2016-06-17 MED ORDER — VANCOMYCIN HCL IN DEXTROSE 1-5 GM/200ML-% IV SOLN
1000.0000 mg | Freq: Once | INTRAVENOUS | Status: AC
  Administered 2016-06-17: 1000 mg via INTRAVENOUS
  Filled 2016-06-17: qty 200

## 2016-06-17 MED ORDER — LEVOFLOXACIN 750 MG PO TABS: 750.0000 mg | ORAL_TABLET | Freq: Every day | ORAL | 0 refills | Status: DC

## 2016-06-17 MED ORDER — PIPERACILLIN-TAZOBACTAM 3.375 G IVPB 30 MIN
3.3750 g | Freq: Once | INTRAVENOUS | Status: AC
  Administered 2016-06-17: 3.375 g via INTRAVENOUS
  Filled 2016-06-17: qty 50

## 2016-06-17 NOTE — ED Triage Notes (Signed)
Sent from urgent care for WBC 18.9. Pt has labs including urine from today in epic. Denies pain. Only c/o fatigue/weakness.

## 2016-06-17 NOTE — ED Provider Notes (Signed)
CSN: [id number]     Arrival date & time 06/17/16  1525 History   First MD Initiated Contact with Patient 06/17/16 1626     Chief Complaint  Patient presents with  . Fatigue   (Consider location/radiation/quality/duration/timing/severity/associated sxs/prior Treatment) HPI  This a 81 year old female who presents with a chief complaint of chills and fatigue body aches that she's had Monday 5 days prior to this visit. Main complaint is that of ear ringing in the right ear. She's had no discharge. And that he experiences a slight pain with turning of her head. She does not complain of dysuria does not complain of cough and has no nausea vomiting admission temperature was 105.5 pulse rate in the 116 blood pressure 120/87 respirations 18 and O2 sats are 97% on room air. Patient appears ill but does not appear toxic.         Past Medical History:  Diagnosis Date  . Anemia   . Diabetes mellitus ([place])   . Hypercholesterolemia   . Hypertension   . Hypothyroidism   . Osteoporosis    Past Surgical History:  Procedure Laterality Date  . CYST EXCISION     low back  . THYROID SURGERY  1992   left hemi-thyroidectomy (colloid goiter0   Family History  Problem Relation Age of Onset  . Emphysema Father   . Heart disease Mother        myocardial infarction (69)  . Hypertension Mother   . Diabetes Mother   . Heart disease Brother        drug use  . Diabetes Mellitus II Sister        x3  . Hypertension Sister   . Breast cancer Unknown        niece  . Breast cancer Sister   . Colon cancer Neg Hx    Social History  Substance Use Topics  . Smoking status: Never Smoker  . Smokeless tobacco: Never Used  . Alcohol use No     Comment: occasional   OB History    No data available     Review of Systems  Constitutional: Positive for activity change, appetite change, chills, fatigue and fever.  Respiratory: Negative for cough and shortness of breath.   Cardiovascular: Negative for  chest pain.  Gastrointestinal: Negative for diarrhea, nausea and vomiting.  Genitourinary: Negative for difficulty urinating, dysuria, frequency and urgency.  All other systems reviewed and are negative.   Allergies  Patient has no known allergies.  Home Medications   Prior to Admission medications   Medication Sig Start Date End Date Taking? Authorizing Provider  albuterol (PROVENTIL HFA;VENTOLIN HFA) 108 (90 Base) MCG/ACT inhaler Inhale into the lungs every 6 (six) hours as needed for wheezing or shortness of breath.   Yes [provider]  aspirin 81 MG tablet Take 81 mg by mouth daily.   Yes [provider]  calcium-vitamin D (OSCAL WITH D) 250-125 MG-UNIT tablet Take 1 tablet by mouth daily.   Yes [provider]  gabapentin (NEURONTIN) 100 MG capsule TAKE 2 CAPSULE (100 MG TOTAL) BY MOUTH 2 (TWO) TIMES DAILY. 02/09/15  Yes [provider]  glipiZIDE (GLUCOTROL XL) 5 MG 24 hr tablet Take 1 tablet (5 mg total) by mouth daily. 03/21/16  Yes Einar Pheasant, MD  glucose blood (ACCU-CHEK COMPACT PLUS) test strip CHECK ONCE DAILY 05/18/16  Yes Einar Pheasant, MD  lisinopril (PRINIVIL,ZESTRIL) 10 MG tablet Take 1 tablet (10 mg total) by mouth daily. 03/21/16  Yes Scott,  Charlene, MD  metFORMIN (GLUCOPHAGE) 500 MG tablet Take 2 tablets (1,000 mg total) by mouth 2 (two) times daily. 03/21/16  Yes Einar Pheasant, MD  Multiple Vitamin (MULTIVITAMIN) tablet Take 1 tablet by mouth daily.   Yes [provider]  omeprazole (PRILOSEC) 20 MG capsule Take 1 capsule (20 mg total) by mouth daily. 03/21/16  Yes Einar Pheasant, MD   Meds Ordered and Administered this Visit   Medications  acetaminophen (TYLENOL) tablet 650 mg (650 mg Oral Given 06/17/16 1548)    BP 128/87 (BP Location: Left Arm)   Pulse (!) 116   Temp (!) 101.5 F (38.6 C) (Oral)   Resp 18   Ht 5\' 2"  (1.575 m)   Wt 141 lb (64 kg)   SpO2 97%   BMI 25.79 kg/m  No data found.   Physical  Exam  Constitutional: She is oriented to person, place, and time. She appears well-developed and well-nourished. No distress.  HENT:  Head: Normocephalic and atraumatic.  Nose: Nose normal.  Mouth/Throat: Oropharynx is clear and moist. No oropharyngeal exudate.  Left TM appears dull. Right TM is normal.  Eyes: Pupils are equal, round, and reactive to light. Right eye exhibits no discharge. Left eye exhibits no discharge.  Neck: Normal range of motion. Neck supple.  Pulmonary/Chest: Effort normal.  Patient has non-tussive bibasilar crackles more prevalent on the right.  Abdominal: Soft. Bowel sounds are normal. She exhibits no distension. There is no tenderness. There is no rebound and no guarding.  There is no CVA tenderness present  Musculoskeletal: Normal range of motion.  Lymphadenopathy:    She has no cervical adenopathy.  Neurological: She is alert and oriented to person, place, and time.  Skin: Skin is warm and dry. She is not diaphoretic.  Psychiatric: She has a normal mood and affect. Her behavior is normal. Judgment and thought content normal.  Nursing note and vitals reviewed.   Urgent Care Course     Procedures (including critical care time)  Labs Review Labs Reviewed  CBC WITH DIFFERENTIAL/PLATELET - Abnormal; Notable for the following:       Result Value   WBC 18.7 (*)    Hemoglobin 10.9 (*)    HCT 33.9 (*)    MCV 76.3 (*)    MCH 24.4 (*)    RDW 16.5 (*)    Neutro Abs 16.3 (*)    Lymphs Abs 0.8 (*)    Monocytes Absolute 1.5 (*)    All other components within normal limits  COMPREHENSIVE METABOLIC PANEL - Abnormal; Notable for the following:    Sodium 133 (*)    Chloride 99 (*)    Glucose, Bld 208 (*)    Total Protein 8.5 (*)    GFR calc non Af Amer 55 (*)    All other components within normal limits  URINALYSIS, COMPLETE (UACMP) WITH MICROSCOPIC - Abnormal; Notable for the following:    APPearance CLOUDY (*)    Hgb urine dipstick SMALL (*)    Ketones,  ur 15 (*)    Protein, ur 30 (*)    Leukocytes, UA TRACE (*)    Squamous Epithelial / LPF 0-5 (*)    Bacteria, UA MANY (*)    All other components within normal limits  URINE CULTURE  B. BURGDORFI ANTIBODIES  ROCKY MTN SPOTTED FVR ABS PNL(IGG+IGM)    Imaging Review Dg Chest 2 View  Result Date: 06/17/2016 CLINICAL DATA:  Cough. EXAM: CHEST  2 VIEW COMPARISON:  None. FINDINGS: Patchy opacity  at the left lateral lung base on the frontal view, in the anterior aspect of the lower lobe on the lateral view. The remainder of the lungs are clear. Normal sized heart. Tortuous aorta. Minimal thoracic spine degenerative changes. IMPRESSION: Mild left lower lobe pneumonia. Electronically Signed   By: Claudie Revering M.D.   On: 06/17/2016 17:06     Visual Acuity Review  Right Eye Distance:   Left Eye Distance:   Bilateral Distance:    Right Eye Near:   Left Eye Near:    Bilateral Near:         MDM   1. Community acquired pneumonia of left lower lobe of lung ([place])   2. Complicated UTI (urinary tract infection)    Spoke with the family and the patient. I told them of her  left lower lung pneumonia and possible complicated urinary tract infection. She has a high fever as well as a high white count. Concerned that she is septic and could worsen quickly. I recommended that she be seen in emergency department for further evaluation and definitive care. They are in agreement to this. She is stable enough to be transported by the family in private vehicle. I contacted the [facility] and spoke with Maudie Mercury, charge nurse. She is aware of the workup so far and the patient will be arriving by private vehicle.    Lorin Picket, PA-C 06/17/16 [phone]

## 2016-06-17 NOTE — ED Provider Notes (Signed)
[facility] Emergency Department Provider Note       Time seen: ----------------------------------------- 6:16 PM on 06/17/2016 -----------------------------------------     I have reviewed the triage vital signs and the nursing notes.   HISTORY   Chief Complaint Fatigue and Abnormal Lab    HPI Wanda Ruiz is a 81 y.o. female who presents to the ED for leukocytosis. Patient was sent from urgent care due to a high white blood cell count. She was sent here for same. Her complaints were fever, chills and weakness over the past several days. At the urgent care she was found to possibly have pneumonia and/or UTI. She denies any chest pain, shortness of breath, cough, vomiting or diarrhea.   Past Medical History:  Diagnosis Date  . Anemia   . Diabetes mellitus ([place])   . Hypercholesterolemia   . Hypertension   . Hypothyroidism   . Osteoporosis     Patient Active Problem List   Diagnosis Date Noted  . Numbness and tingling 04/02/2016  . Health care maintenance 02/15/2014  . Leg pain 10/14/2013  . Diabetes mellitus ([place]) 01/01/2012  . Hypertension 01/01/2012  . Hypercholesterolemia 01/01/2012  . Hypothyroidism 01/01/2012  . Osteopenia 01/01/2012  . Anemia 01/01/2012    Past Surgical History:  Procedure Laterality Date  . CYST EXCISION     low back  . THYROID SURGERY  1992   left hemi-thyroidectomy (colloid goiter0    Allergies Patient has no known allergies.  Social History Social History  Substance Use Topics  . Smoking status: Never Smoker  . Smokeless tobacco: Never Used  . Alcohol use No     Comment: occasional    Review of Systems Constitutional: Negative for fever, chills Cardiovascular: Negative for chest pain. Respiratory: Negative for shortness of breath. Gastrointestinal: Negative for abdominal pain, vomiting and diarrhea. Genitourinary: Negative for dysuria. Musculoskeletal: Negative for back pain. Skin: Negative  for rash. Neurological: Negative for headaches, focal weakness or numbness.  All systems negative/normal/unremarkable except as stated in the HPI  ____________________________________________   PHYSICAL EXAM:  VITAL SIGNS: ED Triage Vitals [06/17/16 1756]  Enc Vitals Group     BP (!) 146/93     Pulse Rate (!) 109     Resp 18     Temp 99.6 F (37.6 C)     Temp Source Oral     SpO2 97 %     Weight 140 lb (63.5 kg)     Height 5\' 3"  (1.6 m)     Head Circumference      Peak Flow      Pain Score      Pain Loc      Pain Edu?      Excl. in [place]?     Constitutional: Alert and oriented. Well appearing and in no distress. Eyes: Conjunctivae are normal. Normal extraocular movements. ENT   Head: Normocephalic and atraumatic.   Nose: No congestion/rhinnorhea.   Mouth/Throat: Mucous membranes are moist.   Neck: No stridor. Cardiovascular: Normal rate, regular rhythm. No murmurs, rubs, or gallops. Respiratory: Normal respiratory effort without tachypnea nor retractions. Breath sounds are clear and equal bilaterally. No wheezes/rales/rhonchi. Gastrointestinal: Soft and nontender. Normal bowel sounds Musculoskeletal: Nontender with normal range of motion in extremities. No lower extremity tenderness nor edema. Neurologic:  Normal speech and language. No gross focal neurologic deficits are appreciated.  Skin:  Skin is warm, dry and intact. No rash noted. Psychiatric: Mood and affect are normal. Speech and behavior are normal.  ____________________________________________  ED COURSE:  Pertinent labs & imaging results that were available during my care of the patient were reviewed by me and considered in my medical decision making (see chart for details). Patient presents for Fatigue and weakness, we will assess with labs and imaging as indicated.  EKG: Interpreted by me, sinus tachycardia with rate 105 bpm, PACs, borderline right axis. No acute abnormalities    Procedures ____________________________________________   LABS (pertinent positives/negatives)  Labs Reviewed  CBC WITH DIFFERENTIAL/PLATELET - Abnormal; Notable for the following:       Result Value   WBC 17.6 (*)    Hemoglobin 10.7 (*)    HCT 32.9 (*)    MCV 75.3 (*)    MCH 24.4 (*)    RDW 16.0 (*)    Neutro Abs 15.1 (*)    Monocytes Absolute 1.5 (*)    All other components within normal limits  CULTURE, BLOOD (ROUTINE X 2)  CULTURE, BLOOD (ROUTINE X 2)  LACTIC ACID, PLASMA  LACTIC ACID, PLASMA   ___________________________________________  FINAL ASSESSMENT AND PLAN  Pneumonia, UTI  Plan: Patient's labs and imaging were dictated above. Patient had presented for Persistent fever over the last 4 days. She was given vancomycin and Zosyn to cover for sepsis. She is currently feeling better, lactic acid is low and we will continue her on outpatient course with Levaquin. Vital signs here are unremarkable. She is stable for outpatient follow-up.   Earleen Newport, MD   Note: This note was generated in part or whole with voice recognition software. Voice recognition is usually quite accurate but there are transcription errors that can and very often do occur. I apologize for any typographical errors that were not detected and corrected.     Earleen Newport, MD 06/17/16 Joen Laura

## 2016-06-17 NOTE — ED Triage Notes (Signed)
Patient states that she has been having ear ringing, chills, tired and body aches since Monday. Patient states that she just doesn't feel good.

## 2016-06-19 ENCOUNTER — Telehealth: Payer: Self-pay | Admitting: *Deleted

## 2016-06-19 LAB — B. BURGDORFI ANTIBODIES: B burgdorferi Ab IgG+IgM: <0.91 {ISR} (ref 0.00–0.90)

## 2016-06-19 NOTE — Telephone Encounter (Signed)
Is this something that needs to be done as hospital follow up for [facility]?

## 2016-06-19 NOTE — Telephone Encounter (Signed)
Any ideas? 

## 2016-06-19 NOTE — Telephone Encounter (Signed)
Patient was seen at [facility] on 06/17/16. She will need a follow up with Dr. Nicki Reaper Please give a time and date to place pt.  Pt contact [phone]

## 2016-06-19 NOTE — Telephone Encounter (Signed)
No this is an ED follow up nothing to do with TCM.

## 2016-06-20 LAB — URINE CULTURE: Culture: >=100000 — AB

## 2016-06-20 LAB — ROCKY MTN SPOTTED FVR ABS PNL(IGG+IGM)
RMSF IgG: NEGATIVE
RMSF IgM: 0.28 index (ref 0.00–0.89)

## 2016-06-20 NOTE — Telephone Encounter (Signed)
Is this Thursday to soon there is a 9:30 open

## 2016-06-20 NOTE — Telephone Encounter (Signed)
Pt sched for tomorrow @10 :30

## 2016-06-20 NOTE — Telephone Encounter (Signed)
No can you call and see if she can come in?

## 2016-06-21 ENCOUNTER — Encounter: Payer: Self-pay | Admitting: Internal Medicine

## 2016-06-21 ENCOUNTER — Ambulatory Visit (INDEPENDENT_AMBULATORY_CARE_PROVIDER_SITE_OTHER): Payer: Medicare Other | Admitting: Internal Medicine

## 2016-06-21 VITALS — BP 108/64 | HR 86 | Temp 98.2°F | Resp 16 | Ht 62.0 in | Wt 139.0 lb

## 2016-06-21 DIAGNOSIS — E871 Hypo-osmolality and hyponatremia: Secondary | ICD-10-CM | POA: Diagnosis not present

## 2016-06-21 DIAGNOSIS — I10 Essential (primary) hypertension: Secondary | ICD-10-CM

## 2016-06-21 DIAGNOSIS — E119 Type 2 diabetes mellitus without complications: Secondary | ICD-10-CM | POA: Diagnosis not present

## 2016-06-21 DIAGNOSIS — J189 Pneumonia, unspecified organism: Secondary | ICD-10-CM

## 2016-06-21 DIAGNOSIS — J181 Lobar pneumonia, unspecified organism: Secondary | ICD-10-CM

## 2016-06-21 DIAGNOSIS — D649 Anemia, unspecified: Secondary | ICD-10-CM

## 2016-06-21 DIAGNOSIS — D72829 Elevated white blood cell count, unspecified: Secondary | ICD-10-CM

## 2016-06-21 LAB — CBC WITH DIFFERENTIAL/PLATELET
Basophils Absolute: 0.1 K/uL (ref 0.0–0.1)
Basophils Relative: 0.9 % (ref 0.0–3.0)
Eosinophils Absolute: 0.2 K/uL (ref 0.0–0.7)
Eosinophils Relative: 2.2 % (ref 0.0–5.0)
HCT: 31.7 % — ABNORMAL LOW (ref 36.0–46.0)
Hemoglobin: 10.3 g/dL — ABNORMAL LOW (ref 12.0–15.0)
Lymphocytes Relative: 16.8 % (ref 12.0–46.0)
Lymphs Abs: 1.6 K/uL (ref 0.7–4.0)
MCHC: 32.5 g/dL (ref 30.0–36.0)
MCV: 76.6 fl — ABNORMAL LOW (ref 78.0–100.0)
Monocytes Absolute: 1.1 K/uL — ABNORMAL HIGH (ref 0.1–1.0)
Monocytes Relative: 11.6 % (ref 3.0–12.0)
Neutro Abs: 6.6 K/uL (ref 1.4–7.7)
Neutrophils Relative %: 68.5 % (ref 43.0–77.0)
Platelets: 299 K/uL (ref 150.0–400.0)
RBC: 4.14 Mil/uL (ref 3.87–5.11)
RDW: 17.1 % — ABNORMAL HIGH (ref 11.5–15.5)
WBC: 9.6 K/uL (ref 4.0–10.5)

## 2016-06-21 LAB — BASIC METABOLIC PANEL
BUN: 18 mg/dL (ref 6–23)
CO2: 23 mEq/L (ref 19–32)
CREATININE: 0.95 mg/dL (ref 0.40–1.20)
Calcium: 9.8 mg/dL (ref 8.4–10.5)
Chloride: 101 mEq/L (ref 96–112)
GFR: 72.94 mL/min (ref >60.00)
GLUCOSE: 106 mg/dL — AB (ref 70–99)
Potassium: 4.3 mEq/L (ref 3.5–5.1)
Sodium: 133 mEq/L — ABNORMAL LOW (ref 135–145)

## 2016-06-21 MED ORDER — LEVOFLOXACIN 500 MG PO TABS: 500.0000 mg | ORAL_TABLET | Freq: Every day | ORAL | 0 refills | Status: DC

## 2016-06-21 NOTE — Progress Notes (Signed)
[name], female   DOB: 1935/06/20, 82 y.o.   MRN: [id number]   Subjective:    [name], female    DOB: Sep 21, 1935, 81 y.o.   MRN: [id number]  HPI  Patient here for ER follow up.  Was seen in ER on 06/17/16 with chills, body aches and fatigue.  Leukocytosis.  Diagnosed with pneumonia.  Was given vancomycin and zosyn.  Discharged on levaquin.  She reports she is feeling better.  No cough or congestion.  No chest pain.  No sob.  No acid reflux.  No abdominal pain.  Bowels moving.  No urine change.  Was also diagnosed with uti.  She is eating and drinking.  No vomiting or nausea.  Just some fatigue.     Past Medical History:  Diagnosis Date  . Anemia   . Diabetes mellitus ([place])   . Hypercholesterolemia   . Hypertension   . Hypothyroidism   . Osteoporosis    Past Surgical History:  Procedure Laterality Date  . CYST EXCISION     low back  . THYROID SURGERY  1992   left hemi-thyroidectomy (colloid goiter0   Family History  Problem Relation Age of Onset  . Emphysema Father   . Heart disease Mother        myocardial infarction (69)  . Hypertension Mother   . Diabetes Mother   . Heart disease Brother        drug use  . Diabetes Mellitus II Sister        x3  . Hypertension Sister   . Breast cancer Unknown        niece  . Breast cancer Sister   . Colon cancer Neg Hx    Social History   Social History  . Marital status: Married    Spouse name: N/A  . Number of children: 2  . Years of education: N/A   Social History Main Topics  . Smoking status: Never Smoker  . Smokeless tobacco: Never Used  . Alcohol use No     Comment: occasional  . Drug use: No  . Sexual activity: Not Asked   Other Topics Concern  . None   Social History Narrative  . None    Outpatient Encounter Prescriptions as of 06/21/2016  Medication Sig  . albuterol (PROVENTIL HFA;VENTOLIN HFA) 108 (90 Base) MCG/ACT inhaler Inhale into the lungs every 6 (six) hours as  needed for wheezing or shortness of breath.  Marland Kitchen aspirin 81 MG tablet Take 81 mg by mouth daily.  . calcium-vitamin D (OSCAL WITH D) 250-125 MG-UNIT tablet Take 1 tablet by mouth daily.  Marland Kitchen gabapentin (NEURONTIN) 100 MG capsule TAKE 2 CAPSULE (100 MG TOTAL) BY MOUTH three TIMES DAILY.  Marland Kitchen glipiZIDE (GLUCOTROL XL) 5 MG 24 hr tablet Take 1 tablet (5 mg total) by mouth daily.  Marland Kitchen lisinopril (PRINIVIL,ZESTRIL) 10 MG tablet Take 1 tablet (10 mg total) by mouth daily.  . metFORMIN (GLUCOPHAGE) 500 MG tablet Take 2 tablets (1,000 mg total) by mouth 2 (two) times daily.  . Multiple Vitamin (MULTIVITAMIN) tablet Take 1 tablet by mouth daily.  Marland Kitchen omeprazole (PRILOSEC) 20 MG capsule Take 1 capsule (20 mg total) by mouth daily.  . [DISCONTINUED] glucose blood (ACCU-CHEK COMPACT PLUS) test strip CHECK ONCE DAILY  . [DISCONTINUED] levofloxacin (LEVAQUIN) 750 MG tablet Take 1 tablet (750 mg total) by mouth daily.  Marland Kitchen levofloxacin (LEVAQUIN) 500 MG tablet Take 1 tablet (500 mg total) by mouth daily.   No  facility-administered encounter medications on file as of 06/21/2016.     Review of Systems  Constitutional: Positive for fatigue. Negative for unexpected weight change.  HENT: Negative for congestion and sinus pressure.   Respiratory: Negative for cough, chest tightness and shortness of breath.   Cardiovascular: Negative for chest pain, palpitations and leg swelling.  Gastrointestinal: Negative for abdominal pain, diarrhea, nausea and vomiting.  Genitourinary: Negative for difficulty urinating and dysuria.  Musculoskeletal: Negative for back pain and joint swelling.  Skin: Negative for color change and rash.  Neurological: Negative for dizziness, light-headedness and headaches.  Psychiatric/Behavioral: Negative for agitation and dysphoric mood.       Objective:    Physical Exam  Constitutional: She appears well-developed and well-nourished. No distress.  HENT:  Nose: Nose normal.  Mouth/Throat:  Oropharynx is clear and moist.  Neck: Neck supple. No thyromegaly present.  Cardiovascular: Normal rate and regular rhythm.   Pulmonary/Chest: Breath sounds normal. No respiratory distress. She has no wheezes.  Abdominal: Soft. Bowel sounds are normal. There is no tenderness.  Musculoskeletal: She exhibits no edema or tenderness.  Lymphadenopathy:    She has no cervical adenopathy.  Skin: No rash noted. No erythema.  Psychiatric: She has a normal mood and affect. Her behavior is normal.    BP 108/64 (BP Location: Left Arm, Patient Position: Sitting, Cuff Size: Normal)   Pulse 86   Temp 98.2 F (36.8 C) (Oral)   Resp 16   Ht 5' 2"  (1.575 m)   Wt 139 lb (63 kg)   SpO2 97%   BMI 25.42 kg/m  Wt Readings from Last 3 Encounters:  06/21/16 139 lb (63 kg)  06/17/16 140 lb (63.5 kg)  06/17/16 141 lb (64 kg)     Lab Results  Component Value Date   WBC 9.6 06/21/2016   HGB 10.3 (L) 06/21/2016   HCT 31.7 (L) 06/21/2016   PLT 299.0 06/21/2016   GLUCOSE 106 (H) 06/21/2016   CHOL 194 03/14/2016   TRIG 80.0 03/14/2016   HDL 48.20 03/14/2016   LDLCALC 130 (H) 03/14/2016   ALT 14 06/17/2016   AST 17 06/17/2016   NA 133 (L) 06/21/2016   K 4.3 06/21/2016   CL 101 06/21/2016   CREATININE 0.95 06/21/2016   BUN 18 06/21/2016   CO2 23 06/21/2016   TSH 0.80 06/18/2015   HGBA1C 7.0 (H) 03/14/2016   MICROALBUR 1.3 06/12/2014    Dg Chest 2 View  Result Date: 06/17/2016 CLINICAL DATA:  Cough. EXAM: CHEST  2 VIEW COMPARISON:  None. FINDINGS: Patchy opacity at the left lateral lung base on the frontal view, in the anterior aspect of the lower lobe on the lateral view. The remainder of the lungs are clear. Normal sized heart. Tortuous aorta. Minimal thoracic spine degenerative changes. IMPRESSION: Mild left lower lobe pneumonia. Electronically Signed   By: Claudie Revering M.D.   On: 06/17/2016 17:06       Assessment & Plan:   Problem List Items Addressed This Visit    Anemia    Has had GI  w/up as outlined.  Recheck cbc.       Diabetes mellitus ([place])    Low carb diet and exercise.  Follow met b and a1c.        Hypertension    Blood pressure under good control.  Continue same medication regimen.  Follow pressures.  Follow metabolic panel.        Pneumonia    Recently evaluate and diagnosed with pneumonia  as outlined.  On levaquin.  Will extend out for 5 more days.  Take probiotic as directed.  Rest.  Fluids.  Schedule f/u.  Recheck cxr in 3-4 weeks to confirm clear.        Relevant Medications   levofloxacin (LEVAQUIN) 500 MG tablet    Other Visit Diagnoses    Leukocytosis, unspecified type    -  Primary   Relevant Orders   CBC with Differential/Platelet (Completed)   Hyponatremia       Relevant Orders   Basic metabolic panel (Completed)       Einar Pheasant, MD

## 2016-06-21 NOTE — Patient Instructions (Signed)
Take a probiotic daily while you are on antibiotics and for two weeks after completing the antibiotics.    Examples of probiotics:  Culturelle, florastor or align

## 2016-06-22 ENCOUNTER — Telehealth: Payer: Self-pay | Admitting: Internal Medicine

## 2016-06-22 ENCOUNTER — Other Ambulatory Visit: Payer: Self-pay

## 2016-06-22 ENCOUNTER — Telehealth: Payer: Self-pay | Admitting: *Deleted

## 2016-06-22 ENCOUNTER — Other Ambulatory Visit: Payer: Self-pay | Admitting: Internal Medicine

## 2016-06-22 DIAGNOSIS — E871 Hypo-osmolality and hyponatremia: Secondary | ICD-10-CM

## 2016-06-22 LAB — CULTURE, BLOOD (ROUTINE X 2)
Culture: NO GROWTH
Culture: NO GROWTH
SPECIAL REQUESTS: ADEQUATE

## 2016-06-22 MED ORDER — GLUCOSE BLOOD VI STRP: ORAL_STRIP | 2 refills | Status: DC

## 2016-06-22 NOTE — Telephone Encounter (Signed)
See lab note.  

## 2016-06-22 NOTE — Telephone Encounter (Signed)
Re faxed with DX code.

## 2016-06-22 NOTE — Telephone Encounter (Signed)
Pt called back returning your call. Please advise, thank you!  Call pt @ [phone]

## 2016-06-22 NOTE — Progress Notes (Signed)
Order placed for f/u sodium.  ?

## 2016-06-22 NOTE — Telephone Encounter (Signed)
[facility] has requested a diagnosis code for he Aviva plus test strips and lancets  Contact [phone]

## 2016-06-24 DIAGNOSIS — J189 Pneumonia, unspecified organism: Secondary | ICD-10-CM | POA: Insufficient documentation

## 2016-06-24 NOTE — Assessment & Plan Note (Signed)
Has had GI w/up as outlined.  Recheck cbc.

## 2016-06-24 NOTE — Assessment & Plan Note (Signed)
Blood pressure under good control.  Continue same medication regimen.  Follow pressures.  Follow metabolic panel.   

## 2016-06-24 NOTE — Assessment & Plan Note (Signed)
Low carb diet and exercise.  Follow met b and a1c.   

## 2016-06-24 NOTE — Assessment & Plan Note (Signed)
Recently evaluate and diagnosed with pneumonia as outlined.  On levaquin.  Will extend out for 5 more days.  Take probiotic as directed.  Rest.  Fluids.  Schedule f/u.  Recheck cxr in 3-4 weeks to confirm clear.

## 2016-07-07 ENCOUNTER — Other Ambulatory Visit: Payer: Self-pay | Admitting: Internal Medicine

## 2016-07-07 ENCOUNTER — Other Ambulatory Visit (INDEPENDENT_AMBULATORY_CARE_PROVIDER_SITE_OTHER): Payer: Medicare Other

## 2016-07-07 DIAGNOSIS — D649 Anemia, unspecified: Secondary | ICD-10-CM | POA: Diagnosis not present

## 2016-07-07 DIAGNOSIS — I10 Essential (primary) hypertension: Secondary | ICD-10-CM

## 2016-07-07 DIAGNOSIS — E7800 Pure hypercholesterolemia, unspecified: Secondary | ICD-10-CM

## 2016-07-07 DIAGNOSIS — E119 Type 2 diabetes mellitus without complications: Secondary | ICD-10-CM | POA: Diagnosis not present

## 2016-07-07 DIAGNOSIS — E871 Hypo-osmolality and hyponatremia: Secondary | ICD-10-CM

## 2016-07-07 DIAGNOSIS — R7989 Other specified abnormal findings of blood chemistry: Secondary | ICD-10-CM

## 2016-07-07 LAB — CBC WITH DIFFERENTIAL/PLATELET
Basophils Absolute: 0.1 10*3/uL (ref 0.0–0.1)
Basophils Relative: 1.4 % (ref 0.0–3.0)
EOS PCT: 1.6 % (ref 0.0–5.0)
Eosinophils Absolute: 0.1 10*3/uL (ref 0.0–0.7)
HCT: 32.7 % — ABNORMAL LOW (ref 36.0–46.0)
Hemoglobin: 10.3 g/dL — ABNORMAL LOW (ref 12.0–15.0)
LYMPHS ABS: 1.5 10*3/uL (ref 0.7–4.0)
Lymphocytes Relative: 24.4 % (ref 12.0–46.0)
MCHC: 31.4 g/dL (ref 30.0–36.0)
MCV: 78.7 fl (ref 78.0–100.0)
MONO ABS: 0.5 10*3/uL (ref 0.1–1.0)
Monocytes Relative: 7.3 % (ref 3.0–12.0)
NEUTROS PCT: 65.3 % (ref 43.0–77.0)
Neutro Abs: 4 10*3/uL (ref 1.4–7.7)
PLATELETS: 326 10*3/uL (ref 150.0–400.0)
RBC: 4.15 Mil/uL (ref 3.87–5.11)
RDW: 17.8 % — AB (ref 11.5–15.5)
WBC: 6.2 10*3/uL (ref 4.0–10.5)

## 2016-07-07 LAB — LIPID PANEL
CHOLESTEROL: 148 mg/dL (ref 0–200)
HDL: 49.2 mg/dL (ref >39.00)
LDL CALC: 84 mg/dL (ref 0–99)
NonHDL: 99.14
Total CHOL/HDL Ratio: 3
Triglycerides: 74 mg/dL (ref 0.0–149.0)
VLDL: 14.8 mg/dL (ref 0.0–40.0)

## 2016-07-07 LAB — BASIC METABOLIC PANEL
BUN: 12 mg/dL (ref 6–23)
CALCIUM: 9.3 mg/dL (ref 8.4–10.5)
CO2: 27 mEq/L (ref 19–32)
Chloride: 106 mEq/L (ref 96–112)
Creatinine, Ser: 0.8 mg/dL (ref 0.40–1.20)
GFR: 88.93 mL/min (ref >60.00)
Glucose, Bld: 115 mg/dL — ABNORMAL HIGH (ref 70–99)
Potassium: 4.5 mEq/L (ref 3.5–5.1)
Sodium: 140 mEq/L (ref 135–145)

## 2016-07-07 LAB — HEPATIC FUNCTION PANEL
ALK PHOS: 34 U/L — AB (ref 39–117)
ALT: 14 U/L (ref 0–35)
AST: 15 U/L (ref 0–37)
Albumin: 4.1 g/dL (ref 3.5–5.2)
BILIRUBIN TOTAL: 0.4 mg/dL (ref 0.2–1.2)
Bilirubin, Direct: 0.1 mg/dL (ref 0.0–0.3)
Total Protein: 7.3 g/dL (ref 6.0–8.3)

## 2016-07-07 LAB — HEMOGLOBIN A1C: HEMOGLOBIN A1C: 6.8 % — AB (ref 4.6–6.5)

## 2016-07-07 LAB — TSH: TSH: 0.21 u[IU]/mL — AB (ref 0.35–4.50)

## 2016-07-07 LAB — FERRITIN: FERRITIN: 136.6 ng/mL (ref 10.0–291.0)

## 2016-07-07 NOTE — Progress Notes (Signed)
Order placed for add on labs.   °

## 2016-07-10 LAB — T4, FREE: Free T4: 1.2 ng/dL (ref 0.8–1.8)

## 2016-07-10 LAB — T3, FREE: T3 FREE: 2.9 pg/mL (ref 2.3–4.2)

## 2016-07-10 NOTE — Addendum Note (Signed)
Addended by: Leeanne Rio on: 07/10/2016 12:30 PM   Modules accepted: Orders

## 2016-07-10 NOTE — Addendum Note (Signed)
Addended by: Leeanne Rio on: 07/10/2016 12:29 PM   Modules accepted: Orders

## 2016-07-11 ENCOUNTER — Other Ambulatory Visit: Payer: Self-pay | Admitting: Internal Medicine

## 2016-07-11 DIAGNOSIS — R7989 Other specified abnormal findings of blood chemistry: Secondary | ICD-10-CM

## 2016-07-11 NOTE — Progress Notes (Signed)
Order placed for f/u thyroid function tests.  

## 2016-07-11 NOTE — Addendum Note (Signed)
Addended by: Arby Barrette on: 07/11/2016 08:46 AM   Modules accepted: Orders

## 2016-07-26 ENCOUNTER — Ambulatory Visit (INDEPENDENT_AMBULATORY_CARE_PROVIDER_SITE_OTHER): Payer: Medicare Other | Admitting: Internal Medicine

## 2016-07-26 ENCOUNTER — Encounter: Payer: Self-pay | Admitting: Internal Medicine

## 2016-07-26 ENCOUNTER — Ambulatory Visit (INDEPENDENT_AMBULATORY_CARE_PROVIDER_SITE_OTHER): Payer: Medicare Other

## 2016-07-26 VITALS — BP 110/66 | HR 82 | Temp 98.4°F | Resp 16 | Ht 62.0 in | Wt 141.8 lb

## 2016-07-26 DIAGNOSIS — I10 Essential (primary) hypertension: Secondary | ICD-10-CM | POA: Diagnosis not present

## 2016-07-26 DIAGNOSIS — E7800 Pure hypercholesterolemia, unspecified: Secondary | ICD-10-CM

## 2016-07-26 DIAGNOSIS — M79606 Pain in leg, unspecified: Secondary | ICD-10-CM

## 2016-07-26 DIAGNOSIS — R1032 Left lower quadrant pain: Secondary | ICD-10-CM

## 2016-07-26 DIAGNOSIS — R200 Anesthesia of skin: Secondary | ICD-10-CM

## 2016-07-26 DIAGNOSIS — D649 Anemia, unspecified: Secondary | ICD-10-CM | POA: Diagnosis not present

## 2016-07-26 DIAGNOSIS — J189 Pneumonia, unspecified organism: Secondary | ICD-10-CM

## 2016-07-26 DIAGNOSIS — R202 Paresthesia of skin: Secondary | ICD-10-CM | POA: Diagnosis not present

## 2016-07-26 DIAGNOSIS — J181 Lobar pneumonia, unspecified organism: Secondary | ICD-10-CM

## 2016-07-26 DIAGNOSIS — R109 Unspecified abdominal pain: Secondary | ICD-10-CM | POA: Insufficient documentation

## 2016-07-26 DIAGNOSIS — E119 Type 2 diabetes mellitus without complications: Secondary | ICD-10-CM

## 2016-07-26 LAB — URINALYSIS, ROUTINE W REFLEX MICROSCOPIC
BILIRUBIN URINE: NEGATIVE
HGB URINE DIPSTICK: NEGATIVE
Ketones, ur: NEGATIVE
LEUKOCYTES UA: NEGATIVE
NITRITE: NEGATIVE
PH: 7 (ref 5.0–8.0)
RBC / HPF: NONE SEEN (ref 0–?)
Specific Gravity, Urine: 1.015 (ref 1.000–1.030)
TOTAL PROTEIN, URINE-UPE24: NEGATIVE
URINE GLUCOSE: NEGATIVE
Urobilinogen, UA: 0.2 (ref 0.0–1.0)
WBC, UA: NONE SEEN (ref 0–?)

## 2016-07-27 ENCOUNTER — Encounter: Payer: Self-pay | Admitting: Internal Medicine

## 2016-07-27 NOTE — Progress Notes (Unsigned)
Patient informed no questions  

## 2016-07-28 ENCOUNTER — Encounter: Payer: Self-pay | Admitting: Internal Medicine

## 2016-07-28 NOTE — Assessment & Plan Note (Signed)
Improved.  MRI - spinal stenosis.  On gabapentin.

## 2016-07-28 NOTE — Assessment & Plan Note (Signed)
Blood pressure under good control.  Continue same medication regimen.  Follow pressures.  Follow metabolic panel.   

## 2016-07-28 NOTE — Assessment & Plan Note (Signed)
Low carb diet and exercise.  Follow met b and a1c.  Up to date with eye exams.   

## 2016-07-28 NOTE — Assessment & Plan Note (Signed)
Low cholesterol diet and exercise.  Follow lipid panel.   

## 2016-07-28 NOTE — Progress Notes (Signed)
[name], female   DOB: 1935-08-15, 81 y.o.   MRN: [id number]   Subjective:    [name], female    DOB: [birth year], 81 y.o.   MRN: [id number]  HPI  Patient here for a scheduled follow up.  She was recently evaluated by neurology for extremity numbness and tingling as outlined.  Neurology note reviewed.  MRI - arthritis.  NCS - polyneuropathy.  On gabapentin.  Recommended f/u in one year.  States her leg is better.  She was recently evaluated and diagnosed with pneumonia.  CXR with LLL pneumonia as outlined. Was treated with levaquin.  No cough or congestion now.  No chest pain or sob.  No acid reflux.  Does report LLQ discomfort.  Started one week ago.  Was located across her lower abdomen initially.  Now just localized to her LLQ.  No nausea or vomiting.  No diarrhea.  No dysuria or hematuria.  Pain is better.  Appears to only occur in certain positions.     Past Medical History:  Diagnosis Date  . Anemia   . Diabetes mellitus ([place])   . Hypercholesterolemia   . Hypertension   . Hypothyroidism   . Osteoporosis    Past Surgical History:  Procedure Laterality Date  . CYST EXCISION     low back  . THYROID SURGERY  1992   left hemi-thyroidectomy (colloid goiter0   Family History  Problem Relation Age of Onset  . Emphysema Father   . Heart disease Mother        myocardial infarction (69)  . Hypertension Mother   . Diabetes Mother   . Heart disease Brother        drug use  . Diabetes Mellitus II Sister        x3  . Hypertension Sister   . Breast cancer Unknown        niece  . Breast cancer Sister   . Colon cancer Neg Hx    Social History   Social History  . Marital status: Married    Spouse name: N/A  . Number of children: 2  . Years of education: N/A   Social History Main Topics  . Smoking status: Never Smoker  . Smokeless tobacco: Never Used  . Alcohol use No     Comment: occasional  . Drug use: No  . Sexual activity: Not Asked     Other Topics Concern  . None   Social History Narrative  . None    Outpatient Encounter Prescriptions as of 07/26/2016  Medication Sig  . albuterol (PROVENTIL HFA;VENTOLIN HFA) 108 (90 Base) MCG/ACT inhaler Inhale into the lungs every 6 (six) hours as needed for wheezing or shortness of breath.  Marland Kitchen aspirin 81 MG tablet Take 81 mg by mouth daily.  . calcium-vitamin D (OSCAL WITH D) 250-125 MG-UNIT tablet Take 1 tablet by mouth daily.  Marland Kitchen gabapentin (NEURONTIN) 100 MG capsule TAKE 2 CAPSULE (100 MG TOTAL) BY MOUTH three TIMES DAILY.  Marland Kitchen glipiZIDE (GLUCOTROL XL) 5 MG 24 hr tablet Take 1 tablet (5 mg total) by mouth daily.  Marland Kitchen glucose blood test strip USE TO CHECK ONCE DAILY  . lisinopril (PRINIVIL,ZESTRIL) 10 MG tablet Take 1 tablet (10 mg total) by mouth daily.  . metFORMIN (GLUCOPHAGE) 500 MG tablet Take 2 tablets (1,000 mg total) by mouth 2 (two) times daily.  . Multiple Vitamin (MULTIVITAMIN) tablet Take 1 tablet by mouth daily.  Marland Kitchen omeprazole (PRILOSEC) 20 MG capsule Take 1  capsule (20 mg total) by mouth daily.  . [DISCONTINUED] glucose blood (ACCU-CHEK COMPACT PLUS) test strip CHECK ONCE DAILY  . [DISCONTINUED] levofloxacin (LEVAQUIN) 500 MG tablet Take 1 tablet (500 mg total) by mouth daily.   No facility-administered encounter medications on file as of 07/26/2016.     Review of Systems  Constitutional: Negative for appetite change and unexpected weight change.  HENT: Negative for congestion and sinus pressure.   Respiratory: Negative for cough, chest tightness and shortness of breath.   Cardiovascular: Negative for chest pain, palpitations and leg swelling.  Gastrointestinal: Positive for abdominal pain. Negative for diarrhea, nausea and vomiting.       LLQ pain as outlined.    Genitourinary: Negative for difficulty urinating, dysuria and hematuria.  Musculoskeletal: Negative for back pain, joint swelling and myalgias.  Skin: Negative for color change and rash.  Neurological:  Negative for dizziness, light-headedness and headaches.  Psychiatric/Behavioral: Negative for agitation and dysphoric mood.       Objective:    Physical Exam  Constitutional: She appears well-developed and well-nourished. No distress.  HENT:  Nose: Nose normal.  Mouth/Throat: Oropharynx is clear and moist.  Neck: Neck supple. No thyromegaly present.  Cardiovascular: Normal rate and regular rhythm.   Pulmonary/Chest: Breath sounds normal. No respiratory distress. She has no wheezes.  Abdominal: Soft. Bowel sounds are normal. There is no tenderness.  No tenderness to palpation.  Palpation does not reproduce symptoms.    Musculoskeletal: She exhibits no edema or tenderness.  Lymphadenopathy:    She has no cervical adenopathy.  Skin: No rash noted. No erythema.  Psychiatric: She has a normal mood and affect. Her behavior is normal.    BP 110/66   Pulse 82   Temp 98.4 F (36.9 C) (Oral)   Resp 16   Ht _0  (1.575 m)   Wt 141 lb 12.8 oz (64.3 kg)   SpO2 98%   BMI 25.94 kg/m  Wt Readings from Last 3 Encounters:  07/26/16 141 lb 12.8 oz (64.3 kg)  06/21/16 139 lb (63 kg)  06/17/16 140 lb (63.5 kg)     Lab Results  Component Value Date   WBC 6.2 07/07/2016   HGB 10.3 (L) 07/07/2016   HCT 32.7 (L) 07/07/2016   PLT 326.0 07/07/2016   GLUCOSE 115 (H) 07/07/2016   CHOL 148 07/07/2016   TRIG 74.0 07/07/2016   HDL 49.20 07/07/2016   LDLCALC 84 07/07/2016   ALT 14 07/07/2016   AST 15 07/07/2016   NA 140 07/07/2016   K 4.5 07/07/2016   CL 106 07/07/2016   CREATININE 0.80 07/07/2016   BUN 12 07/07/2016   CO2 27 07/07/2016   TSH 0.21 (L) 07/07/2016   HGBA1C 6.8 (H) 07/07/2016   MICROALBUR 1.3 06/12/2014    Dg Chest 2 View  Result Date: 06/17/2016 CLINICAL DATA:  Cough. EXAM: CHEST  2 VIEW COMPARISON:  None. FINDINGS: Patchy opacity at the left lateral lung base on the frontal view, in the anterior aspect of the lower lobe on the lateral view. The remainder of the lungs  are clear. Normal sized heart. Tortuous aorta. Minimal thoracic spine degenerative changes. IMPRESSION: Mild left lower lobe pneumonia. Electronically Signed   By: Claudie Revering M.D.   On: 06/17/2016 17:06       Assessment & Plan:   Problem List Items Addressed This Visit    Abdominal pain    LLQ pain as outlined.  No pain on exam.  Does not worsen with  palpation.  Is better.  Taking tylenol.  Tylenol helps.  Aggravated by certain positions.  Considered hernia, but no hernia noted on exam.  Check urinalysis.        Relevant Orders   Urinalysis, Routine w reflex microscopic (Completed)   Anemia    Has had GI w/up.  Follow cbc.   Lab Results  Component Value Date   WBC 6.2 07/07/2016   HGB 10.3 (L) 07/07/2016   HCT 32.7 (L) 07/07/2016   MCV 78.7 07/07/2016   PLT 326.0 07/07/2016        Diabetes mellitus ([facility])    Low carb diet and exercise.  Follow met b and a1c.  Up to date with eye exams.        Hypercholesterolemia    Low cholesterol diet and exercise.  Follow lipid panel.        Hypertension    Blood pressure under good control.  Continue same medication regimen.  Follow pressures.  Follow metabolic panel.        Leg pain    Improved.  MRI - spinal stenosis.  On gabapentin.        Numbness and tingling    Previous numbness and tingling.  Saw neurology.  MRI as outlined.  NCS - polyneuropathy.  On gabapentin.  Symptoms resolved.  Follow.        Pneumonia - Primary    Recently diagnosed with pneumonia.  Treated with levaquin.  No current symptoms.  Recheck cxr.       Relevant Orders   DG Chest 2 View (Completed)       Einar Pheasant, MD

## 2016-07-28 NOTE — Assessment & Plan Note (Signed)
Has had GI w/up.  Follow cbc.   Lab Results  Component Value Date   WBC 6.2 07/07/2016   HGB 10.3 (L) 07/07/2016   HCT 32.7 (L) 07/07/2016   MCV 78.7 07/07/2016   PLT 326.0 07/07/2016

## 2016-07-28 NOTE — Assessment & Plan Note (Signed)
LLQ pain as outlined.  No pain on exam.  Does not worsen with palpation.  Is better.  Taking tylenol.  Tylenol helps.  Aggravated by certain positions.  Considered hernia, but no hernia noted on exam.  Check urinalysis.

## 2016-07-28 NOTE — Assessment & Plan Note (Signed)
Previous numbness and tingling.  Saw neurology.  MRI as outlined.  NCS - polyneuropathy.  On gabapentin.  Symptoms resolved.  Follow.

## 2016-07-28 NOTE — Assessment & Plan Note (Signed)
Recently diagnosed with pneumonia.  Treated with levaquin.  No current symptoms.  Recheck cxr.

## 2016-08-02 NOTE — Telephone Encounter (Signed)
Hard copy mailed  

## 2016-08-15 ENCOUNTER — Other Ambulatory Visit (INDEPENDENT_AMBULATORY_CARE_PROVIDER_SITE_OTHER): Payer: Medicare Other

## 2016-08-15 DIAGNOSIS — R946 Abnormal results of thyroid function studies: Secondary | ICD-10-CM | POA: Diagnosis not present

## 2016-08-15 DIAGNOSIS — R7989 Other specified abnormal findings of blood chemistry: Secondary | ICD-10-CM

## 2016-08-15 LAB — T4, FREE: Free T4: 0.9 ng/dL (ref 0.60–1.60)

## 2016-08-15 LAB — TSH: TSH: 0.45 u[IU]/mL (ref 0.35–4.50)

## 2016-08-15 LAB — T3, FREE: T3, Free: 3.1 pg/mL (ref 2.3–4.2)

## 2016-08-16 ENCOUNTER — Encounter: Payer: Self-pay | Admitting: Internal Medicine

## 2016-09-18 ENCOUNTER — Telehealth: Payer: Self-pay | Admitting: *Deleted

## 2016-09-18 DIAGNOSIS — I10 Essential (primary) hypertension: Secondary | ICD-10-CM

## 2016-09-18 DIAGNOSIS — E119 Type 2 diabetes mellitus without complications: Secondary | ICD-10-CM

## 2016-09-18 DIAGNOSIS — D649 Anemia, unspecified: Secondary | ICD-10-CM

## 2016-09-18 DIAGNOSIS — E7800 Pure hypercholesterolemia, unspecified: Secondary | ICD-10-CM

## 2016-09-18 DIAGNOSIS — E039 Hypothyroidism, unspecified: Secondary | ICD-10-CM

## 2016-09-18 NOTE — Telephone Encounter (Signed)
Pt has lab appt on 09/19/16. Please place future orders

## 2016-09-19 ENCOUNTER — Other Ambulatory Visit (INDEPENDENT_AMBULATORY_CARE_PROVIDER_SITE_OTHER): Payer: Medicare Other

## 2016-09-19 ENCOUNTER — Ambulatory Visit (INDEPENDENT_AMBULATORY_CARE_PROVIDER_SITE_OTHER): Payer: Medicare Other | Admitting: *Deleted

## 2016-09-19 DIAGNOSIS — E7800 Pure hypercholesterolemia, unspecified: Secondary | ICD-10-CM

## 2016-09-19 DIAGNOSIS — E119 Type 2 diabetes mellitus without complications: Secondary | ICD-10-CM | POA: Diagnosis not present

## 2016-09-19 DIAGNOSIS — E039 Hypothyroidism, unspecified: Secondary | ICD-10-CM

## 2016-09-19 DIAGNOSIS — Z23 Encounter for immunization: Secondary | ICD-10-CM

## 2016-09-19 DIAGNOSIS — D649 Anemia, unspecified: Secondary | ICD-10-CM | POA: Diagnosis not present

## 2016-09-19 LAB — CBC WITH DIFFERENTIAL/PLATELET
BASOS ABS: 0.1 10*3/uL (ref 0.0–0.1)
Basophils Relative: 1.4 % (ref 0.0–3.0)
EOS ABS: 0.1 10*3/uL (ref 0.0–0.7)
Eosinophils Relative: 1.9 % (ref 0.0–5.0)
HCT: 36.4 % (ref 36.0–46.0)
Hemoglobin: 11.4 g/dL — ABNORMAL LOW (ref 12.0–15.0)
LYMPHS ABS: 2.1 10*3/uL (ref 0.7–4.0)
Lymphocytes Relative: 27.8 % (ref 12.0–46.0)
MCHC: 31.4 g/dL (ref 30.0–36.0)
MCV: 78.5 fl (ref 78.0–100.0)
Monocytes Absolute: 0.7 10*3/uL (ref 0.1–1.0)
Monocytes Relative: 9.2 % (ref 3.0–12.0)
NEUTROS ABS: 4.6 10*3/uL (ref 1.4–7.7)
NEUTROS PCT: 59.7 % (ref 43.0–77.0)
PLATELETS: 281 10*3/uL (ref 150.0–400.0)
RBC: 4.63 Mil/uL (ref 3.87–5.11)
RDW: 17.4 % — ABNORMAL HIGH (ref 11.5–15.5)
WBC: 7.7 10*3/uL (ref 4.0–10.5)

## 2016-09-19 LAB — LIPID PANEL
CHOLESTEROL: 182 mg/dL (ref 0–200)
HDL: 54.3 mg/dL (ref >39.00)
LDL CALC: 114 mg/dL — AB (ref 0–99)
NonHDL: 128.16
TRIGLYCERIDES: 69 mg/dL (ref 0.0–149.0)
Total CHOL/HDL Ratio: 3
VLDL: 13.8 mg/dL (ref 0.0–40.0)

## 2016-09-19 LAB — TSH: TSH: 0.57 u[IU]/mL (ref 0.35–4.50)

## 2016-09-19 LAB — HEPATIC FUNCTION PANEL
ALT: 14 U/L (ref 0–35)
AST: 14 U/L (ref 0–37)
Albumin: 4.2 g/dL (ref 3.5–5.2)
Alkaline Phosphatase: 40 U/L (ref 39–117)
BILIRUBIN TOTAL: 0.3 mg/dL (ref 0.2–1.2)
Bilirubin, Direct: 0.1 mg/dL (ref 0.0–0.3)
Total Protein: 7.1 g/dL (ref 6.0–8.3)

## 2016-09-19 LAB — FERRITIN: Ferritin: 140.6 ng/mL (ref 10.0–291.0)

## 2016-09-19 LAB — HEMOGLOBIN A1C: HEMOGLOBIN A1C: 6.8 % — AB (ref 4.6–6.5)

## 2016-09-19 LAB — BASIC METABOLIC PANEL
BUN: 27 mg/dL — AB (ref 6–23)
CO2: 21 meq/L (ref 19–32)
Calcium: 9.6 mg/dL (ref 8.4–10.5)
Chloride: 104 mEq/L (ref 96–112)
Creatinine, Ser: 0.97 mg/dL (ref 0.40–1.20)
GFR: 71.16 mL/min (ref >60.00)
GLUCOSE: 140 mg/dL — AB (ref 70–99)
POTASSIUM: 4.2 meq/L (ref 3.5–5.1)
Sodium: 136 mEq/L (ref 135–145)

## 2016-09-19 LAB — VITAMIN B12: VITAMIN B 12: 717 pg/mL (ref 211–911)

## 2016-09-19 NOTE — Telephone Encounter (Signed)
Orders placed for labs

## 2016-09-19 NOTE — Addendum Note (Signed)
Addended by: Leeanne Rio on: 09/19/2016 09:47 AM   Modules accepted: Orders

## 2016-09-20 ENCOUNTER — Encounter: Payer: Self-pay | Admitting: Internal Medicine

## 2016-09-21 ENCOUNTER — Encounter: Payer: Self-pay | Admitting: Internal Medicine

## 2016-09-21 ENCOUNTER — Ambulatory Visit (INDEPENDENT_AMBULATORY_CARE_PROVIDER_SITE_OTHER): Payer: Medicare Other | Admitting: Internal Medicine

## 2016-09-21 DIAGNOSIS — E7800 Pure hypercholesterolemia, unspecified: Secondary | ICD-10-CM

## 2016-09-21 DIAGNOSIS — R202 Paresthesia of skin: Secondary | ICD-10-CM

## 2016-09-21 DIAGNOSIS — I10 Essential (primary) hypertension: Secondary | ICD-10-CM | POA: Diagnosis not present

## 2016-09-21 DIAGNOSIS — D649 Anemia, unspecified: Secondary | ICD-10-CM | POA: Diagnosis not present

## 2016-09-21 DIAGNOSIS — R200 Anesthesia of skin: Secondary | ICD-10-CM

## 2016-09-21 DIAGNOSIS — E119 Type 2 diabetes mellitus without complications: Secondary | ICD-10-CM | POA: Diagnosis not present

## 2016-09-21 LAB — HM DIABETES FOOT EXAM

## 2016-09-21 LAB — MICROALBUMIN / CREATININE URINE RATIO
CREATININE, U: 211.6 mg/dL
MICROALB/CREAT RATIO: 0.7 mg/g (ref 0.0–30.0)
Microalb, Ur: 1.5 mg/dL (ref 0.0–1.9)

## 2016-09-21 MED ORDER — GABAPENTIN 300 MG PO CAPS
300.0000 mg | ORAL_CAPSULE | Freq: Three times a day (TID) | ORAL | 2 refills | Status: DC
Start: 2016-09-21 — End: 2017-04-09

## 2016-09-21 NOTE — Progress Notes (Signed)
[name], female   DOB: 1935-08-01, 81 y.o.   MRN: [id number]   Subjective:    [name], female    DOB: 05-15-1935, 81 y.o.   MRN: [id number]  HPI  Patient here for her physical exam.  She is doing relatively well.  Still having issues with her legs.  Had NCS - revealed severe bilateral lower extremity polyneuropathy.  Taking gabapentin 366m bid.  Still with increased pain.  Discussed increasing dose of gabapentin to help control pain.  States otherwise doing well.  No chest pain.  No sob.  No acid reflux.  No abdominal pain.  Bowels moving.     Past Medical History:  Diagnosis Date  . Anemia   . [place]   . Hypercholesterolemia   . Hypertension   . Hypothyroidism   . Osteoporosis    Past Surgical History:  Procedure Laterality Date  . CYST EXCISION     low back  . THYROID SURGERY  1992   left hemi-thyroidectomy (colloid goiter0   Family History  Problem Relation Age of Onset  . Emphysema Father   . Heart disease Mother        myocardial infarction (69)  . Hypertension Mother   . Diabetes Mother   . Heart disease Brother        drug use  . Diabetes Mellitus II Sister        x3  . Hypertension Sister   . Breast cancer Unknown        niece  . Breast cancer Sister   . Colon cancer Neg Hx    Social History   Social History  . Marital status: Married    Spouse name: N/A  . Number of children: 2  . Years of education: N/A   Social History Main Topics  . Smoking status: Never Smoker  . Smokeless tobacco: Never Used  . Alcohol use No     Comment: occasional  . Drug use: No  . Sexual activity: Not Asked   Other Topics Concern  . None   Social History Narrative  . None    Outpatient Encounter Prescriptions as of 09/21/2016  Medication Sig  . aspirin 81 MG tablet Take 81 mg by mouth daily.  . calcium-vitamin D (OSCAL WITH D) 250-125 MG-UNIT tablet Take 1 tablet by mouth daily.  .Marland KitchenglipiZIDE (GLUCOTROL XL) 5  MG 24 hr tablet Take 1 tablet (5 mg total) by mouth daily.  .Marland Kitchenglucose blood test strip USE TO CHECK ONCE DAILY  . lisinopril (PRINIVIL,ZESTRIL) 10 MG tablet Take 1 tablet (10 mg total) by mouth daily.  . metFORMIN (GLUCOPHAGE) 500 MG tablet Take 2 tablets (1,000 mg total) by mouth 2 (two) times daily.  . Multiple Vitamin (MULTIVITAMIN) tablet Take 1 tablet by mouth daily.  .Marland Kitchenomeprazole (PRILOSEC) 20 MG capsule Take 1 capsule (20 mg total) by mouth daily.  . [DISCONTINUED] gabapentin (NEURONTIN) 100 MG capsule TAKE 2 CAPSULE (100 MG TOTAL) BY MOUTH three TIMES DAILY.  .Marland Kitchengabapentin (NEURONTIN) 300 MG capsule Take 1 capsule (300 mg total) by mouth 3 (three) times daily.  . [DISCONTINUED] albuterol (PROVENTIL HFA;VENTOLIN HFA) 108 (90 Base) MCG/ACT inhaler Inhale into the lungs every 6 (six) hours as needed for wheezing or shortness of breath.   No facility-administered encounter medications on file as of 09/21/2016.     Review of Systems  Constitutional: Negative for unexpected weight change.  HENT: Negative for congestion and sinus pressure.  Respiratory: Negative for cough, chest tightness and shortness of breath.   Cardiovascular: Negative for chest pain, palpitations and leg swelling.  Gastrointestinal: Negative for abdominal pain, diarrhea, nausea and vomiting.  Genitourinary: Negative for difficulty urinating and dysuria.  Musculoskeletal: Negative for back pain and joint swelling.       Leg pain as outlined.  Severe neuropathy.    Skin: Negative for color change and rash.  Neurological: Negative for dizziness, light-headedness and headaches.  Psychiatric/Behavioral: Negative for agitation and dysphoric mood.       Objective:     Blood pressure rechecked by me:  122/68  Physical Exam  Constitutional: She appears well-developed and well-nourished. No distress.  HENT:  Nose: Nose normal.  Mouth/Throat: Oropharynx is clear and moist.  Neck: Neck supple. No thyromegaly present.    Cardiovascular: Normal rate and regular rhythm.   Pulmonary/Chest: Breath sounds normal. No respiratory distress. She has no wheezes.  Abdominal: Soft. Bowel sounds are normal. There is no tenderness.  Musculoskeletal: She exhibits no edema or tenderness.  Lymphadenopathy:    She has no cervical adenopathy.  Neurological:  Can feel - light touch and pinprick.  No skin lesions.    Skin: No rash noted. No erythema.  Psychiatric: She has a normal mood and affect. Her behavior is normal.    BP 130/68 (BP Location: Left Arm, Patient Position: Sitting, Cuff Size: Normal)   Pulse 83   Temp 98.2 F (36.8 C) (Oral)   Resp 20   Ht 5' 1.81" (1.57 m)   Wt 136 lb 9.6 oz (62 kg)   SpO2 98%   BMI 25.14 kg/m  Wt Readings from Last 3 Encounters:  09/21/16 136 lb 9.6 oz (62 kg)  07/26/16 141 lb 12.8 oz (64.3 kg)  06/21/16 139 lb (63 kg)     Lab Results  Component Value Date   WBC 7.7 09/19/2016   HGB 11.4 (L) 09/19/2016   HCT 36.4 09/19/2016   PLT 281.0 09/19/2016   GLUCOSE 140 (H) 09/19/2016   CHOL 182 09/19/2016   TRIG 69.0 09/19/2016   HDL 54.30 09/19/2016   LDLCALC 114 (H) 09/19/2016   ALT 14 09/19/2016   AST 14 09/19/2016   NA 136 09/19/2016   K 4.2 09/19/2016   CL 104 09/19/2016   CREATININE 0.97 09/19/2016   BUN 27 (H) 09/19/2016   CO2 21 09/19/2016   TSH 0.57 09/19/2016   HGBA1C 6.8 (H) 09/19/2016   MICROALBUR 1.5 09/21/2016       Assessment & Plan:   Problem List Items Addressed This Visit    Anemia    hgb improved - 11.4.  Follow.  Had had previous EGD and colonoscopy as outlined.        Relevant Orders   CBC with Differential/Platelet   Ferritin   Diabetes mellitus ([place])    Low carb diet and exercise.  Follow met b and a1c.   Lab Results  Component Value Date   HGBA1C 6.8 (H) 09/19/2016        Relevant Orders   Hemoglobin A1c   Hypercholesterolemia    Low cholesterol diet and exercise.  Follow lipid panel.   Lab Results  Component Value Date    CHOL 182 09/19/2016   HDL 54.30 09/19/2016   LDLCALC 114 (H) 09/19/2016   TRIG 69.0 09/19/2016   CHOLHDL 3 09/19/2016        Relevant Orders   Hepatic function panel   Lipid panel   Hypertension  Blood pressure under good control.  Continue same medication regimen.  Follow pressures.  Follow metabolic panel.        Relevant Orders   Basic metabolic panel   Numbness and tingling    Worked up by neurology.  Had MRI.  Also NCS - severe bilateral lower extremity polyneuropathy.  Increased gabapentin to 32m tid.            SEinar Pheasant MD

## 2016-09-23 ENCOUNTER — Encounter: Payer: Self-pay | Admitting: Internal Medicine

## 2016-09-23 NOTE — Assessment & Plan Note (Addendum)
Worked up by neurology.  Had MRI.  Also NCS - severe bilateral lower extremity polyneuropathy.  Increased gabapentin to 300mg  tid.

## 2016-09-23 NOTE — Assessment & Plan Note (Signed)
hgb improved - 11.4.  Follow.  Had had previous EGD and colonoscopy as outlined.

## 2016-09-23 NOTE — Assessment & Plan Note (Signed)
Low cholesterol diet and exercise.  Follow lipid panel.   Lab Results  Component Value Date   CHOL 182 09/19/2016   HDL 54.30 09/19/2016   LDLCALC 114 (H) 09/19/2016   TRIG 69.0 09/19/2016   CHOLHDL 3 09/19/2016

## 2016-09-23 NOTE — Assessment & Plan Note (Signed)
Blood pressure under good control.  Continue same medication regimen.  Follow pressures.  Follow metabolic panel.   

## 2016-09-23 NOTE — Assessment & Plan Note (Signed)
Low carb diet and exercise.  Follow met b and a1c.   Lab Results  Component Value Date   HGBA1C 6.8 (H) 09/19/2016

## 2017-01-11 LAB — HM DIABETES EYE EXAM

## 2017-01-23 ENCOUNTER — Other Ambulatory Visit (INDEPENDENT_AMBULATORY_CARE_PROVIDER_SITE_OTHER): Payer: Medicare Other

## 2017-01-23 DIAGNOSIS — E119 Type 2 diabetes mellitus without complications: Secondary | ICD-10-CM | POA: Diagnosis not present

## 2017-01-23 DIAGNOSIS — E7800 Pure hypercholesterolemia, unspecified: Secondary | ICD-10-CM

## 2017-01-23 DIAGNOSIS — D649 Anemia, unspecified: Secondary | ICD-10-CM | POA: Diagnosis not present

## 2017-01-23 DIAGNOSIS — I10 Essential (primary) hypertension: Secondary | ICD-10-CM

## 2017-01-23 LAB — LIPID PANEL
CHOLESTEROL: 192 mg/dL (ref 0–200)
HDL: 52 mg/dL (ref >39.00)
LDL CALC: 128 mg/dL — AB (ref 0–99)
NonHDL: 140.07
TRIGLYCERIDES: 61 mg/dL (ref 0.0–149.0)
Total CHOL/HDL Ratio: 4
VLDL: 12.2 mg/dL (ref 0.0–40.0)

## 2017-01-23 LAB — BASIC METABOLIC PANEL
BUN: 17 mg/dL (ref 6–23)
CHLORIDE: 102 meq/L (ref 96–112)
CO2: 27 mEq/L (ref 19–32)
Calcium: 9.5 mg/dL (ref 8.4–10.5)
Creatinine, Ser: 0.88 mg/dL (ref 0.40–1.20)
GFR: 79.55 mL/min (ref >60.00)
Glucose, Bld: 108 mg/dL — ABNORMAL HIGH (ref 70–99)
POTASSIUM: 4.5 meq/L (ref 3.5–5.1)
SODIUM: 136 meq/L (ref 135–145)

## 2017-01-23 LAB — HEPATIC FUNCTION PANEL
ALBUMIN: 4.3 g/dL (ref 3.5–5.2)
ALT: 15 U/L (ref 0–35)
AST: 13 U/L (ref 0–37)
Alkaline Phosphatase: 42 U/L (ref 39–117)
Bilirubin, Direct: 0 mg/dL (ref 0.0–0.3)
TOTAL PROTEIN: 7.6 g/dL (ref 6.0–8.3)
Total Bilirubin: 0.4 mg/dL (ref 0.2–1.2)

## 2017-01-23 LAB — CBC WITH DIFFERENTIAL/PLATELET
BASOS ABS: 0.1 10*3/uL (ref 0.0–0.1)
Basophils Relative: 1.1 % (ref 0.0–3.0)
Eosinophils Absolute: 0.1 10*3/uL (ref 0.0–0.7)
Eosinophils Relative: 1.7 % (ref 0.0–5.0)
HCT: 34.9 % — ABNORMAL LOW (ref 36.0–46.0)
Hemoglobin: 11 g/dL — ABNORMAL LOW (ref 12.0–15.0)
LYMPHS ABS: 2 10*3/uL (ref 0.7–4.0)
Lymphocytes Relative: 25.3 % (ref 12.0–46.0)
MCHC: 31.6 g/dL (ref 30.0–36.0)
MCV: 80.2 fl (ref 78.0–100.0)
MONO ABS: 0.6 10*3/uL (ref 0.1–1.0)
MONOS PCT: 7.8 % (ref 3.0–12.0)
NEUTROS ABS: 5.2 10*3/uL (ref 1.4–7.7)
NEUTROS PCT: 64.1 % (ref 43.0–77.0)
PLATELETS: 286 10*3/uL (ref 150.0–400.0)
RBC: 4.34 Mil/uL (ref 3.87–5.11)
RDW: 17.1 % — ABNORMAL HIGH (ref 11.5–15.5)
WBC: 8.1 10*3/uL (ref 4.0–10.5)

## 2017-01-23 LAB — HEMOGLOBIN A1C: HEMOGLOBIN A1C: 6.8 % — AB (ref 4.6–6.5)

## 2017-01-23 LAB — FERRITIN: FERRITIN: 117.3 ng/mL (ref 10.0–291.0)

## 2017-01-24 ENCOUNTER — Encounter: Payer: Self-pay | Admitting: Internal Medicine

## 2017-01-25 ENCOUNTER — Encounter: Payer: Self-pay | Admitting: Internal Medicine

## 2017-01-25 ENCOUNTER — Ambulatory Visit: Payer: Medicare Other | Admitting: Internal Medicine

## 2017-01-25 DIAGNOSIS — E119 Type 2 diabetes mellitus without complications: Secondary | ICD-10-CM

## 2017-01-25 DIAGNOSIS — Z23 Encounter for immunization: Secondary | ICD-10-CM | POA: Diagnosis not present

## 2017-01-25 DIAGNOSIS — D649 Anemia, unspecified: Secondary | ICD-10-CM

## 2017-01-25 DIAGNOSIS — I10 Essential (primary) hypertension: Secondary | ICD-10-CM | POA: Diagnosis not present

## 2017-01-25 DIAGNOSIS — E7800 Pure hypercholesterolemia, unspecified: Secondary | ICD-10-CM

## 2017-01-25 MED ORDER — ATORVASTATIN CALCIUM 10 MG PO TABS: 10.0000 mg | ORAL_TABLET | Freq: Every day | ORAL | 1 refills | Status: DC

## 2017-01-25 NOTE — Progress Notes (Signed)
[name], female   DOB: 03/19/1935, 81 y.o.   MRN: [id number]   Subjective:    [name], female    DOB: 05/28/1935, 81 y.o.   MRN: [id number]  HPI  Patient here for a scheduled follow up.  She reports she is doing well.  Legs better on gabapentin.  Taking regularly.  No chest pain.  No sob.  Stays active.  No acid reflux.  No abdominal pain or cramping.  Bowels moving. Discussed lab results.  Discussed cholesterol labs.  Discussed treatment.  She has tried pravastatin and crestor previously.  Agreeable to try lipitor.       Past Medical History:  Diagnosis Date  . Anemia   . Diabetes mellitus ([facility])   . Hypercholesterolemia   . Hypertension   . Hypothyroidism   . Osteoporosis    Past Surgical History:  Procedure Laterality Date  . CYST EXCISION     low back  . THYROID SURGERY  1992   left hemi-thyroidectomy (colloid goiter0   Family History  Problem Relation Age of Onset  . Emphysema Father   . Heart disease Mother        myocardial infarction (69)  . Hypertension Mother   . Diabetes Mother   . Heart disease Brother        drug use  . Diabetes Mellitus II Sister        x3  . Hypertension Sister   . Breast cancer Unknown        niece  . Breast cancer Sister   . Colon cancer Neg Hx    Social History   Socioeconomic History  . Marital status: Married    Spouse name: None  . Number of children: 2  . Years of education: None  . Highest education level: None  Social Needs  . Financial resource strain: None  . Food insecurity - worry: None  . Food insecurity - inability: None  . Transportation needs - medical: None  . Transportation needs - non-medical: None  Occupational History  . None  Tobacco Use  . Smoking status: Never Smoker  . Smokeless tobacco: Never Used  Substance and Sexual Activity  . Alcohol use: No    Alcohol/week: 0.0 oz    Comment: occasional  . Drug use: No  . Sexual activity: None  Other Topics Concern    . None  Social History Narrative  . None    Outpatient Encounter Medications as of 01/25/2017  Medication Sig  . albuterol (PROVENTIL HFA) 108 (90 Base) MCG/ACT inhaler Inhale into the lungs.  . ACCU-CHEK SOFTCLIX LANCETS lancets CHECK BLOOD SUGAR ONCE A DAY DX E11.9  . aspirin 81 MG tablet Take 81 mg by mouth daily.  . atorvastatin (LIPITOR) 10 MG tablet Take 1 tablet (10 mg total) by mouth daily.  . calcium-vitamin D (OSCAL WITH D) 250-125 MG-UNIT tablet Take 1 tablet by mouth daily.  . gabapentin (NEURONTIN) 300 MG capsule Take 1 capsule (300 mg total) by mouth 3 (three) times daily.  . glipiZIDE (GLUCOTROL XL) 5 MG 24 hr tablet Take 1 tablet (5 mg total) by mouth daily.  . glucose blood test strip USE TO CHECK ONCE DAILY  . lisinopril (PRINIVIL,ZESTRIL) 10 MG tablet Take 1 tablet (10 mg total) by mouth daily.  . metFORMIN (GLUCOPHAGE) 500 MG tablet Take 2 tablets (1,000 mg total) by mouth 2 (two) times daily.  . Multiple Vitamin (MULTIVITAMIN) tablet Take 1 tablet by mouth daily.  .   omeprazole (PRILOSEC) 20 MG capsule Take 1 capsule (20 mg total) by mouth daily.   No facility-administered encounter medications on file as of 01/25/2017.     Review of Systems  Constitutional: Negative for appetite change and unexpected weight change.  HENT: Negative for congestion and sinus pressure.   Respiratory: Negative for cough, chest tightness and shortness of breath.   Cardiovascular: Negative for chest pain, palpitations and leg swelling.  Gastrointestinal: Negative for abdominal pain, diarrhea, nausea and vomiting.  Genitourinary: Negative for difficulty urinating and dysuria.  Musculoskeletal: Negative for joint swelling.       Leg pain - better.    Skin: Negative for color change and rash.  Neurological: Negative for dizziness, light-headedness and headaches.  Psychiatric/Behavioral: Negative for agitation and dysphoric mood.       Objective:     Blood pressure rechecked by me:   130/72  Physical Exam  Constitutional: She appears well-developed and well-nourished. No distress.  HENT:  Nose: Nose normal.  Mouth/Throat: Oropharynx is clear and moist.  Neck: Neck supple. No thyromegaly present.  Cardiovascular: Normal rate and regular rhythm.  Pulmonary/Chest: Breath sounds normal. No respiratory distress. She has no wheezes.  Abdominal: Soft. Bowel sounds are normal. There is no tenderness.  Musculoskeletal: She exhibits no edema or tenderness.  Lymphadenopathy:    She has no cervical adenopathy.  Skin: No rash noted. No erythema.  Psychiatric: She has a normal mood and affect. Her behavior is normal.    BP 126/68 (BP Location: Left Arm, Patient Position: Sitting, Cuff Size: Normal)   Pulse 66   Temp 97.8 F (36.6 C) (Oral)   Resp 16   Wt 143 lb 12.8 oz (65.2 kg)   SpO2 98%   BMI 26.46 kg/m  Wt Readings from Last 3 Encounters:  01/25/17 143 lb 12.8 oz (65.2 kg)  09/21/16 136 lb 9.6 oz (62 kg)  07/26/16 141 lb 12.8 oz (64.3 kg)     Lab Results  Component Value Date   WBC 8.1 01/23/2017   HGB 11.0 (L) 01/23/2017   HCT 34.9 (L) 01/23/2017   PLT 286.0 01/23/2017   GLUCOSE 108 (H) 01/23/2017   CHOL 192 01/23/2017   TRIG 61.0 01/23/2017   HDL 52.00 01/23/2017   LDLCALC 128 (H) 01/23/2017   ALT 15 01/23/2017   AST 13 01/23/2017   NA 136 01/23/2017   K 4.5 01/23/2017   CL 102 01/23/2017   CREATININE 0.88 01/23/2017   BUN 17 01/23/2017   CO2 27 01/23/2017   TSH 0.57 09/19/2016   HGBA1C 6.8 (H) 01/23/2017   MICROALBUR 1.5 09/21/2016       Assessment & Plan:   Problem List Items Addressed This Visit    Anemia    hgb stable.       Diabetes mellitus ([place])    Low carb diet and exercise.  Follow met b and a1c.       Relevant Medications   atorvastatin (LIPITOR) 10 MG tablet   Hypercholesterolemia    Low cholesterol diet and exercise.  Discussed treatment options.  Start lipitor.  Follow lipid panel and liver function tests.         Relevant Medications   atorvastatin (LIPITOR) 10 MG tablet   Other Relevant Orders   Hepatic function panel   Hypertension    Blood pressure under good control.  Continue same medication regimen.  Follow pressures.  Follow metabolic panel.        Relevant Medications   atorvastatin (LIPITOR)  10 MG tablet    Other Visit Diagnoses    Need for 23-polyvalent pneumococcal polysaccharide vaccine       Relevant Orders   Pneumococcal polysaccharide vaccine 23-valent greater than or equal to 2yo subcutaneous/IM (Completed)       , , MD  

## 2017-01-28 ENCOUNTER — Encounter: Payer: Self-pay | Admitting: Internal Medicine

## 2017-01-28 NOTE — Assessment & Plan Note (Signed)
Low cholesterol diet and exercise.  Discussed treatment options.  Start lipitor.  Follow lipid panel and liver function tests.

## 2017-01-28 NOTE — Assessment & Plan Note (Signed)
hgb stable 

## 2017-01-28 NOTE — Assessment & Plan Note (Signed)
Blood pressure under good control.  Continue same medication regimen.  Follow pressures.  Follow metabolic panel.   

## 2017-01-28 NOTE — Assessment & Plan Note (Signed)
Low carb diet and exercise.  Follow met b and a1c.  

## 2017-03-08 ENCOUNTER — Other Ambulatory Visit (INDEPENDENT_AMBULATORY_CARE_PROVIDER_SITE_OTHER): Payer: Medicare Other

## 2017-03-08 DIAGNOSIS — E7800 Pure hypercholesterolemia, unspecified: Secondary | ICD-10-CM

## 2017-03-08 LAB — HEPATIC FUNCTION PANEL
ALBUMIN: 3.8 g/dL (ref 3.5–5.2)
ALK PHOS: 40 U/L (ref 39–117)
ALT: 15 U/L (ref 0–35)
AST: 15 U/L (ref 0–37)
Bilirubin, Direct: 0.1 mg/dL (ref 0.0–0.3)
TOTAL PROTEIN: 7.4 g/dL (ref 6.0–8.3)
Total Bilirubin: 0.3 mg/dL (ref 0.2–1.2)

## 2017-03-09 ENCOUNTER — Encounter: Payer: Self-pay | Admitting: Internal Medicine

## 2017-03-16 ENCOUNTER — Other Ambulatory Visit: Payer: Self-pay | Admitting: Internal Medicine

## 2017-03-27 ENCOUNTER — Encounter: Payer: Self-pay | Admitting: Internal Medicine

## 2017-03-30 ENCOUNTER — Other Ambulatory Visit: Payer: Self-pay | Admitting: Internal Medicine

## 2017-03-30 DIAGNOSIS — Z1231 Encounter for screening mammogram for malignant neoplasm of breast: Secondary | ICD-10-CM

## 2017-04-03 ENCOUNTER — Telehealth: Payer: Self-pay

## 2017-04-03 NOTE — Telephone Encounter (Signed)
Copied from [place] [phone]. Topic: Appointment Scheduling - Scheduling Inquiry for Clinic >> Apr 03, 2017  9:13 AM Conception Chancy, NT wrote: Patient is calling to reschedule 5/20 physical per mailed letter. First available appt is 07/10/17. Patient does not want to wait that long and states she needs to be seen sooner than that. Please contact pt to schedule.

## 2017-04-04 ENCOUNTER — Telehealth: Payer: Self-pay

## 2017-04-04 NOTE — Telephone Encounter (Signed)
Left voicemail for patient to call office back about rescheduling her appointment.

## 2017-04-04 NOTE — Telephone Encounter (Signed)
Copied from [place] [phone]. Topic: Appointment Scheduling - Scheduling Inquiry for Clinic >> Apr 03, 2017  9:13 AM Conception Chancy, NT wrote: Patient is calling to reschedule 5/20 physical per mailed letter. First available appt is 07/10/17. Patient does not want to wait that long and states she needs to be seen sooner than that. Please contact pt to schedule.   >> Apr 04, 2017 10:40 AM Conception Chancy, NT wrote: Patient is calling to follow up on this.

## 2017-04-04 NOTE — Telephone Encounter (Signed)
Patient is calling to follow up. Please advise.

## 2017-04-09 ENCOUNTER — Other Ambulatory Visit: Payer: Self-pay | Admitting: Internal Medicine

## 2017-04-11 NOTE — Telephone Encounter (Signed)
Patient scheduled for 5/31

## 2017-04-23 ENCOUNTER — Ambulatory Visit
Admission: RE | Admit: 2017-04-23 | Discharge: 2017-04-23 | Disposition: A | Discharge status: Home / Self Care | Payer: Medicare Other | Source: Ambulatory Visit | Attending: Internal Medicine | Admitting: Internal Medicine

## 2017-04-23 DIAGNOSIS — Z1231 Encounter for screening mammogram for malignant neoplasm of breast: Secondary | ICD-10-CM | POA: Diagnosis not present

## 2017-05-02 ENCOUNTER — Other Ambulatory Visit: Payer: Self-pay | Admitting: Internal Medicine

## 2017-05-24 ENCOUNTER — Other Ambulatory Visit: Payer: Medicare Other

## 2017-05-28 ENCOUNTER — Encounter: Payer: Medicare Other | Admitting: Internal Medicine

## 2017-05-31 ENCOUNTER — Other Ambulatory Visit: Payer: Self-pay | Admitting: Internal Medicine

## 2017-06-08 ENCOUNTER — Ambulatory Visit: Payer: Medicare Other | Admitting: Internal Medicine

## 2017-06-08 ENCOUNTER — Encounter: Payer: Self-pay | Admitting: Internal Medicine

## 2017-06-08 VITALS — BP 116/72 | HR 71 | Temp 98.2°F | Resp 18 | Wt 140.6 lb

## 2017-06-08 DIAGNOSIS — R200 Anesthesia of skin: Secondary | ICD-10-CM

## 2017-06-08 DIAGNOSIS — E039 Hypothyroidism, unspecified: Secondary | ICD-10-CM

## 2017-06-08 DIAGNOSIS — E7800 Pure hypercholesterolemia, unspecified: Secondary | ICD-10-CM | POA: Diagnosis not present

## 2017-06-08 DIAGNOSIS — R202 Paresthesia of skin: Secondary | ICD-10-CM | POA: Diagnosis not present

## 2017-06-08 DIAGNOSIS — D649 Anemia, unspecified: Secondary | ICD-10-CM

## 2017-06-08 DIAGNOSIS — E119 Type 2 diabetes mellitus without complications: Secondary | ICD-10-CM

## 2017-06-08 DIAGNOSIS — I10 Essential (primary) hypertension: Secondary | ICD-10-CM

## 2017-06-08 LAB — CBC WITH DIFFERENTIAL/PLATELET
BASOS PCT: 1.2 % (ref 0.0–3.0)
Basophils Absolute: 0.1 10*3/uL (ref 0.0–0.1)
EOS ABS: 0.1 10*3/uL (ref 0.0–0.7)
Eosinophils Relative: 1.6 % (ref 0.0–5.0)
HCT: 34.9 % — ABNORMAL LOW (ref 36.0–46.0)
HEMOGLOBIN: 11.2 g/dL — AB (ref 12.0–15.0)
LYMPHS ABS: 2.2 10*3/uL (ref 0.7–4.0)
Lymphocytes Relative: 31.2 % (ref 12.0–46.0)
MCHC: 32 g/dL (ref 30.0–36.0)
MCV: 78.5 fl (ref 78.0–100.0)
MONO ABS: 0.6 10*3/uL (ref 0.1–1.0)
Monocytes Relative: 8.9 % (ref 3.0–12.0)
NEUTROS PCT: 57.1 % (ref 43.0–77.0)
Neutro Abs: 4.1 10*3/uL (ref 1.4–7.7)
Platelets: 267 10*3/uL (ref 150.0–400.0)
RBC: 4.45 Mil/uL (ref 3.87–5.11)
RDW: 17.1 % — AB (ref 11.5–15.5)
WBC: 7.2 10*3/uL (ref 4.0–10.5)

## 2017-06-08 LAB — LIPID PANEL
CHOLESTEROL: 184 mg/dL (ref 0–200)
HDL: 53.5 mg/dL (ref >39.00)
LDL Cholesterol: 119 mg/dL — ABNORMAL HIGH (ref 0–99)
NONHDL: 130.65
TRIGLYCERIDES: 60 mg/dL (ref 0.0–149.0)
Total CHOL/HDL Ratio: 3
VLDL: 12 mg/dL (ref 0.0–40.0)

## 2017-06-08 LAB — BASIC METABOLIC PANEL
BUN: 21 mg/dL (ref 6–23)
CALCIUM: 9.6 mg/dL (ref 8.4–10.5)
CHLORIDE: 103 meq/L (ref 96–112)
CO2: 23 meq/L (ref 19–32)
CREATININE: 0.82 mg/dL (ref 0.40–1.20)
GFR: 86.23 mL/min (ref >60.00)
GLUCOSE: 65 mg/dL — AB (ref 70–99)
Potassium: 4.6 mEq/L (ref 3.5–5.1)
Sodium: 136 mEq/L (ref 135–145)

## 2017-06-08 LAB — HEMOGLOBIN A1C: HEMOGLOBIN A1C: 6.8 % — AB (ref 4.6–6.5)

## 2017-06-08 LAB — HEPATIC FUNCTION PANEL
ALBUMIN: 4.3 g/dL (ref 3.5–5.2)
ALT: 16 U/L (ref 0–35)
AST: 21 U/L (ref 0–37)
Alkaline Phosphatase: 45 U/L (ref 39–117)
BILIRUBIN TOTAL: 0.4 mg/dL (ref 0.2–1.2)
Bilirubin, Direct: 0.1 mg/dL (ref 0.0–0.3)
Total Protein: 8 g/dL (ref 6.0–8.3)

## 2017-06-08 LAB — FERRITIN: Ferritin: 154.4 ng/mL (ref 10.0–291.0)

## 2017-06-08 NOTE — Progress Notes (Signed)
[name], female   DOB: [birth year], 82 y.o.   MRN: [id number]   Subjective:    [name], female    DOB: 05-08-1935, 82 y.o.   MRN: [id number]  HPI  Patient here for a scheduled follow up.  She is doing relatively well.  Increased stress with her husband's medical issues.  Discussed with her today.  Overall she feels she is handling things relatively well.  Trying to stay active.  No chest pain.  No sob.  No acid reflux.  No abdominal pain.  Bowels moving.  No urine change.  Taking gabapentin.  Leg pain is better.  Off statin medication.  Not able to tolerate.     Past Medical History:  Diagnosis Date  . Anemia   . Diabetes mellitus ([place])   . Hypercholesterolemia   . Hypertension   . Hypothyroidism   . Osteoporosis    Past Surgical History:  Procedure Laterality Date  . CYST EXCISION     low back  . THYROID SURGERY  1992   left hemi-thyroidectomy (colloid goiter0   Family History  Problem Relation Age of Onset  . Emphysema Father   . Heart disease Mother        myocardial infarction (69)  . Hypertension Mother   . Diabetes Mother   . Heart disease Brother        drug use  . Diabetes Mellitus II Sister        x3  . Hypertension Sister   . Breast cancer Unknown        niece  . Breast cancer Sister   . Colon cancer Neg Hx    Social History   Socioeconomic History  . Marital status: Married    Spouse name: Not on file  . Number of children: 2  . Years of education: Not on file  . Highest education level: Not on file  Occupational History  . Not on file  Social Needs  . Financial resource strain: Not on file  . Food insecurity:    Worry: Not on file    Inability: Not on file  . Transportation needs:    Medical: Not on file    Non-medical: Not on file  Tobacco Use  . Smoking status: Never Smoker  . Smokeless tobacco: Never Used  Substance and Sexual Activity  . Alcohol use: No    Alcohol/week: 0.0 oz    Comment:  occasional  . Drug use: No  . Sexual activity: Not on file  Lifestyle  . Physical activity:    Days per week: Not on file    Minutes per session: Not on file  . Stress: Not on file  Relationships  . Social connections:    Talks on phone: Not on file    Gets together: Not on file    Attends religious service: Not on file    Active member of club or organization: Not on file    Attends meetings of clubs or organizations: Not on file    Relationship status: Not on file  Other Topics Concern  . Not on file  Social History Narrative  . Not on file    Outpatient Encounter Medications as of 06/08/2017  Medication Sig  . ACCU-CHEK SOFTCLIX LANCETS lancets CHECK BLOOD SUGAR ONCE A DAY DX E11.9  . aspirin 81 MG tablet Take 81 mg by mouth daily.  . calcium-vitamin D (OSCAL WITH D) 250-125 MG-UNIT tablet Take 1 tablet by mouth daily.  Marland Kitchen  gabapentin (NEURONTIN) 300 MG capsule TAKE 1 CAPSULE BY MOUTH 3 TIMES DAILY  . glipiZIDE (GLUCOTROL XL) 5 MG 24 hr tablet TAKE 1 TABLET DAILY  . glucose blood test strip USE TO CHECK ONCE DAILY  . lisinopril (PRINIVIL,ZESTRIL) 10 MG tablet TAKE 1 TABLET DAILY  . metFORMIN (GLUCOPHAGE) 500 MG tablet TAKE 2 TABLETS TWICE A DAY  . Multiple Vitamin (MULTIVITAMIN) tablet Take 1 tablet by mouth daily.  Marland Kitchen omeprazole (PRILOSEC) 20 MG capsule Take 1 capsule (20 mg total) by mouth daily.  . [DISCONTINUED] albuterol (PROVENTIL HFA) 108 (90 Base) MCG/ACT inhaler Inhale into the lungs.  . [DISCONTINUED] atorvastatin (LIPITOR) 10 MG tablet Take 1 tablet (10 mg total) by mouth daily.   No facility-administered encounter medications on file as of 06/08/2017.     Review of Systems  Constitutional: Negative for appetite change and unexpected weight change.  HENT: Negative for congestion and sinus pressure.   Respiratory: Negative for cough, chest tightness and shortness of breath.   Cardiovascular: Negative for chest pain, palpitations and leg swelling.    Gastrointestinal: Negative for abdominal pain, diarrhea, nausea and vomiting.  Genitourinary: Negative for difficulty urinating and dysuria.  Musculoskeletal: Negative for joint swelling and myalgias.  Skin: Negative for color change and rash.  Neurological: Negative for dizziness, light-headedness and headaches.  Psychiatric/Behavioral: Negative for agitation and dysphoric mood.       Objective:    Physical Exam  Constitutional: She appears well-developed and well-nourished. No distress.  HENT:  Nose: Nose normal.  Mouth/Throat: Oropharynx is clear and moist.  Neck: Neck supple. No thyromegaly present.  Cardiovascular: Normal rate and regular rhythm.  Pulmonary/Chest: Breath sounds normal. No respiratory distress. She has no wheezes.  Abdominal: Soft. Bowel sounds are normal. There is no tenderness.  Musculoskeletal: She exhibits no edema or tenderness.  Lymphadenopathy:    She has no cervical adenopathy.  Skin: No rash noted. No erythema.  Psychiatric: She has a normal mood and affect. Her behavior is normal.    BP 116/72 (BP Location: Left Arm, Patient Position: Sitting, Cuff Size: Normal)   Pulse 71   Temp 98.2 F (36.8 C) (Oral)   Resp 18   Wt 140 lb 9.6 oz (63.8 kg)   SpO2 97%   BMI 25.87 kg/m  Wt Readings from Last 3 Encounters:  06/08/17 140 lb 9.6 oz (63.8 kg)  01/25/17 143 lb 12.8 oz (65.2 kg)  09/21/16 136 lb 9.6 oz (62 kg)     Lab Results  Component Value Date   WBC 7.2 06/08/2017   HGB 11.2 (L) 06/08/2017   HCT 34.9 (L) 06/08/2017   PLT 267.0 06/08/2017   GLUCOSE 65 (L) 06/08/2017   CHOL 184 06/08/2017   TRIG 60.0 06/08/2017   HDL 53.50 06/08/2017   LDLCALC 119 (H) 06/08/2017   ALT 16 06/08/2017   AST 21 06/08/2017   NA 136 06/08/2017   K 4.6 06/08/2017   CL 103 06/08/2017   CREATININE 0.82 06/08/2017   BUN 21 06/08/2017   CO2 23 06/08/2017   TSH 0.57 09/19/2016   HGBA1C 6.8 (H) 06/08/2017   MICROALBUR 1.5 09/21/2016    Mm Screening  Breast Tomo Bilateral  Result Date: 04/23/2017 CLINICAL DATA:  Screening. EXAM: DIGITAL SCREENING BILATERAL MAMMOGRAM WITH TOMO AND CAD COMPARISON:  Previous exam(s). ACR Breast Density Category b: There are scattered areas of fibroglandular density. FINDINGS: There are no findings suspicious for malignancy. Images were processed with CAD. IMPRESSION: No mammographic evidence of malignancy. A result  letter of this screening mammogram will be mailed directly to the patient. RECOMMENDATION: Screening mammogram in one year. (Code:SM-B-01Y) BI-RADS CATEGORY  1: Negative. Electronically Signed   By: Curlene Dolphin M.D.   On: 04/23/2017 16:52       Assessment & Plan:   Problem List Items Addressed This Visit    Anemia    Follow cbc.  hgb has been stable.        Relevant Orders   CBC with Differential/Platelet (Completed)   Ferritin (Completed)   Diabetes mellitus ([place]) - Primary    Low carb diet and exercise.  Follow met b and a1c.        Relevant Orders   Hemoglobin A1c (Completed)   Hypercholesterolemia    Unable to tolerate cholesterol medication.  Low cholesterol diet and exercise.  Follow lipid panel.        Relevant Orders   Hepatic function panel (Completed)   Lipid panel (Completed)   Hypertension    Blood pressure under good control.  Continue same medication regimen.  Follow pressures.  Follow metabolic panel.        Relevant Orders   Basic metabolic panel (Completed)   Hypothyroidism    Check tsh.        Numbness and tingling    W/up by neurology.  Had MRI.  Also NCS - sever bilateral lower extremity polyneuropathy.  On gabapentin.  Stable.            Einar Pheasant, MD

## 2017-06-10 ENCOUNTER — Encounter: Payer: Self-pay | Admitting: Internal Medicine

## 2017-06-10 NOTE — Assessment & Plan Note (Signed)
Low carb diet and exercise.  Follow met b and a1c.

## 2017-06-10 NOTE — Assessment & Plan Note (Signed)
Check tsh 

## 2017-06-10 NOTE — Assessment & Plan Note (Signed)
W/up by neurology.  Had MRI.  Also NCS - sever bilateral lower extremity polyneuropathy.  On gabapentin.  Stable.

## 2017-06-10 NOTE — Assessment & Plan Note (Signed)
Blood pressure under good control.  Continue same medication regimen.  Follow pressures.  Follow metabolic panel.   

## 2017-06-10 NOTE — Assessment & Plan Note (Signed)
Unable to tolerate cholesterol medication.  Low cholesterol diet and exercise.  Follow lipid panel.

## 2017-06-10 NOTE — Assessment & Plan Note (Signed)
Follow cbc.  hgb has been stable.  

## 2017-06-11 ENCOUNTER — Other Ambulatory Visit: Payer: Self-pay | Admitting: Internal Medicine

## 2017-06-11 ENCOUNTER — Other Ambulatory Visit: Payer: Self-pay

## 2017-06-11 ENCOUNTER — Other Ambulatory Visit: Payer: Self-pay | Admitting: *Deleted

## 2017-06-11 ENCOUNTER — Ambulatory Visit (INDEPENDENT_AMBULATORY_CARE_PROVIDER_SITE_OTHER): Payer: Medicare Other

## 2017-06-11 VITALS — BP 106/62 | HR 77 | Temp 98.4°F | Resp 14 | Ht 62.0 in | Wt 140.0 lb

## 2017-06-11 DIAGNOSIS — E7800 Pure hypercholesterolemia, unspecified: Secondary | ICD-10-CM

## 2017-06-11 DIAGNOSIS — Z0000 Encounter for general adult medical examination without abnormal findings: Secondary | ICD-10-CM

## 2017-06-11 DIAGNOSIS — D649 Anemia, unspecified: Secondary | ICD-10-CM

## 2017-06-11 MED ORDER — EZETIMIBE 10 MG PO TABS: 10.0000 mg | ORAL_TABLET | Freq: Every day | ORAL | 0 refills | Status: DC

## 2017-06-11 NOTE — Progress Notes (Addendum)
Subjective:   Wanda Ruiz is a 82 y.o. female who presents for Medicare Annual (Subsequent) preventive examination.  Review of Systems:  No ROS.  Medicare Wellness Visit. Additional risk factors are reflected in the social history.  Cardiac Risk Factors include: advanced age (>75men, >9 women);hypertension;diabetes mellitus     Objective:     Vitals: BP 106/62 (BP Location: Left Arm, Patient Position: Sitting, Cuff Size: Normal)   Pulse 77   Temp 98.4 F (36.9 C) (Oral)   Resp 14   Ht 5\' 2"  (1.575 m)   Wt 140 lb (63.5 kg)   SpO2 98%   BMI 25.61 kg/m   Body mass index is 25.61 kg/m.  Advanced Directives 06/11/2017 06/17/2016 06/17/2016 06/09/2016  Does Patient Have a Medical Advance Directive? No Yes Yes Yes  Type of Advance Directive - - [place];Living will Living will;Healthcare Power of Attorney  Does patient want to make changes to medical advance directive? - - - No - Patient declined  Copy of [place] in Chart? - - - No - copy requested  Would patient like information on creating a medical advance directive? No - Patient declined - - -    Tobacco Social History   Tobacco Use  Smoking Status Never Smoker  Smokeless Tobacco Never Used     Counseling given: Not Answered   Clinical Intake:  Pre-visit preparation completed: Yes  Pain : No/denies pain     Nutritional Status: BMI 25 -29 Overweight Diabetes: Yes(Followed by pcp)  How often do you need to have someone help you when you read instructions, pamphlets, or other written materials from your doctor or pharmacy?: 1 - Never  Interpreter Needed?: No     Past Medical History:  Diagnosis Date  . Anemia   . Diabetes mellitus ([place])   . Hypercholesterolemia   . Hypertension   . Hypothyroidism   . Osteoporosis    Past Surgical History:  Procedure Laterality Date  . CYST EXCISION     low back  . THYROID SURGERY  1992   left hemi-thyroidectomy (colloid  goiter0   Family History  Problem Relation Age of Onset  . Emphysema Father   . Heart disease Mother        myocardial infarction (69)  . Hypertension Mother   . Diabetes Mother   . Heart disease Brother        drug use  . Diabetes Mellitus II Sister        x3  . Hypertension Sister   . Breast cancer Unknown        niece  . Breast cancer Sister   . Cancer Sister   . Colon cancer Neg Hx    Social History   Socioeconomic History  . Marital status: Married    Spouse name: Not on file  . Number of children: 2  . Years of education: Not on file  . Highest education level: Not on file  Occupational History  . Not on file  Social Needs  . Financial resource strain: Not hard at all  . Food insecurity:    Worry: Never true    Inability: Never true  . Transportation needs:    Medical: No    Non-medical: No  Tobacco Use  . Smoking status: Never Smoker  . Smokeless tobacco: Never Used  Substance and Sexual Activity  . Alcohol use: No    Alcohol/week: 0.0 oz    Comment: occasional  . Drug use:  No  . Sexual activity: Not on file  Lifestyle  . Physical activity:    Days per week: Not on file    Minutes per session: Not on file  . Stress: Not on file  Relationships  . Social connections:    Talks on phone: Not on file    Gets together: Not on file    Attends religious service: Not on file    Active member of club or organization: Not on file    Attends meetings of clubs or organizations: Not on file    Relationship status: Not on file  Other Topics Concern  . Not on file  Social History Narrative  . Not on file    Outpatient Encounter Medications as of 06/11/2017  Medication Sig  . ACCU-CHEK SOFTCLIX LANCETS lancets CHECK BLOOD SUGAR ONCE A DAY DX E11.9  . aspirin 81 MG tablet Take 81 mg by mouth daily.  . calcium-vitamin D (OSCAL WITH D) 250-125 MG-UNIT tablet Take 1 tablet by mouth daily.  Marland Kitchen gabapentin (NEURONTIN) 100 MG capsule Take 100 mg by mouth 3 (three)  times daily.  Marland Kitchen glipiZIDE (GLUCOTROL XL) 5 MG 24 hr tablet TAKE 1 TABLET DAILY  . glucose blood test strip USE TO CHECK ONCE DAILY  . lisinopril (PRINIVIL,ZESTRIL) 10 MG tablet TAKE 1 TABLET DAILY  . metFORMIN (GLUCOPHAGE) 500 MG tablet TAKE 2 TABLETS TWICE A DAY  . Multiple Vitamin (MULTIVITAMIN) tablet Take 1 tablet by mouth daily.  Marland Kitchen omeprazole (PRILOSEC) 20 MG capsule Take 1 capsule (20 mg total) by mouth daily.  Marland Kitchen gabapentin (NEURONTIN) 300 MG capsule TAKE 1 CAPSULE BY MOUTH 3 TIMES DAILY (Patient not taking: Reported on 06/11/2017)   No facility-administered encounter medications on file as of 06/11/2017.     Activities of Daily Living In your present state of health, do you have any difficulty performing the following activities: 06/11/2017  Hearing? N  Vision? N  Difficulty concentrating or making decisions? N  Walking or climbing stairs? N  Dressing or bathing? N  Doing errands, shopping? N  Preparing Food and eating ? N  Using the Toilet? N  In the past six months, have you accidently leaked urine? N  Do you have problems with loss of bowel control? N  Managing your Medications? N  Managing your Finances? N  Housekeeping or managing your Housekeeping? N  Some recent data might be hidden    Patient Care Team: Einar Pheasant, MD as PCP - General (Internal Medicine)    Assessment:   This is a routine wellness examination for Wanda Ruiz.  The goal of the wellness visit is to assist the patient how to close the gaps in care and create a preventative care plan for the patient.   The roster of all physicians providing medical care to patient is listed in the Snapshot section of the chart.  Taking calcium VIT D as appropriate/Osteoporosis risk reviewed.    Safety issues reviewed; Smoke and carbon monoxide detectors in the home. No firearms in the home. Wears seatbelts when driving or riding with others. No violence in the home.  They do not have excessive sun exposure.   Discussed the need for sun protection: hats, long sleeves and the use of sunscreen if there is significant sun exposure.  Patient is alert, normal appearance, oriented to person/place/and time.  Correctly identified the president of the [place] and recalls of 5/5 words. Performs simple calculations and can read correct time from watch face.  Displays appropriate judgement.  No new identified risk were noted.  No failures at ADL's or IADL's.  BMI- discussed the importance of a healthy diet, water intake and the benefits of aerobic exercise. Educational material provided.   24 hour diet recall: Regular diet   Dental- UTD  Eye- Visual acuity not assessed per patient preference since they have regular follow up with the ophthalmologist.  Wears corrective lenses.  Sleep patterns- Sleeps without issues.   TDAP vaccine deferred per patient preference.  Follow up with insurance.  Educational material provided.  Patient Concerns: None at this time. Follow up with PCP as needed.  Exercise Activities and Dietary recommendations Current Exercise Habits: Home exercise routine, Type of exercise: walking, Time (Minutes): 20, Frequency (Times/Week): 4, Weekly Exercise (Minutes/Week): 80, Intensity: Mild  Goals    . Increase physical activity     Use stationary bike and treadmill for exercise       Fall Risk Fall Risk  06/11/2017 06/21/2016 06/09/2016 10/22/2015 02/18/2015  Falls in the past year? No No No No No   Depression Screen PHQ 2/9 Scores 06/11/2017 06/21/2016 06/09/2016 10/22/2015  PHQ - 2 Score 0 0 0 0  PHQ- 9 Score - - 0 -     Cognitive Function MMSE - Mini Mental State Exam 06/09/2016  Orientation to time 5  Orientation to Place 5  Registration 3  Attention/ Calculation 5  Recall 3  Language- name 2 objects 2  Language- repeat 1  Language- follow 3 step command 3  Language- read & follow direction 1  Write a sentence 1  Copy design 1  Total score 30        Immunization  History  Administered Date(s) Administered  . Influenza, High Dose Seasonal PF 10/22/2015, 09/19/2016  . Influenza,inj,Quad PF,6+ Mos 10/13/2013, 10/15/2014  . Pneumococcal Conjugate-13 02/13/2014  . Pneumococcal Polysaccharide-23 01/25/2017    Screening Tests Health Maintenance  Topic Date Due  . TETANUS/TDAP  04/06/1954  . INFLUENZA VACCINE  08/09/2017  . FOOT EXAM  09/21/2017  . HEMOGLOBIN A1C  12/08/2017  . OPHTHALMOLOGY EXAM  01/11/2018  . MAMMOGRAM  04/24/2018  . DEXA SCAN  Completed  . PNA vac Low Risk Adult  Completed      Plan:   End of life planning; Advanced aging; Advanced directives discussed.  No HCPOA/Living Will.  Additional information declined at this time.  I have personally reviewed and noted the following in the patient's chart:   . Medical and social history . Use of alcohol, tobacco or illicit drugs  . Current medications and supplements . Functional ability and status . Nutritional status . Physical activity . Advanced directives . List of other physicians . Hospitalizations, surgeries, and ER visits in previous 12 months . Vitals . Screenings to include cognitive, depression, and falls . Referrals and appointments  In addition, I have reviewed and discussed with patient certain preventive protocols, quality metrics, and best practice recommendations. A written personalized care plan for preventive services as well as general preventive health recommendations were provided to patient.     Varney Biles, LPN  0/03/5007   Reviewed above information.  Agree with assessment and plan.    Dr Nicki Reaper

## 2017-06-11 NOTE — Progress Notes (Signed)
Orders placed for f/u labs.  

## 2017-06-11 NOTE — Patient Instructions (Addendum)
  Ms. Inch , Thank you for taking time to come for your Medicare Wellness Visit. I appreciate your ongoing commitment to your health goals. Please review the following plan we discussed and let me know if I can assist you in the future.   Follow up as needed.    Have a great day!  These are the goals we discussed: Goals    . Increase physical activity     Use stationary bike and treadmill for exercise       This is a list of the screening recommended for you and due dates:  Health Maintenance  Topic Date Due  . Tetanus Vaccine  04/06/1956  . Flu Shot  08/09/2017  . Complete foot exam   09/21/2017  . Hemoglobin A1C  12/08/2017  . Eye exam for diabetics  01/11/2018  . Mammogram  04/24/2018  . DEXA scan (bone density measurement)  Completed  . Pneumonia vaccines  Completed

## 2017-06-12 ENCOUNTER — Telehealth: Payer: Self-pay | Admitting: *Deleted

## 2017-06-12 NOTE — Telephone Encounter (Signed)
Called patient back and she states that she has a new bottle of Gabapentin 300 mg with the directions to take 1 tablet 3 times daily. She also states that's what the old bottle said as well and she states that didn't notice the bottle said 300mg  she thought she was taking 100mg  because that's what the doctor told her to take. So now she is not sure how much she is suppose to take but she has definitely been taking 300mg  three times daily.

## 2017-06-12 NOTE — Telephone Encounter (Signed)
Left message for patient to call office need to verify gabapentin dosage on hand and how patient is taking gabapentin. Once daily , 3 time daily? [facility] nurse may advise.

## 2017-06-12 NOTE — Telephone Encounter (Signed)
If she has been taking 300mg  gabapentin, then will continue 300mg .  Please confirm with her again how often she is taking.  Thanks

## 2017-06-12 NOTE — Telephone Encounter (Signed)
-----   Message from [name], LPN sent at 0/09/7351  1:50 PM EDT ----- Regarding: medication Can you fix this person's Rx for gabapentin to read 100mg  instead of 300mg  for the gabapentin?  She is taking 300mg  daily (which is correct) with each pill at 100mg . Not each pill at 300mg .   I cannot seem to do it without her losing the 2 refills she has on file.  Or send to [place] if you cannot.  Thank you.  [name]

## 2017-06-12 NOTE — Telephone Encounter (Signed)
Patient said she definitely has been taking 300 mg daily of gabapentin is this the correct dose you would like.

## 2017-06-12 NOTE — Telephone Encounter (Signed)
Pt returned call. Please call back [phone]

## 2017-06-13 NOTE — Telephone Encounter (Signed)
She say 3 times per day as the bottle says.

## 2017-06-13 NOTE — Telephone Encounter (Signed)
Ok to refill if needing and she has been taking

## 2017-06-14 MED ORDER — GABAPENTIN 300 MG PO CAPS: 300.0000 mg | ORAL_CAPSULE | Freq: Three times a day (TID) | ORAL | 2 refills | Status: DC

## 2017-06-14 NOTE — Telephone Encounter (Signed)
Refilled Gabapentin. Sent to [facility]

## 2017-06-22 ENCOUNTER — Other Ambulatory Visit: Payer: Self-pay | Admitting: Internal Medicine

## 2017-07-02 NOTE — Telephone Encounter (Signed)
Pt is still questioning the dose of her gabapentin (NEURONTIN) 300 MG capsule pt is not taking them at all due to the dose. Please advise? Thank you!  Call pt @ [phone].

## 2017-07-03 NOTE — Telephone Encounter (Signed)
Clarified with pt to take 300mg  once a day as she has been doing.  Will call me if feels needs to change.

## 2017-07-03 NOTE — Telephone Encounter (Signed)
Patient has been taking 300 mg once daily and it seems to help her. Advised patient to continue taking how she is taking if it is working. Patient stated she would do so and discuss further at her next visit. Advised I would call her back if she needs to take differently

## 2017-07-23 ENCOUNTER — Other Ambulatory Visit (INDEPENDENT_AMBULATORY_CARE_PROVIDER_SITE_OTHER): Payer: Medicare Other

## 2017-07-23 DIAGNOSIS — E7800 Pure hypercholesterolemia, unspecified: Secondary | ICD-10-CM | POA: Diagnosis not present

## 2017-07-23 DIAGNOSIS — D649 Anemia, unspecified: Secondary | ICD-10-CM

## 2017-07-23 LAB — HEPATIC FUNCTION PANEL
ALBUMIN: 4.2 g/dL (ref 3.5–5.2)
ALT: 13 U/L (ref 0–35)
AST: 13 U/L (ref 0–37)
Alkaline Phosphatase: 42 U/L (ref 39–117)
Bilirubin, Direct: 0.1 mg/dL (ref 0.0–0.3)
TOTAL PROTEIN: 7.4 g/dL (ref 6.0–8.3)
Total Bilirubin: 0.3 mg/dL (ref 0.2–1.2)

## 2017-07-23 LAB — CBC WITH DIFFERENTIAL/PLATELET
BASOS ABS: 0.1 10*3/uL (ref 0.0–0.1)
BASOS PCT: 1.4 % (ref 0.0–3.0)
EOS ABS: 0.1 10*3/uL (ref 0.0–0.7)
Eosinophils Relative: 1.6 % (ref 0.0–5.0)
HCT: 34.6 % — ABNORMAL LOW (ref 36.0–46.0)
HEMOGLOBIN: 11.1 g/dL — AB (ref 12.0–15.0)
LYMPHS PCT: 22.3 % (ref 12.0–46.0)
Lymphs Abs: 1.9 10*3/uL (ref 0.7–4.0)
MCHC: 32.1 g/dL (ref 30.0–36.0)
MCV: 78.3 fl (ref 78.0–100.0)
MONO ABS: 0.7 10*3/uL (ref 0.1–1.0)
Monocytes Relative: 7.8 % (ref 3.0–12.0)
Neutro Abs: 5.6 10*3/uL (ref 1.4–7.7)
Neutrophils Relative %: 66.9 % (ref 43.0–77.0)
Platelets: 291 10*3/uL (ref 150.0–400.0)
RBC: 4.42 Mil/uL (ref 3.87–5.11)
RDW: 16.5 % — AB (ref 11.5–15.5)
WBC: 8.4 10*3/uL (ref 4.0–10.5)

## 2017-07-23 LAB — IBC PANEL
IRON: 45 ug/dL (ref 42–145)
Saturation Ratios: 13.6 % — ABNORMAL LOW (ref 20.0–50.0)
TRANSFERRIN: 237 mg/dL (ref 212.0–360.0)

## 2017-07-23 LAB — FERRITIN: FERRITIN: 134.8 ng/mL (ref 10.0–291.0)

## 2017-07-26 ENCOUNTER — Other Ambulatory Visit: Payer: Self-pay | Admitting: Internal Medicine

## 2017-07-26 DIAGNOSIS — D649 Anemia, unspecified: Secondary | ICD-10-CM

## 2017-07-26 NOTE — Progress Notes (Signed)
Order placed for GI referral.   

## 2017-08-07 ENCOUNTER — Other Ambulatory Visit: Payer: Self-pay | Admitting: Internal Medicine

## 2017-08-10 ENCOUNTER — Encounter: Payer: Self-pay | Admitting: Internal Medicine

## 2017-08-10 ENCOUNTER — Ambulatory Visit (INDEPENDENT_AMBULATORY_CARE_PROVIDER_SITE_OTHER): Payer: Medicare Other | Admitting: Internal Medicine

## 2017-08-10 VITALS — BP 130/70 | HR 70 | Temp 98.3°F | Resp 17 | Ht 62.0 in | Wt 140.2 lb

## 2017-08-10 DIAGNOSIS — E119 Type 2 diabetes mellitus without complications: Secondary | ICD-10-CM

## 2017-08-10 DIAGNOSIS — I10 Essential (primary) hypertension: Secondary | ICD-10-CM | POA: Diagnosis not present

## 2017-08-10 DIAGNOSIS — Z0000 Encounter for general adult medical examination without abnormal findings: Secondary | ICD-10-CM

## 2017-08-10 DIAGNOSIS — E7800 Pure hypercholesterolemia, unspecified: Secondary | ICD-10-CM | POA: Diagnosis not present

## 2017-08-10 DIAGNOSIS — M8580 Other specified disorders of bone density and structure, unspecified site: Secondary | ICD-10-CM

## 2017-08-10 DIAGNOSIS — D649 Anemia, unspecified: Secondary | ICD-10-CM | POA: Diagnosis not present

## 2017-08-10 DIAGNOSIS — R200 Anesthesia of skin: Secondary | ICD-10-CM

## 2017-08-10 DIAGNOSIS — R202 Paresthesia of skin: Secondary | ICD-10-CM

## 2017-08-10 NOTE — Progress Notes (Signed)
[name], female   DOB: 1935-02-19, 82 y.o.   MRN: [id number]   Subjective:    [name], female    DOB: 08-30-1935, 82 y.o.   MRN: [id number]  HPI  Patient here for her physical exam.  She was just evaluated by GI for iron deficient anemia.  Planning for colonoscopy and EGD.  Was given hemoccults.  She reports that she is doing relatively well overall.  No chest pain.  No sob.  Tries to stay active.  No acid reflux.  No abdominal pain.  Bowels moving.  On gabapentin. There was a mix up in her dose.  She is now taking 380m one per day.  Still with increased burning sensation in her feet.  Has taken gabapentin bid and tolerated.  Discussed trying this dose and seeing if helps.  Tolerates.     Past Medical History:  Diagnosis Date  . Anemia   . Diabetes mellitus ([place]   . Hypercholesterolemia   . Hypertension   . Hypothyroidism   . Osteoporosis    Past Surgical History:  Procedure Laterality Date  . CYST EXCISION     low back  . THYROID SURGERY  1992   left hemi-thyroidectomy (colloid goiter0   Family History  Problem Relation Age of Onset  . Emphysema Father   . Heart disease Mother        myocardial infarction (69)  . Hypertension Mother   . Diabetes Mother   . Heart disease Brother        drug use  . Diabetes Mellitus II Sister        x3  . Hypertension Sister   . Breast cancer Unknown        niece  . Breast cancer Sister   . Cancer Sister   . Colon cancer Neg Hx    Social History   Socioeconomic History  . Marital status: Married    Spouse name: Not on file  . Number of children: 2  . Years of education: Not on file  . Highest education level: Not on file  Occupational History  . Not on file  Social Needs  . Financial resource strain: Not hard at all  . Food insecurity:    Worry: Never true    Inability: Never true  . Transportation needs:    Medical: No    Non-medical: No  Tobacco Use  . Smoking status: Never Smoker   . Smokeless tobacco: Never Used  Substance and Sexual Activity  . Alcohol use: No    Alcohol/week: 0.0 oz    Comment: occasional  . Drug use: No  . Sexual activity: Not on file  Lifestyle  . Physical activity:    Days per week: Not on file    Minutes per session: Not on file  . Stress: Not on file  Relationships  . Social connections:    Talks on phone: Not on file    Gets together: Not on file    Attends religious service: Not on file    Active member of club or organization: Not on file    Attends meetings of clubs or organizations: Not on file    Relationship status: Not on file  Other Topics Concern  . Not on file  Social History Narrative  . Not on file    Outpatient Encounter Medications as of 08/10/2017  Medication Sig  . ACCU-CHEK AVIVA PLUS test strip CHECK BLOOD SUGAR TWICE A DAY  . ACCU-CHEK  SOFTCLIX LANCETS lancets CHECK BLOOD SUGAR ONCE A DAY DX E11.9  . aspirin 81 MG tablet Take 81 mg by mouth daily.  . calcium-vitamin D (OSCAL WITH D) 250-125 MG-UNIT tablet Take 1 tablet by mouth daily.  Marland Kitchen ezetimibe (ZETIA) 10 MG tablet Take 1 tablet (10 mg total) by mouth daily.  Marland Kitchen glipiZIDE (GLUCOTROL XL) 5 MG 24 hr tablet TAKE 1 TABLET DAILY  . lisinopril (PRINIVIL,ZESTRIL) 10 MG tablet TAKE 1 TABLET DAILY  . metFORMIN (GLUCOPHAGE) 500 MG tablet TAKE 2 TABLETS TWICE A DAY  . Multiple Vitamin (MULTIVITAMIN) tablet Take 1 tablet by mouth daily.  Marland Kitchen omeprazole (PRILOSEC) 20 MG capsule TAKE 1 CAPSULE DAILY  . [DISCONTINUED] gabapentin (NEURONTIN) 300 MG capsule Take 1 capsule (300 mg total) by mouth 3 (three) times daily.   No facility-administered encounter medications on file as of 08/10/2017.     Review of Systems  Constitutional: Negative for appetite change and unexpected weight change.  HENT: Negative for congestion and sinus pressure.   Eyes: Negative for pain and visual disturbance.  Respiratory: Negative for cough, chest tightness and shortness of breath.     Cardiovascular: Negative for chest pain, palpitations and leg swelling.  Gastrointestinal: Negative for abdominal pain, diarrhea and nausea.  Genitourinary: Negative for difficulty urinating and dysuria.  Musculoskeletal: Negative for joint swelling and myalgias.  Skin: Negative for color change and rash.  Neurological: Negative for dizziness, light-headedness and headaches.       Burning in her feet as outlined.    Hematological: Negative for adenopathy. Does not bruise/bleed easily.  Psychiatric/Behavioral: Negative for agitation and dysphoric mood.       Objective:    Physical Exam  Constitutional: She is oriented to person, place, and time. She appears well-developed and well-nourished. No distress.  HENT:  Nose: Nose normal.  Mouth/Throat: Oropharynx is clear and moist.  Eyes: Right eye exhibits no discharge. Left eye exhibits no discharge. No scleral icterus.  Neck: Neck supple. No thyromegaly present.  Cardiovascular: Normal rate and regular rhythm.  Pulmonary/Chest: Breath sounds normal. No accessory muscle usage. No tachypnea. No respiratory distress. She has no decreased breath sounds. She has no wheezes. She has no rhonchi. Right breast exhibits no inverted nipple, no mass, no nipple discharge and no tenderness (no axillary adenopathy). Left breast exhibits no inverted nipple, no mass, no nipple discharge and no tenderness (no axilarry adenopathy).  Abdominal: Soft. Bowel sounds are normal. There is no tenderness.  Musculoskeletal: She exhibits no edema or tenderness.  Lymphadenopathy:    She has no cervical adenopathy.  Neurological: She is alert and oriented to person, place, and time.  Skin: No rash noted. No erythema.  Psychiatric: She has a normal mood and affect. Her behavior is normal.    BP 130/70   Pulse 70   Temp 98.3 F (36.8 C) (Oral)   Resp 17   Ht 5' 2"  (1.575 m)   Wt 140 lb 4 oz (63.6 kg)   SpO2 97%   BMI 25.65 kg/m  Wt Readings from Last 3  Encounters:  08/10/17 140 lb 4 oz (63.6 kg)  06/11/17 140 lb (63.5 kg)  06/08/17 140 lb 9.6 oz (63.8 kg)     Lab Results  Component Value Date   WBC 8.4 07/23/2017   HGB 11.1 (L) 07/23/2017   HCT 34.6 (L) 07/23/2017   PLT 291.0 07/23/2017   GLUCOSE 65 (L) 06/08/2017   CHOL 184 06/08/2017   TRIG 60.0 06/08/2017   HDL 53.50 06/08/2017  LDLCALC 119 (H) 06/08/2017   ALT 13 07/23/2017   AST 13 07/23/2017   NA 136 06/08/2017   K 4.6 06/08/2017   CL 103 06/08/2017   CREATININE 0.82 06/08/2017   BUN 21 06/08/2017   CO2 23 06/08/2017   TSH 0.57 09/19/2016   HGBA1C 6.8 (H) 06/08/2017   MICROALBUR 1.5 09/21/2016    Mm Screening Breast Tomo Bilateral  Result Date: 04/23/2017 CLINICAL DATA:  Screening. EXAM: DIGITAL SCREENING BILATERAL MAMMOGRAM WITH TOMO AND CAD COMPARISON:  Previous exam(s). ACR Breast Density Category b: There are scattered areas of fibroglandular density. FINDINGS: There are no findings suspicious for malignancy. Images were processed with CAD. IMPRESSION: No mammographic evidence of malignancy. A result letter of this screening mammogram will be mailed directly to the patient. RECOMMENDATION: Screening mammogram in one year. (Code:SM-B-01Y) BI-RADS CATEGORY  1: Negative. Electronically Signed   By: Curlene Dolphin M.D.   On: 04/23/2017 16:52       Assessment & Plan:   Problem List Items Addressed This Visit    Anemia    Just evaluated by GI for iron deficient anemia.  Planning for EGD and colonoscopy.  Stool cards given.        Relevant Orders   CBC with Differential/Platelet   Ferritin   Diabetes mellitus ([place])    Low carb diet and exercise.  Follow met b and a1c.   Lab Results  Component Value Date   HGBA1C 6.8 (H) 06/08/2017        Relevant Orders   Hemoglobin Q3R   Basic metabolic panel   Microalbumin / creatinine urine ratio   Health care maintenance    Physical today 08/10/17.  Colonoscopy 04/22/12.  Just saw GI.  Planning for f/u colonoscopy.   Mammogram 04/23/17 - Birads I. Discussed bone density.  Also discussed shingrix.        Hypercholesterolemia    She has been unable to tolerate cholesterol medication.  Low cholesterol diet and exercise.  Follow lipid panel.        Relevant Orders   Hepatic function panel   Lipid panel   Hypertension    Blood pressure under good control.  Continue same medication regimen.  Follow pressures.  Follow metabolic panel.        Relevant Orders   TSH   Numbness and tingling    Has been worked up by neurology.  Had MRI.  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  On gabapentin.  Will try gabapentin 319m bid and see how tolerates.  Will call when needs refill confirm dose needed.        Osteopenia    Off fosamax.  Discussed f/u bone density.  Will notify me when agreeable.        Relevant Orders   VITAMIN D 25 Hydroxy (Vit-D Deficiency, Fractures)    Other Visit Diagnoses    Routine general medical examination at a health care facility    -  Primary       SEinar Pheasant MD

## 2017-08-10 NOTE — Assessment & Plan Note (Addendum)
Physical today 08/10/17.  Colonoscopy 04/22/12.  Just saw GI.  Planning for f/u colonoscopy.  Mammogram 04/23/17 - Birads I. Discussed bone density.  Also discussed shingrix.

## 2017-08-12 ENCOUNTER — Encounter: Payer: Self-pay | Admitting: Internal Medicine

## 2017-08-12 NOTE — Assessment & Plan Note (Signed)
Low carb diet and exercise.  Follow met b and a1c.   Lab Results  Component Value Date   HGBA1C 6.8 (H) 06/08/2017

## 2017-08-12 NOTE — Assessment & Plan Note (Signed)
Just evaluated by GI for iron deficient anemia.  Planning for EGD and colonoscopy.  Stool cards given.

## 2017-08-12 NOTE — Assessment & Plan Note (Signed)
Blood pressure under good control.  Continue same medication regimen.  Follow pressures.  Follow metabolic panel.   

## 2017-08-12 NOTE — Assessment & Plan Note (Signed)
She has been unable to tolerate cholesterol medication.  Low cholesterol diet and exercise.  Follow lipid panel.

## 2017-08-12 NOTE — Assessment & Plan Note (Signed)
Has been worked up by neurology.  Had MRI.  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  On gabapentin.  Will try gabapentin 300mg  bid and see how tolerates.  Will call when needs refill confirm dose needed.

## 2017-08-12 NOTE — Assessment & Plan Note (Signed)
Off fosamax.  Discussed f/u bone density.  Will notify me when agreeable.

## 2017-09-06 ENCOUNTER — Other Ambulatory Visit: Payer: Self-pay | Admitting: Internal Medicine

## 2017-11-07 LAB — HM COLONOSCOPY

## 2017-11-08 ENCOUNTER — Encounter: Payer: Self-pay | Admitting: *Deleted

## 2017-11-09 ENCOUNTER — Encounter
Admission: RE | Disposition: A | Discharge status: Home / Self Care | Payer: Self-pay | Source: Ambulatory Visit | Attending: Gastroenterology

## 2017-11-09 ENCOUNTER — Encounter: Payer: Self-pay | Admitting: *Deleted

## 2017-11-09 ENCOUNTER — Ambulatory Visit
Admission: RE | Admit: 2017-11-09 | Discharge: 2017-11-09 | Disposition: A | Discharge status: Home / Self Care | Payer: Medicare Other | Source: Ambulatory Visit | Attending: Gastroenterology | Admitting: Gastroenterology

## 2017-11-09 ENCOUNTER — Ambulatory Visit: Payer: Medicare Other | Admitting: Anesthesiology

## 2017-11-09 DIAGNOSIS — Z7982 Long term (current) use of aspirin: Secondary | ICD-10-CM | POA: Diagnosis not present

## 2017-11-09 DIAGNOSIS — K219 Gastro-esophageal reflux disease without esophagitis: Secondary | ICD-10-CM | POA: Diagnosis not present

## 2017-11-09 DIAGNOSIS — D175 Benign lipomatous neoplasm of intra-abdominal organs: Secondary | ICD-10-CM | POA: Diagnosis not present

## 2017-11-09 DIAGNOSIS — K2970 Gastritis, unspecified, without bleeding: Secondary | ICD-10-CM | POA: Insufficient documentation

## 2017-11-09 DIAGNOSIS — E039 Hypothyroidism, unspecified: Secondary | ICD-10-CM | POA: Insufficient documentation

## 2017-11-09 DIAGNOSIS — E7800 Pure hypercholesterolemia, unspecified: Secondary | ICD-10-CM | POA: Insufficient documentation

## 2017-11-09 DIAGNOSIS — I10 Essential (primary) hypertension: Secondary | ICD-10-CM | POA: Diagnosis not present

## 2017-11-09 DIAGNOSIS — D509 Iron deficiency anemia, unspecified: Secondary | ICD-10-CM | POA: Diagnosis present

## 2017-11-09 DIAGNOSIS — K640 First degree hemorrhoids: Secondary | ICD-10-CM | POA: Diagnosis not present

## 2017-11-09 DIAGNOSIS — K228 Other specified diseases of esophagus: Secondary | ICD-10-CM | POA: Diagnosis not present

## 2017-11-09 DIAGNOSIS — K5520 Angiodysplasia of colon without hemorrhage: Secondary | ICD-10-CM | POA: Diagnosis not present

## 2017-11-09 DIAGNOSIS — Z7984 Long term (current) use of oral hypoglycemic drugs: Secondary | ICD-10-CM | POA: Insufficient documentation

## 2017-11-09 DIAGNOSIS — E119 Type 2 diabetes mellitus without complications: Secondary | ICD-10-CM | POA: Diagnosis not present

## 2017-11-09 DIAGNOSIS — K3189 Other diseases of stomach and duodenum: Secondary | ICD-10-CM | POA: Diagnosis not present

## 2017-11-09 HISTORY — PX: ESOPHAGOGASTRODUODENOSCOPY (EGD) WITH PROPOFOL: SHX5813

## 2017-11-09 HISTORY — DX: Gastro-esophageal reflux disease without esophagitis: K21.9

## 2017-11-09 HISTORY — PX: COLONOSCOPY WITH PROPOFOL: SHX5780

## 2017-11-09 SURGERY — COLONOSCOPY WITH PROPOFOL
Anesthesia: General

## 2017-11-09 MED ORDER — FENTANYL CITRATE (PF) 100 MCG/2ML IJ SOLN
INTRAMUSCULAR | Status: AC
  Filled 2017-11-09: qty 2

## 2017-11-09 MED ORDER — PROPOFOL 10 MG/ML IV BOLUS
INTRAVENOUS | Status: AC
  Filled 2017-11-09: qty 40

## 2017-11-09 MED ORDER — PROPOFOL 500 MG/50ML IV EMUL
INTRAVENOUS | Status: DC | PRN
  Administered 2017-11-09: 100 ug/kg/min via INTRAVENOUS

## 2017-11-09 MED ORDER — PROPOFOL 10 MG/ML IV BOLUS
INTRAVENOUS | Status: DC | PRN
  Administered 2017-11-09: 70 mg via INTRAVENOUS

## 2017-11-09 MED ORDER — FENTANYL CITRATE (PF) 100 MCG/2ML IJ SOLN
INTRAMUSCULAR | Status: DC | PRN
  Administered 2017-11-09: 25 ug via INTRAVENOUS
  Administered 2017-11-09: 50 ug via INTRAVENOUS
  Administered 2017-11-09: 25 ug via INTRAVENOUS

## 2017-11-09 MED ORDER — PHENYLEPHRINE HCL 10 MG/ML IJ SOLN
INTRAMUSCULAR | Status: DC | PRN
  Administered 2017-11-09 (×2): 200 ug via INTRAVENOUS

## 2017-11-09 MED ORDER — SODIUM CHLORIDE 0.9 % IV SOLN
INTRAVENOUS | Status: DC
  Administered 2017-11-09: 1000 mL via INTRAVENOUS

## 2017-11-09 MED ORDER — LIDOCAINE HCL (CARDIAC) PF 100 MG/5ML IV SOSY
PREFILLED_SYRINGE | INTRAVENOUS | Status: DC | PRN
  Administered 2017-11-09: 80 mg via INTRAVENOUS

## 2017-11-09 NOTE — Transfer of Care (Signed)
Immediate Anesthesia Transfer of Care Note  Patient: Porchea Enge  Procedure(s) Performed: COLONOSCOPY WITH PROPOFOL (N/A ) ESOPHAGOGASTRODUODENOSCOPY (EGD) WITH PROPOFOL (N/A )  Patient Location: PACU  Anesthesia Type:General  Level of Consciousness: sedated  Airway & Oxygen Therapy: Patient Spontanous Breathing and Patient connected to nasal cannula oxygen  Post-op Assessment: Report given to RN and Post -op Vital signs reviewed and stable  Post vital signs: Reviewed  Last Vitals:  Vitals Value Taken Time  BP 88/54 11/09/2017  8:33 AM  Temp 36.1 C 11/09/2017  8:29 AM  Pulse 69 11/09/2017  8:34 AM  Resp 12 11/09/2017  8:34 AM  SpO2 100 % 11/09/2017  8:34 AM  Vitals shown include unvalidated device data.  Last Pain:  Vitals:   11/09/17 0829  TempSrc: Tympanic  PainSc:          Complications: No apparent anesthesia complications

## 2017-11-09 NOTE — Anesthesia Preprocedure Evaluation (Signed)
Anesthesia Evaluation  Patient identified by MRN, date of birth, ID band Patient awake    Reviewed: Allergy & Precautions, NPO status , Patient's Chart, lab work & pertinent test results  History of Anesthesia Complications Negative for: history of anesthetic complications  Airway Mallampati: III       Dental   Pulmonary neg COPD,           Cardiovascular hypertension, Pt. on medications (-) Past MI and (-) CHF (-) dysrhythmias (-) Valvular Problems/Murmurs     Neuro/Psych neg Seizures    GI/Hepatic Neg liver ROS, GERD  Medicated,  Endo/Other  diabetes, Type 2, Oral Hypoglycemic AgentsHypothyroidism (s/p partial thyroidectomy, off meds now)   Renal/GU negative Renal ROS     Musculoskeletal   Abdominal   Peds  Hematology  (+) anemia ,   Anesthesia Other Findings   Reproductive/Obstetrics                             Anesthesia Physical Anesthesia Plan  ASA: III  Anesthesia Plan: General   Post-op Pain Management:    Induction:   PONV Risk Score and Plan: 3 and Propofol infusion, TIVA and Treatment may vary due to age or medical condition  Airway Management Planned: Nasal Cannula  Additional Equipment:   Intra-op Plan:   Post-operative Plan:   Informed Consent: I have reviewed the patients History and Physical, chart, labs and discussed the procedure including the risks, benefits and alternatives for the proposed anesthesia with the patient or authorized representative who has indicated his/her understanding and acceptance.     Plan Discussed with:   Anesthesia Plan Comments:         Anesthesia Quick Evaluation

## 2017-11-09 NOTE — Op Note (Signed)
[facility] Gastroenterology Patient Name: Wanda Ruiz Procedure Date: 11/09/2017 7:24 AM MRN: [id number] Account #: [id number] Date of Birth: 01-07-1936 Admit Type: Outpatient Age: 82 Room: [facility] ENDO ROOM 3 Gender: Female Note Status: Finalized Procedure:            Colonoscopy Indications:          Iron deficiency anemia Providers:            Lollie Sails, MD Medicines:            Monitored Anesthesia Care Complications:        No immediate complications. Procedure:            Pre-Anesthesia Assessment:                       - ASA Grade Assessment: III - A patient with severe                        systemic disease.                       After obtaining informed consent, the colonoscope was                        passed under direct vision. Throughout the procedure,                        the patient's blood pressure, pulse, and oxygen                        saturations were monitored continuously. The                        Colonoscope was introduced through the anus and                        advanced to the the cecum, identified by appendiceal                        orifice and ileocecal valve. The colonoscopy was                        performed with moderate difficulty due to poor bowel                        prep and a redundant colon. Successful completion of                        the procedure was aided by changing the patient to a                        supine position, changing the patient to a prone                        position, using manual pressure and lavage. The patient                        tolerated the procedure well. The quality of the bowel                        preparation was fair.  Findings:      The descending colon and transverse colon were significantly tortuous.      Non-bleeding internal hemorrhoids were found during anoscopy. The       hemorrhoids were small and Grade I (internal hemorrhoids that do not   prolapse).      There was a medium-sized lipoma, positive pillow sign, in the proximal       transverse colon.      A single small localized angiodysplastic lesion without bleeding was       found at the ileocecal valve. Deep to the mucosa.      The retroflexed view of the distal rectum and anal verge was normal and       showed no anal or rectal abnormalities. Impression:           - Preparation of the colon was fair.                       - Tortuous colon.                       - Non-bleeding internal hemorrhoids.                       - Medium-sized lipoma in the proximal transverse colon.                       - A single non-bleeding colonic angiodysplastic lesion.                       - The distal rectum and anal verge are normal on                        retroflexion view.                       - No specimens collected. Recommendation:       - Discharge patient to home.                       - consider small bowel series and possible VCE if heme                        positive. Procedure Code(s):    --- Professional ---                       [phone], Colonoscopy, flexible; diagnostic, including                        collection of specimen(s) by brushing or washing, when                        performed (separate procedure) Diagnosis Code(s):    --- Professional ---                       K64.0, First degree hemorrhoids                       D17.5, Benign lipomatous neoplasm of intra-abdominal                        organs  K55.20, Angiodysplasia of colon without hemorrhage                       D50.9, Iron deficiency anemia, unspecified                       Q43.8, Other specified congenital malformations of                        intestine CPT copyright 2018 American Medical Association. All rights reserved. The codes documented in this report are preliminary and upon coder review may  be revised to meet current compliance requirements. Lollie Sails,  MD 11/09/2017 8:32:56 AM This report has been signed electronically. Number of Addenda: 0 Note Initiated On: 11/09/2017 7:24 AM Scope Withdrawal Time: 0 hours 5 minutes 29 seconds  Total Procedure Duration: 0 hours 19 minutes 30 seconds       [facility]

## 2017-11-09 NOTE — Anesthesia Postprocedure Evaluation (Signed)
Anesthesia Post Note  Patient: Wanda Ruiz  Procedure(s) Performed: COLONOSCOPY WITH PROPOFOL (N/A ) ESOPHAGOGASTRODUODENOSCOPY (EGD) WITH PROPOFOL (N/A )  Patient location during evaluation: Endoscopy Anesthesia Type: General Level of consciousness: awake and alert Pain management: pain level controlled Vital Signs Assessment: post-procedure vital signs reviewed and stable Respiratory status: spontaneous breathing and respiratory function stable Cardiovascular status: stable Anesthetic complications: no     Last Vitals:  Vitals:   11/09/17 0710 11/09/17 0829  BP: (!) 141/87 (!) 88/54  Pulse: 76   Resp: 18   Temp: (!) 35.6 C (!) 36.1 C  SpO2: 98%     Last Pain:  Vitals:   11/09/17 0829  TempSrc: Tympanic  PainSc:                  Wanda Ruiz

## 2017-11-09 NOTE — H&P (Signed)
Outpatient short stay form Pre-procedure 11/09/2017 7:36 AM Lollie Sails MD  Primary Physician: Dr. Einar Pheasant  Reason for visit: EGD and colonoscopy  History of present illness: Patient is a 82 year old female presenting today for further evaluation of iron deficiency anemia.  She does take 81 mg aspirin daily that has been held.  She takes no other aspirin products or blood thinning agent.  She denies any problems with rectal bleeding abdominal pain or change of bowel habits/diarrhea.  She did have a EGD and colonoscopy in 2014 unremarkable for etiology of iron deficiency at that time.  He has more recently been checked for blood in the stool and these were negative.  sHe tolerated her prep well.    Current Facility-Administered Medications:  .  0.9 %  sodium chloride infusion, , Intravenous, Continuous, Lollie Sails, MD, Last Rate: 20 mL/hr at 11/09/17 0732, 1,000 mL at 11/09/17 0732  Medications Prior to Admission  Medication Sig Dispense Refill Last Dose  . ACCU-CHEK AVIVA PLUS test strip CHECK BLOOD SUGAR TWICE A DAY 200 each 12 11/09/2017 at Unknown time  . albuterol (PROVENTIL HFA;VENTOLIN HFA) 108 (90 Base) MCG/ACT inhaler Inhale 2 puffs into the lungs every 6 (six) hours as needed for wheezing or shortness of breath.    at prn  . aspirin 81 MG tablet Take 81 mg by mouth daily.   Past Week at Unknown time  . calcium-vitamin D (OSCAL WITH D) 250-125 MG-UNIT tablet Take 1 tablet by mouth daily.   Past Week at Unknown time  . ezetimibe (ZETIA) 10 MG tablet TAKE 1 TABLET BY MOUTH EVERY DAY 90 tablet 0 11/08/2017 at Unknown time  . glipiZIDE (GLUCOTROL XL) 5 MG 24 hr tablet TAKE 1 TABLET DAILY 90 tablet 3 11/08/2017 at Unknown time  . lisinopril (PRINIVIL,ZESTRIL) 10 MG tablet TAKE 1 TABLET DAILY 90 tablet 3 11/09/2017 at 0530  . metFORMIN (GLUCOPHAGE) 500 MG tablet TAKE 2 TABLETS TWICE A DAY 360 tablet 3 11/08/2017 at Unknown time  . Multiple Vitamin (MULTIVITAMIN) tablet  Take 1 tablet by mouth daily.   Past Week at Unknown time  . omeprazole (PRILOSEC) 20 MG capsule TAKE 1 CAPSULE DAILY 90 capsule 3 11/08/2017 at Unknown time  . ACCU-CHEK SOFTCLIX LANCETS lancets CHECK BLOOD SUGAR ONCE A DAY DX E11.9  11 Taking     No Known Allergies   Past Medical History:  Diagnosis Date  . Anemia   . Diabetes mellitus ([place])   . GERD (gastroesophageal reflux disease)   . Hypercholesterolemia   . Hypertension   . Hypothyroidism   . Osteoporosis     Review of systems:      Physical Exam    Heart and lungs: Regular rate and rhythm without rub or gallop, lungs are bilaterally clear.    HEENT: Normocephalic atraumatic eyes are anicteric    Other:    Pertinant exam for procedure: Soft nontender nondistended bowel sounds positive normoactive    Planned proceedures: EGD, colonoscopy and indicated procedures. I have discussed the risks benefits and complications of procedures to include not limited to bleeding, infection, perforation and the risk of sedation and the patient wishes to proceed.    Lollie Sails, MD Gastroenterology 11/09/2017  7:36 AM

## 2017-11-09 NOTE — Anesthesia Post-op Follow-up Note (Signed)
Anesthesia QCDR form completed.        

## 2017-11-09 NOTE — Op Note (Signed)
[facility] Gastroenterology Patient Name: Wanda Ruiz Procedure Date: 11/09/2017 7:25 AM MRN: [id number] Account #: [id number] Date of Birth: 1935-05-10 Admit Type: Outpatient Age: 82 Room: [facility] ENDO ROOM 3 Gender: Female Note Status: Finalized Procedure:            Upper GI endoscopy Indications:          Iron deficiency anemia Providers:            Lollie Sails, MD Medicines:            Monitored Anesthesia Care Complications:        No immediate complications. Procedure:            Pre-Anesthesia Assessment:                       - ASA Grade Assessment: III - A patient with severe                        systemic disease.                       After obtaining informed consent, the endoscope was                        passed under direct vision. Throughout the procedure,                        the patient's blood pressure, pulse, and oxygen                        saturations were monitored continuously. The Endoscope                        was introduced through the mouth, and advanced to the                        third part of duodenum. The upper GI endoscopy was                        accomplished without difficulty. The patient tolerated                        the procedure well. Findings:      The Z-line was variable. Biopsies were taken with a cold forceps for       histology.      The exam of the esophagus was otherwise normal.      The gastric antrum was normal.      Diffuse and patchy minimal inflammation characterized by congestion       (edema) was found in the gastric body. Biopsies were taken with a cold       forceps for histology.      The exam of the stomach was otherwise normal.      The cardia and gastric fundus were normal on retroflexion.      Diffuse mild mucosal variance characterized by smoothness was found in       the entire duodenum. Biopsies were taken with a cold forceps for       histology. Impression:           -  Z-line variable. Biopsied.                       -  Normal antrum.                       - Gastritis. Biopsied.                       - Mucosal variant in the duodenum. Biopsied. Recommendation:       - Await pathology results.                       - Continue present medications.                       - Return to GI clinic in 3 weeks. Procedure Code(s):    --- Professional ---                       [phone], Esophagogastroduodenoscopy, flexible, transoral;                        with biopsy, single or multiple Diagnosis Code(s):    --- Professional ---                       K22.8, Other specified diseases of esophagus                       K29.70, Gastritis, unspecified, without bleeding                       K31.89, Other diseases of stomach and duodenum                       D50.9, Iron deficiency anemia, unspecified CPT copyright 2018 American Medical Association. All rights reserved. The codes documented in this report are preliminary and upon coder review may  be revised to meet current compliance requirements. Lollie Sails, MD 11/09/2017 8:02:27 AM This report has been signed electronically. Number of Addenda: 0 Note Initiated On: 11/09/2017 7:25 AM      [facility]

## 2017-11-12 ENCOUNTER — Encounter: Payer: Self-pay | Admitting: Gastroenterology

## 2017-11-13 LAB — SURGICAL PATHOLOGY

## 2017-11-20 ENCOUNTER — Other Ambulatory Visit (INDEPENDENT_AMBULATORY_CARE_PROVIDER_SITE_OTHER): Payer: Medicare Other

## 2017-11-20 DIAGNOSIS — M8580 Other specified disorders of bone density and structure, unspecified site: Secondary | ICD-10-CM | POA: Diagnosis not present

## 2017-11-20 DIAGNOSIS — I10 Essential (primary) hypertension: Secondary | ICD-10-CM

## 2017-11-20 DIAGNOSIS — E119 Type 2 diabetes mellitus without complications: Secondary | ICD-10-CM | POA: Diagnosis not present

## 2017-11-20 DIAGNOSIS — D649 Anemia, unspecified: Secondary | ICD-10-CM | POA: Diagnosis not present

## 2017-11-20 DIAGNOSIS — E7800 Pure hypercholesterolemia, unspecified: Secondary | ICD-10-CM

## 2017-11-20 LAB — CBC WITH DIFFERENTIAL/PLATELET
BASOS ABS: 0.1 10*3/uL (ref 0.0–0.1)
Basophils Relative: 1.3 % (ref 0.0–3.0)
EOS ABS: 0.1 10*3/uL (ref 0.0–0.7)
Eosinophils Relative: 1.7 % (ref 0.0–5.0)
HEMATOCRIT: 34.8 % — AB (ref 36.0–46.0)
HEMOGLOBIN: 11.1 g/dL — AB (ref 12.0–15.0)
LYMPHS ABS: 1.8 10*3/uL (ref 0.7–4.0)
LYMPHS PCT: 25.2 % (ref 12.0–46.0)
MCHC: 31.9 g/dL (ref 30.0–36.0)
MCV: 78.3 fl (ref 78.0–100.0)
MONOS PCT: 8 % (ref 3.0–12.0)
Monocytes Absolute: 0.6 10*3/uL (ref 0.1–1.0)
NEUTROS ABS: 4.6 10*3/uL (ref 1.4–7.7)
Neutrophils Relative %: 63.8 % (ref 43.0–77.0)
Platelets: 278 10*3/uL (ref 150.0–400.0)
RBC: 4.44 Mil/uL (ref 3.87–5.11)
RDW: 16.9 % — AB (ref 11.5–15.5)
WBC: 7.3 10*3/uL (ref 4.0–10.5)

## 2017-11-20 LAB — BASIC METABOLIC PANEL
BUN: 17 mg/dL (ref 6–23)
CALCIUM: 9.2 mg/dL (ref 8.4–10.5)
CHLORIDE: 106 meq/L (ref 96–112)
CO2: 26 meq/L (ref 19–32)
CREATININE: 0.75 mg/dL (ref 0.40–1.20)
GFR: 95.47 mL/min (ref >60.00)
GLUCOSE: 127 mg/dL — AB (ref 70–99)
Potassium: 4.5 mEq/L (ref 3.5–5.1)
Sodium: 139 mEq/L (ref 135–145)

## 2017-11-20 LAB — LIPID PANEL
CHOL/HDL RATIO: 3
Cholesterol: 168 mg/dL (ref 0–200)
HDL: 48.5 mg/dL (ref >39.00)
LDL CALC: 104 mg/dL — AB (ref 0–99)
NONHDL: 119.67
Triglycerides: 78 mg/dL (ref 0.0–149.0)
VLDL: 15.6 mg/dL (ref 0.0–40.0)

## 2017-11-20 LAB — HEMOGLOBIN A1C: HEMOGLOBIN A1C: 6.8 % — AB (ref 4.6–6.5)

## 2017-11-20 LAB — HEPATIC FUNCTION PANEL
ALBUMIN: 4.3 g/dL (ref 3.5–5.2)
ALT: 11 U/L (ref 0–35)
AST: 12 U/L (ref 0–37)
Alkaline Phosphatase: 41 U/L (ref 39–117)
Bilirubin, Direct: 0.1 mg/dL (ref 0.0–0.3)
TOTAL PROTEIN: 7.1 g/dL (ref 6.0–8.3)
Total Bilirubin: 0.5 mg/dL (ref 0.2–1.2)

## 2017-11-20 LAB — VITAMIN D 25 HYDROXY (VIT D DEFICIENCY, FRACTURES): VITD: 32.39 ng/mL (ref 30.00–100.00)

## 2017-11-20 LAB — TSH: TSH: 0.44 u[IU]/mL (ref 0.35–4.50)

## 2017-11-20 LAB — FERRITIN: FERRITIN: 121 ng/mL (ref 10.0–291.0)

## 2017-11-20 LAB — MICROALBUMIN / CREATININE URINE RATIO
Creatinine,U: 87.1 mg/dL
Microalb Creat Ratio: 2.3 mg/g (ref 0.0–30.0)
Microalb, Ur: 2 mg/dL — ABNORMAL HIGH (ref 0.0–1.9)

## 2017-11-22 ENCOUNTER — Encounter: Payer: Self-pay | Admitting: Internal Medicine

## 2017-11-22 ENCOUNTER — Ambulatory Visit: Payer: Medicare Other | Admitting: Internal Medicine

## 2017-11-22 DIAGNOSIS — Z23 Encounter for immunization: Secondary | ICD-10-CM | POA: Diagnosis not present

## 2017-11-22 DIAGNOSIS — E119 Type 2 diabetes mellitus without complications: Secondary | ICD-10-CM

## 2017-11-22 DIAGNOSIS — E7800 Pure hypercholesterolemia, unspecified: Secondary | ICD-10-CM

## 2017-11-22 DIAGNOSIS — D649 Anemia, unspecified: Secondary | ICD-10-CM

## 2017-11-22 DIAGNOSIS — I10 Essential (primary) hypertension: Secondary | ICD-10-CM | POA: Diagnosis not present

## 2017-11-22 LAB — HM DIABETES FOOT EXAM

## 2017-11-22 NOTE — Progress Notes (Signed)
[name], female   DOB: [birth year], 82 y.o.   MRN: [id number]   Subjective:    [name], female    DOB: April 30, 1935, 82 y.o.   MRN: [id number]  HPI  Patient here for a scheduled follow up.  Is seeing GI for evaluation and work up for iron deficient anemia.  Is s/p EGD and colonoscopy 11/09/17.  Has f/u planned with GI.  Upper symptoms controlled.  Eating. No nausea or vomiting.  Bowels moving.  No chest pain.  No sob.  No abdominal pain.  Handling stress.     Past Medical History:  Diagnosis Date  . Anemia   . Diabetes mellitus ([place])   . GERD (gastroesophageal reflux disease)   . Hypercholesterolemia   . Hypertension   . Hypothyroidism   . Osteoporosis    Past Surgical History:  Procedure Laterality Date  . COLONOSCOPY    . COLONOSCOPY WITH PROPOFOL N/A 11/09/2017   Procedure: COLONOSCOPY WITH PROPOFOL;  Surgeon: Lollie Sails, MD;  Location: [facility] ENDOSCOPY;  Service: Endoscopy;  Laterality: N/A;  . CYST EXCISION     low back  . ESOPHAGOGASTRODUODENOSCOPY (EGD) WITH PROPOFOL N/A 11/09/2017   Procedure: ESOPHAGOGASTRODUODENOSCOPY (EGD) WITH PROPOFOL;  Surgeon: Lollie Sails, MD;  Location: [facility] ENDOSCOPY;  Service: Endoscopy;  Laterality: N/A;  . THYROID SURGERY  1992   left hemi-thyroidectomy (colloid goiter0  . UPPER GASTROINTESTINAL ENDOSCOPY     Family History  Problem Relation Age of Onset  . Emphysema Father   . Heart disease Mother        myocardial infarction (69)  . Hypertension Mother   . Diabetes Mother   . Heart disease Brother        drug use  . Diabetes Mellitus II Sister        x3  . Hypertension Sister   . Breast cancer Unknown        niece  . Breast cancer Sister   . Cancer Sister   . Colon cancer Neg Hx    Social History   Socioeconomic History  . Marital status: Married    Spouse name: Not on file  . Number of children: 2  . Years of education: Not on file  . Highest education level: Not on file    Occupational History  . Not on file  Social Needs  . Financial resource strain: Not hard at all  . Food insecurity:    Worry: Never true    Inability: Never true  . Transportation needs:    Medical: No    Non-medical: No  Tobacco Use  . Smoking status: Never Smoker  . Smokeless tobacco: Never Used  Substance and Sexual Activity  . Alcohol use: No    Alcohol/week: 0.0 standard drinks    Comment: occasional  . Drug use: Never  . Sexual activity: Not on file  Lifestyle  . Physical activity:    Days per week: Not on file    Minutes per session: Not on file  . Stress: Not on file  Relationships  . Social connections:    Talks on phone: Not on file    Gets together: Not on file    Attends religious service: Not on file    Active member of club or organization: Not on file    Attends meetings of clubs or organizations: Not on file    Relationship status: Not on file  Other Topics Concern  . Not on file  Social History  Narrative  . Not on file    Outpatient Encounter Medications as of 11/22/2017  Medication Sig  . ACCU-CHEK AVIVA PLUS test strip CHECK BLOOD SUGAR TWICE A DAY  . ACCU-CHEK SOFTCLIX LANCETS lancets CHECK BLOOD SUGAR ONCE A DAY DX E11.9  . albuterol (PROVENTIL HFA;VENTOLIN HFA) 108 (90 Base) MCG/ACT inhaler Inhale 2 puffs into the lungs every 6 (six) hours as needed for wheezing or shortness of breath.  Marland Kitchen aspirin 81 MG tablet Take 81 mg by mouth daily.  . calcium-vitamin D (OSCAL WITH D) 250-125 MG-UNIT tablet Take 1 tablet by mouth daily.  Marland Kitchen ezetimibe (ZETIA) 10 MG tablet TAKE 1 TABLET BY MOUTH EVERY DAY  . gabapentin (NEURONTIN) 300 MG capsule TAKE 1 CAPSULE BY MOUTH THREE TIMES A DAY  . glipiZIDE (GLUCOTROL XL) 5 MG 24 hr tablet TAKE 1 TABLET DAILY  . lisinopril (PRINIVIL,ZESTRIL) 10 MG tablet TAKE 1 TABLET DAILY  . metFORMIN (GLUCOPHAGE) 500 MG tablet TAKE 2 TABLETS TWICE A DAY  . Multiple Vitamin (MULTIVITAMIN) tablet Take 1 tablet by mouth daily.  Marland Kitchen  omeprazole (PRILOSEC) 20 MG capsule TAKE 1 CAPSULE DAILY   No facility-administered encounter medications on file as of 11/22/2017.     Review of Systems  Constitutional: Negative for appetite change and unexpected weight change.  HENT: Negative for congestion and sinus pressure.   Respiratory: Negative for cough, chest tightness and shortness of breath.   Cardiovascular: Negative for chest pain, palpitations and leg swelling.  Gastrointestinal: Negative for abdominal pain, diarrhea, nausea and vomiting.  Genitourinary: Negative for difficulty urinating and dysuria.  Musculoskeletal: Negative for joint swelling and myalgias.  Skin: Negative for color change and rash.  Neurological: Negative for dizziness, light-headedness and headaches.  Psychiatric/Behavioral: Negative for agitation and dysphoric mood.       Objective:    Physical Exam  Constitutional: She appears well-developed and well-nourished. No distress.  HENT:  Nose: Nose normal.  Mouth/Throat: Oropharynx is clear and moist.  Neck: Neck supple. No thyromegaly present.  Cardiovascular: Normal rate and regular rhythm.  Pulmonary/Chest: Breath sounds normal. No respiratory distress. She has no wheezes.  Abdominal: Soft. Bowel sounds are normal. There is no tenderness.  Musculoskeletal: She exhibits no edema or tenderness.  Feet:  No lesions.  DP pulses palpable and equal bilaterally.  Intact to light touch and pinprick.    Lymphadenopathy:    She has no cervical adenopathy.  Skin: No rash noted. No erythema.  Psychiatric: She has a normal mood and affect. Her behavior is normal.    BP 136/72 (BP Location: Left Arm, Patient Position: Sitting, Cuff Size: Normal)   Pulse 70   Temp 98.1 F (36.7 C) (Oral)   Resp 18   Wt 145 lb 3.2 oz (65.9 kg)   SpO2 99%   BMI 25.72 kg/m  Wt Readings from Last 3 Encounters:  11/22/17 145 lb 3.2 oz (65.9 kg)  11/09/17 139 lb (63 kg)  08/10/17 140 lb 4 oz (63.6 kg)     Lab  Results  Component Value Date   WBC 7.3 11/20/2017   HGB 11.1 (L) 11/20/2017   HCT 34.8 (L) 11/20/2017   PLT 278.0 11/20/2017   GLUCOSE 127 (H) 11/20/2017   CHOL 168 11/20/2017   TRIG 78.0 11/20/2017   HDL 48.50 11/20/2017   LDLCALC 104 (H) 11/20/2017   ALT 11 11/20/2017   AST 12 11/20/2017   NA 139 11/20/2017   K 4.5 11/20/2017   CL 106 11/20/2017   CREATININE 0.75  11/20/2017   BUN 17 11/20/2017   CO2 26 11/20/2017   TSH 0.44 11/20/2017   HGBA1C 6.8 (H) 11/20/2017   MICROALBUR 2.0 (H) 11/20/2017       Assessment & Plan:   Problem List Items Addressed This Visit    Anemia    Being evaluated by GI.  Just had EGD and colonoscopy 11/09/17.  Was given another set of stool cards with planned f/u.        Relevant Orders   Ferritin   CBC with Differential/Platelet   Diabetes mellitus ([place])    Low carb diet and exercise.  Follow met b and a1c.   Lab Results  Component Value Date   HGBA1C 6.8 (H) 11/20/2017        Relevant Orders   Hemoglobin A1c   Hypercholesterolemia    Unable to take statin medication.  On zetia.  Discussed cholesterol results.  Follow lipid panel.   Lab Results  Component Value Date   CHOL 168 11/20/2017   HDL 48.50 11/20/2017   LDLCALC 104 (H) 11/20/2017   TRIG 78.0 11/20/2017   CHOLHDL 3 11/20/2017        Relevant Orders   Hepatic function panel   Lipid panel   Hypertension    Blood pressure under good control.  Continue same medication regimen.  Follow pressures.  Follow metabolic panel.        Relevant Orders   Basic metabolic panel    Other Visit Diagnoses    Encounter for immunization       Relevant Orders   Flu vaccine HIGH DOSE PF (Completed)       Einar Pheasant, MD

## 2017-11-25 ENCOUNTER — Encounter: Payer: Self-pay | Admitting: Internal Medicine

## 2017-11-25 NOTE — Assessment & Plan Note (Signed)
Blood pressure under good control.  Continue same medication regimen.  Follow pressures.  Follow metabolic panel.   

## 2017-11-25 NOTE — Assessment & Plan Note (Signed)
Low carb diet and exercise.  Follow met b and a1c.   Lab Results  Component Value Date   HGBA1C 6.8 (H) 11/20/2017

## 2017-11-25 NOTE — Assessment & Plan Note (Signed)
Unable to take statin medication.  On zetia.  Discussed cholesterol results.  Follow lipid panel.   Lab Results  Component Value Date   CHOL 168 11/20/2017   HDL 48.50 11/20/2017   LDLCALC 104 (H) 11/20/2017   TRIG 78.0 11/20/2017   CHOLHDL 3 11/20/2017

## 2017-11-25 NOTE — Assessment & Plan Note (Signed)
Being evaluated by GI.  Just had EGD and colonoscopy 11/09/17.  Was given another set of stool cards with planned f/u.

## 2017-12-05 ENCOUNTER — Other Ambulatory Visit: Payer: Self-pay | Admitting: Internal Medicine

## 2018-01-27 ENCOUNTER — Other Ambulatory Visit: Payer: Self-pay | Admitting: Internal Medicine

## 2018-02-05 LAB — HM DIABETES EYE EXAM

## 2018-03-01 ENCOUNTER — Other Ambulatory Visit: Payer: Self-pay | Admitting: Internal Medicine

## 2018-03-11 ENCOUNTER — Other Ambulatory Visit: Payer: Self-pay | Admitting: Internal Medicine

## 2018-03-24 ENCOUNTER — Other Ambulatory Visit: Payer: Self-pay | Admitting: Internal Medicine

## 2018-03-27 ENCOUNTER — Other Ambulatory Visit (INDEPENDENT_AMBULATORY_CARE_PROVIDER_SITE_OTHER): Payer: Medicare Other

## 2018-03-27 ENCOUNTER — Other Ambulatory Visit: Payer: Self-pay

## 2018-03-27 DIAGNOSIS — E7800 Pure hypercholesterolemia, unspecified: Secondary | ICD-10-CM

## 2018-03-27 DIAGNOSIS — E119 Type 2 diabetes mellitus without complications: Secondary | ICD-10-CM

## 2018-03-27 DIAGNOSIS — I10 Essential (primary) hypertension: Secondary | ICD-10-CM | POA: Diagnosis not present

## 2018-03-27 DIAGNOSIS — D649 Anemia, unspecified: Secondary | ICD-10-CM | POA: Diagnosis not present

## 2018-03-27 LAB — LIPID PANEL
CHOL/HDL RATIO: 4
Cholesterol: 184 mg/dL (ref 0–200)
HDL: 50.4 mg/dL (ref >39.00)
LDL Cholesterol: 114 mg/dL — ABNORMAL HIGH (ref 0–99)
NonHDL: 133.72
Triglycerides: 99 mg/dL (ref 0.0–149.0)
VLDL: 19.8 mg/dL (ref 0.0–40.0)

## 2018-03-27 LAB — HEPATIC FUNCTION PANEL
ALT: 10 U/L (ref 0–35)
AST: 10 U/L (ref 0–37)
Albumin: 4.2 g/dL (ref 3.5–5.2)
Alkaline Phosphatase: 41 U/L (ref 39–117)
Bilirubin, Direct: 0.1 mg/dL (ref 0.0–0.3)
Total Bilirubin: 0.6 mg/dL (ref 0.2–1.2)
Total Protein: 7 g/dL (ref 6.0–8.3)

## 2018-03-27 LAB — CBC WITH DIFFERENTIAL/PLATELET
Basophils Absolute: 0.1 10*3/uL (ref 0.0–0.1)
Basophils Relative: 1.3 % (ref 0.0–3.0)
EOS PCT: 1.6 % (ref 0.0–5.0)
Eosinophils Absolute: 0.1 10*3/uL (ref 0.0–0.7)
HEMATOCRIT: 35.7 % — AB (ref 36.0–46.0)
Hemoglobin: 11.3 g/dL — ABNORMAL LOW (ref 12.0–15.0)
Lymphocytes Relative: 22.3 % (ref 12.0–46.0)
Lymphs Abs: 1.9 10*3/uL (ref 0.7–4.0)
MCHC: 31.7 g/dL (ref 30.0–36.0)
MCV: 78.8 fl (ref 78.0–100.0)
Monocytes Absolute: 0.7 10*3/uL (ref 0.1–1.0)
Monocytes Relative: 8.4 % (ref 3.0–12.0)
Neutro Abs: 5.7 10*3/uL (ref 1.4–7.7)
Neutrophils Relative %: 66.4 % (ref 43.0–77.0)
Platelets: 264 10*3/uL (ref 150.0–400.0)
RBC: 4.53 Mil/uL (ref 3.87–5.11)
RDW: 16.7 % — ABNORMAL HIGH (ref 11.5–15.5)
WBC: 8.7 10*3/uL (ref 4.0–10.5)

## 2018-03-27 LAB — BASIC METABOLIC PANEL
BUN: 17 mg/dL (ref 6–23)
CO2: 26 meq/L (ref 19–32)
CREATININE: 0.78 mg/dL (ref 0.40–1.20)
Calcium: 9.3 mg/dL (ref 8.4–10.5)
Chloride: 103 mEq/L (ref 96–112)
GFR: 85.77 mL/min (ref >60.00)
Glucose, Bld: 129 mg/dL — ABNORMAL HIGH (ref 70–99)
Potassium: 4.7 mEq/L (ref 3.5–5.1)
Sodium: 136 mEq/L (ref 135–145)

## 2018-03-27 LAB — FERRITIN: Ferritin: 129.4 ng/mL (ref 10.0–291.0)

## 2018-03-27 LAB — HEMOGLOBIN A1C: Hgb A1c MFr Bld: 7.5 % — ABNORMAL HIGH (ref 4.6–6.5)

## 2018-03-29 ENCOUNTER — Encounter: Payer: Self-pay | Admitting: Internal Medicine

## 2018-03-29 ENCOUNTER — Other Ambulatory Visit: Payer: Self-pay

## 2018-03-29 ENCOUNTER — Ambulatory Visit: Payer: Medicare Other | Admitting: Internal Medicine

## 2018-03-29 VITALS — BP 108/62 | HR 74 | Temp 98.0°F | Resp 16 | Wt 140.8 lb

## 2018-03-29 DIAGNOSIS — Z1231 Encounter for screening mammogram for malignant neoplasm of breast: Secondary | ICD-10-CM | POA: Diagnosis not present

## 2018-03-29 DIAGNOSIS — E7800 Pure hypercholesterolemia, unspecified: Secondary | ICD-10-CM | POA: Diagnosis not present

## 2018-03-29 DIAGNOSIS — R200 Anesthesia of skin: Secondary | ICD-10-CM

## 2018-03-29 DIAGNOSIS — E119 Type 2 diabetes mellitus without complications: Secondary | ICD-10-CM

## 2018-03-29 DIAGNOSIS — R202 Paresthesia of skin: Secondary | ICD-10-CM

## 2018-03-29 DIAGNOSIS — D649 Anemia, unspecified: Secondary | ICD-10-CM

## 2018-03-29 DIAGNOSIS — I10 Essential (primary) hypertension: Secondary | ICD-10-CM

## 2018-03-29 NOTE — Progress Notes (Signed)
[name], female   DOB: [birth year], 83 y.o.   MRN: [id number]   Subjective:    [name], female    DOB: 09/17/1935, 83 y.o.   MRN: [id number]  HPI  Patient here for a scheduled follow up.  Increased stress recently.  Her daughter passed away 25-Mar-2018.  Discussed with her today.  She has good support.  Does not feel she needs anything more at this time.  No chest pain.  No sob.  No acid reflux.  No abdominal ain.  Bowels moving.  No urine change.  Discussed labs.  On iron.  hgb stable.     Past Medical History:  Diagnosis Date  . Anemia   . Diabetes mellitus ([place])   . GERD (gastroesophageal reflux disease)   . Hypercholesterolemia   . Hypertension   . Hypothyroidism   . Osteoporosis    Past Surgical History:  Procedure Laterality Date  . COLONOSCOPY    . COLONOSCOPY WITH PROPOFOL N/A 11/09/2017   Procedure: COLONOSCOPY WITH PROPOFOL;  Surgeon: Lollie Sails, MD;  Location: [facility] ENDOSCOPY;  Service: Endoscopy;  Laterality: N/A;  . CYST EXCISION     low back  . ESOPHAGOGASTRODUODENOSCOPY (EGD) WITH PROPOFOL N/A 11/09/2017   Procedure: ESOPHAGOGASTRODUODENOSCOPY (EGD) WITH PROPOFOL;  Surgeon: Lollie Sails, MD;  Location: [facility] ENDOSCOPY;  Service: Endoscopy;  Laterality: N/A;  . THYROID SURGERY  1992   left hemi-thyroidectomy (colloid goiter0  . UPPER GASTROINTESTINAL ENDOSCOPY     Family History  Problem Relation Age of Onset  . Emphysema Father   . Heart disease Mother        myocardial infarction (69)  . Hypertension Mother   . Diabetes Mother   . Heart disease Brother        drug use  . Diabetes Mellitus II Sister        x3  . Hypertension Sister   . Breast cancer Other        niece  . Breast cancer Sister   . Cancer Sister   . Colon cancer Neg Hx    Social History   Socioeconomic History  . Marital status: Married    Spouse name: Not on file  . Number of children: 2  . Years of education: Not on file  . Highest  education level: Not on file  Occupational History  . Not on file  Social Needs  . Financial resource strain: Not hard at all  . Food insecurity:    Worry: Never true    Inability: Never true  . Transportation needs:    Medical: No    Non-medical: No  Tobacco Use  . Smoking status: Never Smoker  . Smokeless tobacco: Never Used  Substance and Sexual Activity  . Alcohol use: No    Alcohol/week: 0.0 standard drinks    Comment: occasional  . Drug use: Never  . Sexual activity: Not on file  Lifestyle  . Physical activity:    Days per week: Not on file    Minutes per session: Not on file  . Stress: Not on file  Relationships  . Social connections:    Talks on phone: Not on file    Gets together: Not on file    Attends religious service: Not on file    Active member of club or organization: Not on file    Attends meetings of clubs or organizations: Not on file    Relationship status: Not on file  Other Topics Concern  .  Not on file  Social History Narrative  . Not on file    Outpatient Encounter Medications as of 03/29/2018  Medication Sig  . ACCU-CHEK AVIVA PLUS test strip CHECK BLOOD SUGAR TWICE A DAY  . ACCU-CHEK SOFTCLIX LANCETS lancets CHECK BLOOD SUGAR ONCE A DAY DX E11.9  . albuterol (PROVENTIL HFA;VENTOLIN HFA) 108 (90 Base) MCG/ACT inhaler Inhale 2 puffs into the lungs every 6 (six) hours as needed for wheezing or shortness of breath.  Marland Kitchen aspirin 81 MG tablet Take 81 mg by mouth daily.  . calcium-vitamin D (OSCAL WITH D) 250-125 MG-UNIT tablet Take 1 tablet by mouth daily.  Marland Kitchen ezetimibe (ZETIA) 10 MG tablet TAKE 1 TABLET BY MOUTH EVERY DAY  . gabapentin (NEURONTIN) 300 MG capsule TAKE 1 CAPSULE BY MOUTH THREE TIMES A DAY  . glipiZIDE (GLUCOTROL XL) 5 MG 24 hr tablet TAKE 1 TABLET DAILY  . lisinopril (PRINIVIL,ZESTRIL) 10 MG tablet TAKE 1 TABLET DAILY  . metFORMIN (GLUCOPHAGE) 500 MG tablet TAKE 2 TABLETS TWICE A DAY  . Multiple Vitamin (MULTIVITAMIN) tablet Take 1  tablet by mouth daily.  Marland Kitchen omeprazole (PRILOSEC) 20 MG capsule TAKE 1 CAPSULE DAILY   No facility-administered encounter medications on file as of 03/29/2018.     Review of Systems  Constitutional: Negative for appetite change and unexpected weight change.  HENT: Negative for congestion and sinus pressure.   Respiratory: Negative for cough, chest tightness and shortness of breath.   Cardiovascular: Negative for chest pain, palpitations and leg swelling.  Gastrointestinal: Negative for abdominal pain, diarrhea, nausea and vomiting.  Genitourinary: Negative for difficulty urinating and dysuria.  Musculoskeletal: Negative for joint swelling and myalgias.  Skin: Negative for color change and rash.  Neurological: Negative for dizziness, light-headedness and headaches.  Psychiatric/Behavioral: Negative for agitation and dysphoric mood.       Objective:    Physical Exam Constitutional:      General: She is not in acute distress.    Appearance: Normal appearance.  HENT:     Nose: Nose normal. No congestion.     Mouth/Throat:     Pharynx: No oropharyngeal exudate or posterior oropharyngeal erythema.  Neck:     Musculoskeletal: Neck supple. No muscular tenderness.     Thyroid: No thyromegaly.  Cardiovascular:     Rate and Rhythm: Normal rate and regular rhythm.  Pulmonary:     Effort: No respiratory distress.     Breath sounds: Normal breath sounds. No wheezing.  Abdominal:     General: Bowel sounds are normal.     Palpations: Abdomen is soft.     Tenderness: There is no abdominal tenderness.  Musculoskeletal:        General: No swelling or tenderness.  Lymphadenopathy:     Cervical: No cervical adenopathy.  Skin:    Findings: No erythema or rash.  Neurological:     Mental Status: She is alert.  Psychiatric:        Mood and Affect: Mood normal.        Behavior: Behavior normal.     BP 108/62   Pulse 74   Temp 98 F (36.7 C) (Oral)   Resp 16   Wt 140 lb 12.8 oz (63.9  kg)   SpO2 97%   BMI 24.94 kg/m  Wt Readings from Last 3 Encounters:  03/29/18 140 lb 12.8 oz (63.9 kg)  11/22/17 145 lb 3.2 oz (65.9 kg)  11/09/17 139 lb (63 kg)     Lab Results  Component Value Date  WBC 8.7 03/27/2018   HGB 11.3 (L) 03/27/2018   HCT 35.7 (L) 03/27/2018   PLT 264.0 03/27/2018   GLUCOSE 129 (H) 03/27/2018   CHOL 184 03/27/2018   TRIG 99.0 03/27/2018   HDL 50.40 03/27/2018   LDLCALC 114 (H) 03/27/2018   ALT 10 03/27/2018   AST 10 03/27/2018   NA 136 03/27/2018   K 4.7 03/27/2018   CL 103 03/27/2018   CREATININE 0.78 03/27/2018   BUN 17 03/27/2018   CO2 26 03/27/2018   TSH 0.44 11/20/2017   HGBA1C 7.5 (H) 03/27/2018   MICROALBUR 2.0 (H) 11/20/2017       Assessment & Plan:   Problem List Items Addressed This Visit    Anemia    Has been evaluated by GI.  On iron.  Follow cbc and ferritin.        Relevant Orders   CBC with Differential/Platelet   Ferritin   Diabetes mellitus ([place])    Low carb diet and exercise.  Follow met b and a1c.  a1c has increased.  Discussed with her today.  States with everything that has occurred, she has not been taking her medications regularly.  Discussed low carb diet.  Follow.   Lab Results  Component Value Date   HGBA1C 7.5 (H) 03/27/2018        Relevant Orders   Hemoglobin K4C   Basic metabolic panel   Hypercholesterolemia    On zetia.  Unable to take statin medication.  Low cholesterol diet and exercise.  Follow lipid panel.        Relevant Orders   Lipid panel   Hepatic function panel   Hypertension    Blood pressure under good control.  Continue same medication regimen.  Follow pressures.  Follow metabolic panel.        Numbness and tingling    Has been worked up by neurology.  Had MRI.  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  On gabapentin.         Other Visit Diagnoses    Visit for screening mammogram    -  Primary   Relevant Orders   MM 3D SCREEN BREAST BILATERAL        Einar Pheasant, MD

## 2018-03-31 ENCOUNTER — Encounter: Payer: Self-pay | Admitting: Internal Medicine

## 2018-03-31 NOTE — Assessment & Plan Note (Signed)
Has been worked up by neurology.  Had MRI.  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  On gabapentin.   

## 2018-03-31 NOTE — Assessment & Plan Note (Signed)
Blood pressure under good control.  Continue same medication regimen.  Follow pressures.  Follow metabolic panel.   

## 2018-03-31 NOTE — Assessment & Plan Note (Signed)
On zetia.  Unable to take statin medication.  Low cholesterol diet and exercise.  Follow lipid panel.

## 2018-03-31 NOTE — Assessment & Plan Note (Signed)
Low carb diet and exercise.  Follow met b and a1c.  a1c has increased.  Discussed with her today.  States with everything that has occurred, she has not been taking her medications regularly.  Discussed low carb diet.  Follow.   Lab Results  Component Value Date   HGBA1C 7.5 (H) 03/27/2018

## 2018-03-31 NOTE — Assessment & Plan Note (Signed)
Has been evaluated by GI.  On iron.  Follow cbc and ferritin.

## 2018-05-31 ENCOUNTER — Other Ambulatory Visit: Payer: Self-pay | Admitting: Internal Medicine

## 2018-06-13 ENCOUNTER — Other Ambulatory Visit: Payer: Self-pay

## 2018-06-13 ENCOUNTER — Ambulatory Visit: Payer: Medicare Other | Admitting: Internal Medicine

## 2018-06-13 ENCOUNTER — Ambulatory Visit (INDEPENDENT_AMBULATORY_CARE_PROVIDER_SITE_OTHER): Payer: Medicare Other | Admitting: Internal Medicine

## 2018-06-13 ENCOUNTER — Ambulatory Visit (INDEPENDENT_AMBULATORY_CARE_PROVIDER_SITE_OTHER): Payer: Medicare Other

## 2018-06-13 DIAGNOSIS — E119 Type 2 diabetes mellitus without complications: Secondary | ICD-10-CM | POA: Diagnosis not present

## 2018-06-13 DIAGNOSIS — Z1239 Encounter for other screening for malignant neoplasm of breast: Secondary | ICD-10-CM

## 2018-06-13 DIAGNOSIS — E7800 Pure hypercholesterolemia, unspecified: Secondary | ICD-10-CM | POA: Diagnosis not present

## 2018-06-13 DIAGNOSIS — R200 Anesthesia of skin: Secondary | ICD-10-CM

## 2018-06-13 DIAGNOSIS — I10 Essential (primary) hypertension: Secondary | ICD-10-CM | POA: Diagnosis not present

## 2018-06-13 DIAGNOSIS — D649 Anemia, unspecified: Secondary | ICD-10-CM | POA: Diagnosis not present

## 2018-06-13 DIAGNOSIS — R202 Paresthesia of skin: Secondary | ICD-10-CM

## 2018-06-13 DIAGNOSIS — Z0000 Encounter for general adult medical examination without abnormal findings: Secondary | ICD-10-CM

## 2018-06-13 NOTE — Progress Notes (Signed)
Subjective:   Wanda Ruiz is a 83 y.o. female who presents for Medicare Annual (Subsequent) preventive examination.  Review of Systems:  No ROS.  Medicare Wellness Virtual Visit.  Visual/audio telehealth visit, UTA vital signs.   See social history for additional risk factors.   Cardiac Risk Factors include: advanced age (>69men, >70 women);hypertension;diabetes mellitus     Objective:     Vitals: There were no vitals taken for this visit.  There is no height or weight on file to calculate BMI.  Advanced Directives 06/13/2018 11/09/2017 06/11/2017 06/17/2016 06/17/2016 06/09/2016  Does Patient Have a Medical Advance Directive? Yes No No Yes Yes Yes  Type of Advance Directive - - - - Press photographer;Living will Living will;Healthcare Power of Attorney  Does patient want to make changes to medical advance directive? No - Patient declined - - - - No - Patient declined  Copy of [place] in Chart? - - - - - No - copy requested  Would patient like information on creating a medical advance directive? - - No - Patient declined - - -    Tobacco Social History   Tobacco Use  Smoking Status Never Smoker  Smokeless Tobacco Never Used     Counseling given: Not Answered   Clinical Intake:  Pre-visit preparation completed: Yes        Diabetes: Yes(Followed by pcp)  How often do you need to have someone help you when you read instructions, pamphlets, or other written materials from your doctor or pharmacy?: 1 - Never  Interpreter Needed?: No     Past Medical History:  Diagnosis Date  . Anemia   . Diabetes mellitus ([place])   . GERD (gastroesophageal reflux disease)   . Hypercholesterolemia   . Hypertension   . Hypothyroidism   . Osteoporosis    Past Surgical History:  Procedure Laterality Date  . COLONOSCOPY    . COLONOSCOPY WITH PROPOFOL N/A 11/09/2017   Procedure: COLONOSCOPY WITH PROPOFOL;  Surgeon: Lollie Sails, MD;  Location: [facility]  ENDOSCOPY;  Service: Endoscopy;  Laterality: N/A;  . CYST EXCISION     low back  . ESOPHAGOGASTRODUODENOSCOPY (EGD) WITH PROPOFOL N/A 11/09/2017   Procedure: ESOPHAGOGASTRODUODENOSCOPY (EGD) WITH PROPOFOL;  Surgeon: Lollie Sails, MD;  Location: [facility] ENDOSCOPY;  Service: Endoscopy;  Laterality: N/A;  . THYROID SURGERY  1992   left hemi-thyroidectomy (colloid goiter0  . UPPER GASTROINTESTINAL ENDOSCOPY     Family History  Problem Relation Age of Onset  . Emphysema Father   . Heart disease Mother        myocardial infarction (69)  . Hypertension Mother   . Diabetes Mother   . Heart disease Brother        drug use  . Diabetes Mellitus II Sister        x3  . Hypertension Sister   . Breast cancer Other        niece  . Breast cancer Sister   . Cancer Sister   . Colon cancer Neg Hx    Social History   Socioeconomic History  . Marital status: Married    Spouse name: Not on file  . Number of children: 2  . Years of education: Not on file  . Highest education level: Not on file  Occupational History  . Not on file  Social Needs  . Financial resource strain: Not hard at all  . Food insecurity:    Worry: Never true    Inability:  Never true  . Transportation needs:    Medical: No    Non-medical: No  Tobacco Use  . Smoking status: Never Smoker  . Smokeless tobacco: Never Used  Substance and Sexual Activity  . Alcohol use: No    Alcohol/week: 0.0 standard drinks    Comment: occasional  . Drug use: Never  . Sexual activity: Not on file  Lifestyle  . Physical activity:    Days per week: 0 days    Minutes per session: Not on file  . Stress: Not on file  Relationships  . Social connections:    Talks on phone: Not on file    Gets together: Not on file    Attends religious service: Not on file    Active member of club or organization: Not on file    Attends meetings of clubs or organizations: Not on file    Relationship status: Not on file  Other Topics Concern  .  Not on file  Social History Narrative  . Not on file    Outpatient Encounter Medications as of 06/13/2018  Medication Sig  . ACCU-CHEK AVIVA PLUS test strip CHECK BLOOD SUGAR TWICE A DAY  . ACCU-CHEK SOFTCLIX LANCETS lancets CHECK BLOOD SUGAR ONCE A DAY DX E11.9  . albuterol (PROVENTIL HFA;VENTOLIN HFA) 108 (90 Base) MCG/ACT inhaler Inhale 2 puffs into the lungs every 6 (six) hours as needed for wheezing or shortness of breath.  Marland Kitchen aspirin 81 MG tablet Take 81 mg by mouth daily.  . calcium-vitamin D (OSCAL WITH D) 250-125 MG-UNIT tablet Take 1 tablet by mouth daily.  Marland Kitchen ezetimibe (ZETIA) 10 MG tablet TAKE 1 TABLET BY MOUTH EVERY DAY  . gabapentin (NEURONTIN) 300 MG capsule TAKE 1 CAPSULE BY MOUTH THREE TIMES A DAY  . glipiZIDE (GLUCOTROL XL) 5 MG 24 hr tablet TAKE 1 TABLET DAILY  . lisinopril (PRINIVIL,ZESTRIL) 10 MG tablet TAKE 1 TABLET DAILY  . metFORMIN (GLUCOPHAGE) 500 MG tablet TAKE 2 TABLETS TWICE A DAY  . Multiple Vitamin (MULTIVITAMIN) tablet Take 1 tablet by mouth daily.  Marland Kitchen omeprazole (PRILOSEC) 20 MG capsule TAKE 1 CAPSULE DAILY   No facility-administered encounter medications on file as of 06/13/2018.     Activities of Daily Living In your present state of health, do you have any difficulty performing the following activities: 06/13/2018  Hearing? N  Vision? N  Difficulty concentrating or making decisions? N  Walking or climbing stairs? N  Dressing or bathing? N  Doing errands, shopping? N  Preparing Food and eating ? N  Using the Toilet? N  In the past six months, have you accidently leaked urine? N  Do you have problems with loss of bowel control? N  Managing your Medications? N  Managing your Finances? N  Housekeeping or managing your Housekeeping? N  Some recent data might be hidden    Patient Care Team: Einar Pheasant, MD as PCP - General (Internal Medicine)    Assessment:   This is a routine wellness examination for Wanda Ruiz.   I connected with patient  06/13/18 at 10:30 AM EDT by a video/audio enabled telemedicine application and verified that I am speaking with the correct person using two identifiers. Patient stated full name and DOB. Patient gave permission to continue with virtual visit. Patient's location was at home and Nurse's location was at [place] office.   Health Screenings  Mammogram - 04/2017; new ordered 01/2018 Colonoscopy - 11/2017 Bone Density - 10/2014 Glaucoma -none Hearing -demonstrates normal hearing during visit. Hemoglobin  A1C - 03/2018 Cholesterol - 03/2018 Dental- UTD Vision- visits within the last 12 months.  Social  Alcohol intake - no     Smoking history- never   Smokers in home? none Illicit drug use? none Exercise - some walking Diet - low carb Sexually Active -not currently BMI- discussed the importance of a healthy diet, water intake and the benefits of aerobic exercise.  Educational material provided.   Safety  Patient feels safe at home- yes Patient does have smoke detectors at home- yes Patient does wear sunscreen or protective clothing when in direct sunlight -yes Patient does wear seat belt when in a moving vehicle -yes Patient drives- yes  [id number] precautions and sickness symptoms discussed.   Activities of Daily Living Patient denies needing assistance with: driving, household chores, feeding themselves, getting from bed to chair, getting to the toilet, bathing/showering, dressing, managing money, or preparing meals.  No new identified risk were noted.    Depression Screen Patient's daughter passed away several months ago and has a blue day every once in awhile. She declines further intervention at this time and agrees to follow up with her pcp as needed.   Medication-taking as directed and without issues.   Fall Screen Patient denies being afraid of falling or falling in the last year.   Memory Screen Patient is alert.  Patient denies difficulty focusing, concentrating or misplacing  items. Correctly identified the president of the [place], season and recall. Patient likes to read, plays computer games, and/or work puzzles for brain stimulation.  Immunizations The following Immunizations were discussed: Influenza, shingles, pneumonia, and tetanus.   Other Providers Patient Care Team: Einar Pheasant, MD as PCP - General (Internal Medicine)  Exercise Activities and Dietary recommendations Type of exercise: walking, Intensity: Mild  Goals      Patient Stated   . Increase physical activity (pt-stated)     Walk more for exercise       Fall Risk Fall Risk  06/13/2018 08/10/2017 06/11/2017 06/21/2016 06/09/2016  Falls in the past year? 0 No No No No   Depression Screen PHQ 2/9 Scores 06/13/2018 08/10/2017 06/11/2017 06/21/2016  PHQ - 2 Score 1 0 0 0  PHQ- 9 Score - - - -     Cognitive Function MMSE - Mini Mental State Exam 06/09/2016  Orientation to time 5  Orientation to Place 5  Registration 3  Attention/ Calculation 5  Recall 3  Language- name 2 objects 2  Language- repeat 1  Language- follow 3 step command 3  Language- read & follow direction 1  Write a sentence 1  Copy design 1  Total score 30     6CIT Screen 06/13/2018 06/11/2017  What Year? 0 points 0 points  What month? 0 points 0 points  What time? 0 points 0 points  Count back from 20 0 points 0 points  Months in reverse 0 points 0 points  Repeat phrase 0 points 0 points  Total Score 0 0    Immunization History  Administered Date(s) Administered  . Influenza, High Dose Seasonal PF 10/22/2015, 09/19/2016, 11/22/2017  . Influenza,inj,Quad PF,6+ Mos 10/13/2013, 10/15/2014  . Pneumococcal Conjugate-13 02/13/2014  . Pneumococcal Polysaccharide-23 01/25/2017   Screening Tests Health Maintenance  Topic Date Due  . TETANUS/TDAP  04/06/1954  . MAMMOGRAM  04/24/2018  . INFLUENZA VACCINE  08/10/2018  . HEMOGLOBIN A1C  09/27/2018  . FOOT EXAM  11/23/2018  . OPHTHALMOLOGY EXAM  02/06/2019  . DEXA SCAN   Completed  . PNA  vac Low Risk Adult  Completed      Plan:    End of life planning; Advance aging; Advanced directives discussed.  Copy of current HCPOA/Living Will requested.    I have personally reviewed and noted the following in the patient's chart:   . Medical and social history . Use of alcohol, tobacco or illicit drugs  . Current medications and supplements . Functional ability and status . Nutritional status . Physical activity . Advanced directives . List of other physicians . Hospitalizations, surgeries, and ER visits in previous 12 months . Vitals . Screenings to include cognitive, depression, and falls . Referrals and appointments  In addition, I have reviewed and discussed with patient certain preventive protocols, quality metrics, and best practice recommendations. A written personalized care plan for preventive services as well as general preventive health recommendations were provided to patient.     Varney Biles, LPN  09/18/3714   Reviewed above information.  Agree with assessment and plan.    Dr Nicki Reaper

## 2018-06-13 NOTE — Patient Instructions (Addendum)
  Ms. Settle , Thank you for taking time to come for your Medicare Wellness Visit. I appreciate your ongoing commitment to your health goals. Please review the following plan we discussed and let me know if I can assist you in the future.   These are the goals we discussed: Goals      Patient Stated   . Increase physical activity (pt-stated)     Walk more for exercise       This is a list of the screening recommended for you and due dates:  Health Maintenance  Topic Date Due  . Tetanus Vaccine  [birth year]06/1954  . Mammogram  04/24/2018  . Flu Shot  08/10/2018  . Hemoglobin A1C  09/27/2018  . Complete foot exam   11/23/2018  . Eye exam for diabetics  02/06/2019  . DEXA scan (bone density measurement)  Completed  . Pneumonia vaccines  Completed

## 2018-06-13 NOTE — Progress Notes (Signed)
[name], female   DOB: Aug 11, 1935, 83 y.o.   MRN: [id number]   Virtual Visit via telephone Note  This visit type was conducted due to national recommendations for restrictions regarding the COVID-19 pandemic (e.g. social distancing).  This format is felt to be most appropriate for this patient at this time.  All issues noted in this document were discussed and addressed.  No physical exam was performed (except for noted visual exam findings with Video Visits).   I connected with Wanda Ruiz by telephone and verified that I am speaking with the correct person using two identifiers. Location patient: home Location provider: work  Persons participating in the telephone visit: patient, provider  I discussed the limitations, risks, security and privacy concerns of performing an evaluation and management service by telephone and the availability of in person appointments.  The patient expressed understanding and agreed to proceed.   Reason for visit:  Scheduled follow up.   HPI: Overall she feels she is doing relatively well.  Still coping with her daughter's death.  Overall she feels she is doing ok.  States her blood sugars have been doing well.  AM sugars averaging 90- low 100s.  Last a1c 7.5.  She is trying to watch her diet.  No chest pain.  No sob.  Tries to stay active.  No acid reflux.  No abdominal pain.  Bowels moving.  NCS - polyneuropathy.  On gabapentin.  Stable.  Tolerating zetia.  Scheduled for mammogram 07/10/18.     ROS: See pertinent positives and negatives per HPI.  Past Medical History:  Diagnosis Date  . Anemia   . Diabetes mellitus ([place])   . GERD (gastroesophageal reflux disease)   . Hypercholesterolemia   . Hypertension   . Hypothyroidism   . Osteoporosis     Past Surgical History:  Procedure Laterality Date  . COLONOSCOPY    . COLONOSCOPY WITH PROPOFOL N/A 11/09/2017   Procedure: COLONOSCOPY WITH PROPOFOL;  Surgeon: Lollie Sails, MD;   Location: [facility] ENDOSCOPY;  Service: Endoscopy;  Laterality: N/A;  . CYST EXCISION     low back  . ESOPHAGOGASTRODUODENOSCOPY (EGD) WITH PROPOFOL N/A 11/09/2017   Procedure: ESOPHAGOGASTRODUODENOSCOPY (EGD) WITH PROPOFOL;  Surgeon: Lollie Sails, MD;  Location: [facility] ENDOSCOPY;  Service: Endoscopy;  Laterality: N/A;  . THYROID SURGERY  1992   left hemi-thyroidectomy (colloid goiter0  . UPPER GASTROINTESTINAL ENDOSCOPY      Family History  Problem Relation Age of Onset  . Emphysema Father   . Heart disease Mother        myocardial infarction (69)  . Hypertension Mother   . Diabetes Mother   . Heart disease Brother        drug use  . Diabetes Mellitus II Sister        x3  . Hypertension Sister   . Breast cancer Other        niece  . Breast cancer Sister   . Cancer Sister   . Colon cancer Neg Hx     SOCIAL HX: reviewed.    Current Outpatient Medications:  .  ACCU-CHEK AVIVA PLUS test strip, CHECK BLOOD SUGAR TWICE A DAY, Disp: 200 each, Rfl: 12 .  ACCU-CHEK SOFTCLIX LANCETS lancets, CHECK BLOOD SUGAR ONCE A DAY DX E11.9, Disp: , Rfl: 11 .  albuterol (PROVENTIL HFA;VENTOLIN HFA) 108 (90 Base) MCG/ACT inhaler, Inhale 2 puffs into the lungs every 6 (six) hours as needed for wheezing or shortness of breath., Disp: , Rfl:  .  aspirin 81 MG tablet, Take 81 mg by mouth daily., Disp: , Rfl:  .  calcium-vitamin D (OSCAL WITH D) 250-125 MG-UNIT tablet, Take 1 tablet by mouth daily., Disp: , Rfl:  .  ezetimibe (ZETIA) 10 MG tablet, TAKE 1 TABLET BY MOUTH EVERY DAY, Disp: 90 tablet, Rfl: 0 .  gabapentin (NEURONTIN) 300 MG capsule, TAKE 1 CAPSULE BY MOUTH THREE TIMES A DAY, Disp: 270 capsule, Rfl: 1 .  glipiZIDE (GLUCOTROL XL) 5 MG 24 hr tablet, TAKE 1 TABLET DAILY, Disp: 90 tablet, Rfl: 3 .  lisinopril (PRINIVIL,ZESTRIL) 10 MG tablet, TAKE 1 TABLET DAILY, Disp: 90 tablet, Rfl: 4 .  metFORMIN (GLUCOPHAGE) 500 MG tablet, TAKE 2 TABLETS TWICE A DAY, Disp: 360 tablet, Rfl: 3 .  Multiple  Vitamin (MULTIVITAMIN) tablet, Take 1 tablet by mouth daily., Disp: , Rfl:  .  omeprazole (PRILOSEC) 20 MG capsule, TAKE 1 CAPSULE DAILY, Disp: 90 capsule, Rfl: 3  EXAM:  GENERAL: alert.  Answering questions appropriately.  Sounds to be in no acute distress.    PSYCH/NEURO: pleasant and cooperative, no obvious depression or anxiety, speech and thought processing grossly intact  ASSESSMENT AND PLAN:  Discussed the following assessment and plan:  Breast cancer screening - Scheduled for screening mammogram 07/10/18.    Anemia, unspecified type  Type 2 diabetes mellitus without complication, without long-term current use of insulin (HCC)  Hypercholesterolemia  Essential hypertension  Numbness and tingling  Anemia Has been evaluated by GI.  Recheck iron with next labs.    Diabetes mellitus Low carb diet and exercise.  Follow met b and a1c.  Sugars as outlined.    Hypercholesterolemia On zetia.  Low cholesterol diet and exercise.  Follow lipid panel.    Hypertension Blood pressure doing well.  Continue current medication regimen.  Follow pressures.  Follow metabolic panel.   Numbness and tingling Has been worked up by neurology.  Had MRI.  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  On gabapentin.      I discussed the assessment and treatment plan with the patient. The patient was provided an opportunity to ask questions and all were answered. The patient agreed with the plan and demonstrated an understanding of the instructions.   The patient was advised to call back or seek an in-person evaluation if the symptoms worsen or if the condition fails to improve as anticipated.  I provided 16 minutes of non-face-to-face time during this encounter.   Einar Pheasant, MD

## 2018-06-16 ENCOUNTER — Encounter: Payer: Self-pay | Admitting: Internal Medicine

## 2018-06-16 NOTE — Assessment & Plan Note (Signed)
Has been evaluated by GI.  Recheck iron with next labs.

## 2018-06-16 NOTE — Assessment & Plan Note (Signed)
Has been worked up by neurology.  Had MRI.  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  On gabapentin.

## 2018-06-16 NOTE — Assessment & Plan Note (Signed)
Low carb diet and exercise.  Follow met b and a1c.  Sugars as outlined.   

## 2018-06-16 NOTE — Assessment & Plan Note (Signed)
On zetia.  Low cholesterol diet and exercise.  Follow lipid panel.  

## 2018-06-16 NOTE — Assessment & Plan Note (Signed)
Blood pressure doing well.  Continue current medication regimen.  Follow pressures.  Follow metabolic panel.  

## 2018-06-20 ENCOUNTER — Other Ambulatory Visit: Payer: Self-pay | Admitting: Internal Medicine

## 2018-06-27 ENCOUNTER — Telehealth: Payer: Self-pay

## 2018-06-27 ENCOUNTER — Telehealth: Payer: Self-pay | Admitting: Internal Medicine

## 2018-06-27 MED ORDER — OMEPRAZOLE 20 MG PO CPDR: 20.0000 mg | DELAYED_RELEASE_CAPSULE | Freq: Every day | ORAL | 3 refills | Status: DC

## 2018-06-27 NOTE — Telephone Encounter (Signed)
Medication Refill - Medication: omeprazole (PRILOSEC) 20 MG capsule  Has the patient contacted their pharmacy? Yes (Agent: If no, request that the patient contact the pharmacy for the refill.) (Agent: If yes, when and what did the pharmacy advise?)Contact Pcp  Preferred Pharmacy (with phone number or street name):  [place], [place] [phone] (Phone) [phone] (Fax)     Agent: Please be advised that RX refills may take up to 3 business days. We ask that you follow-up with your pharmacy.

## 2018-06-27 NOTE — Telephone Encounter (Signed)
Reception to sent [facility].

## 2018-07-10 ENCOUNTER — Other Ambulatory Visit: Payer: Self-pay

## 2018-07-10 ENCOUNTER — Ambulatory Visit
Admission: RE | Admit: 2018-07-10 | Discharge: 2018-07-10 | Disposition: A | Discharge status: Home / Self Care | Payer: Medicare Other | Source: Ambulatory Visit | Attending: Internal Medicine | Admitting: Internal Medicine

## 2018-07-10 DIAGNOSIS — Z1231 Encounter for screening mammogram for malignant neoplasm of breast: Secondary | ICD-10-CM

## 2018-07-10 LAB — HM MAMMOGRAPHY

## 2018-07-15 ENCOUNTER — Other Ambulatory Visit: Payer: Self-pay

## 2018-07-15 MED ORDER — OMEPRAZOLE 20 MG PO CPDR: 20.0000 mg | DELAYED_RELEASE_CAPSULE | Freq: Every day | ORAL | 3 refills | Status: DC

## 2018-07-15 NOTE — Telephone Encounter (Signed)
I have called patient & [facility] to [facility].

## 2018-07-15 NOTE — Telephone Encounter (Signed)
Patient says she never received omeprazole when it was approved by Judson Roch on 06/27/2018. Please advise. She says they told her she needs a new script.

## 2018-08-09 ENCOUNTER — Other Ambulatory Visit: Payer: Self-pay

## 2018-08-09 ENCOUNTER — Other Ambulatory Visit (INDEPENDENT_AMBULATORY_CARE_PROVIDER_SITE_OTHER): Payer: Medicare Other

## 2018-08-09 DIAGNOSIS — D649 Anemia, unspecified: Secondary | ICD-10-CM | POA: Diagnosis not present

## 2018-08-09 DIAGNOSIS — E119 Type 2 diabetes mellitus without complications: Secondary | ICD-10-CM

## 2018-08-09 DIAGNOSIS — E7800 Pure hypercholesterolemia, unspecified: Secondary | ICD-10-CM | POA: Diagnosis not present

## 2018-08-09 LAB — HEMOGLOBIN A1C: Hgb A1c MFr Bld: 7 % — ABNORMAL HIGH (ref 4.6–6.5)

## 2018-08-09 LAB — CBC WITH DIFFERENTIAL/PLATELET
Basophils Absolute: 0.1 10*3/uL (ref 0.0–0.1)
Basophils Relative: 1.2 % (ref 0.0–3.0)
Eosinophils Absolute: 0.1 10*3/uL (ref 0.0–0.7)
Eosinophils Relative: 1.4 % (ref 0.0–5.0)
HCT: 33.7 % — ABNORMAL LOW (ref 36.0–46.0)
Hemoglobin: 10.6 g/dL — ABNORMAL LOW (ref 12.0–15.0)
Lymphocytes Relative: 23.1 % (ref 12.0–46.0)
Lymphs Abs: 1.9 10*3/uL (ref 0.7–4.0)
MCHC: 31.3 g/dL (ref 30.0–36.0)
MCV: 79.3 fl (ref 78.0–100.0)
Monocytes Absolute: 0.7 10*3/uL (ref 0.1–1.0)
Monocytes Relative: 8.1 % (ref 3.0–12.0)
Neutro Abs: 5.4 10*3/uL (ref 1.4–7.7)
Neutrophils Relative %: 66.2 % (ref 43.0–77.0)
Platelets: 260 10*3/uL (ref 150.0–400.0)
RBC: 4.26 Mil/uL (ref 3.87–5.11)
RDW: 17.1 % — ABNORMAL HIGH (ref 11.5–15.5)
WBC: 8.2 10*3/uL (ref 4.0–10.5)

## 2018-08-09 LAB — LIPID PANEL
Cholesterol: 180 mg/dL (ref 0–200)
HDL: 47.7 mg/dL (ref >39.00)
LDL Cholesterol: 113 mg/dL — ABNORMAL HIGH (ref 0–99)
NonHDL: 132.13
Total CHOL/HDL Ratio: 4
Triglycerides: 97 mg/dL (ref 0.0–149.0)
VLDL: 19.4 mg/dL (ref 0.0–40.0)

## 2018-08-09 LAB — HEPATIC FUNCTION PANEL
ALT: 9 U/L (ref 0–35)
AST: 11 U/L (ref 0–37)
Albumin: 4.1 g/dL (ref 3.5–5.2)
Alkaline Phosphatase: 40 U/L (ref 39–117)
Bilirubin, Direct: 0.1 mg/dL (ref 0.0–0.3)
Total Bilirubin: 0.4 mg/dL (ref 0.2–1.2)
Total Protein: 6.8 g/dL (ref 6.0–8.3)

## 2018-08-09 LAB — BASIC METABOLIC PANEL
BUN: 20 mg/dL (ref 6–23)
CO2: 26 mEq/L (ref 19–32)
Calcium: 9.6 mg/dL (ref 8.4–10.5)
Chloride: 106 mEq/L (ref 96–112)
Creatinine, Ser: 0.86 mg/dL (ref 0.40–1.20)
GFR: 76.56 mL/min (ref >60.00)
Glucose, Bld: 127 mg/dL — ABNORMAL HIGH (ref 70–99)
Potassium: 4.4 mEq/L (ref 3.5–5.1)
Sodium: 140 mEq/L (ref 135–145)

## 2018-08-09 LAB — FERRITIN: Ferritin: 119.8 ng/mL (ref 10.0–291.0)

## 2018-08-14 ENCOUNTER — Ambulatory Visit: Payer: Medicare Other | Admitting: Internal Medicine

## 2018-08-15 ENCOUNTER — Other Ambulatory Visit: Payer: Self-pay | Admitting: Internal Medicine

## 2018-08-15 DIAGNOSIS — D649 Anemia, unspecified: Secondary | ICD-10-CM

## 2018-08-15 NOTE — Progress Notes (Signed)
Orders placed for f/u labs.  

## 2018-09-03 ENCOUNTER — Telehealth: Payer: Self-pay

## 2018-09-03 NOTE — Telephone Encounter (Signed)
Patient scheduled appt for flu shot on 09/10/18 @ 3:45 pm.

## 2018-09-03 NOTE — Telephone Encounter (Signed)
Copied from [place] [phone]. Topic: General - Other >> Sep 03, 2018 10:10 AM Leward Quan A wrote: Reason for CRM: Patient called to say that she have a 2.00 pm appointment fro labs on 09/10/2018 and would like a call back to let her know if she can come in for the flu shot on that day before or after her labs. Please call patient at Ph# [phone]

## 2018-09-05 ENCOUNTER — Other Ambulatory Visit: Payer: Self-pay | Admitting: Internal Medicine

## 2018-09-10 ENCOUNTER — Ambulatory Visit (INDEPENDENT_AMBULATORY_CARE_PROVIDER_SITE_OTHER): Payer: Medicare Other

## 2018-09-10 ENCOUNTER — Other Ambulatory Visit (INDEPENDENT_AMBULATORY_CARE_PROVIDER_SITE_OTHER): Payer: Medicare Other

## 2018-09-10 ENCOUNTER — Other Ambulatory Visit: Payer: Self-pay

## 2018-09-10 DIAGNOSIS — Z23 Encounter for immunization: Secondary | ICD-10-CM | POA: Diagnosis not present

## 2018-09-10 DIAGNOSIS — D649 Anemia, unspecified: Secondary | ICD-10-CM | POA: Diagnosis not present

## 2018-09-10 LAB — CBC WITH DIFFERENTIAL/PLATELET
Basophils Absolute: 0.1 10*3/uL (ref 0.0–0.1)
Basophils Relative: 0.9 % (ref 0.0–3.0)
Eosinophils Absolute: 0.1 10*3/uL (ref 0.0–0.7)
Eosinophils Relative: 1.3 % (ref 0.0–5.0)
HCT: 36.2 % (ref 36.0–46.0)
Hemoglobin: 11.4 g/dL — ABNORMAL LOW (ref 12.0–15.0)
Lymphocytes Relative: 26.8 % (ref 12.0–46.0)
Lymphs Abs: 2.4 10*3/uL (ref 0.7–4.0)
MCHC: 31.5 g/dL (ref 30.0–36.0)
MCV: 79.3 fl (ref 78.0–100.0)
Monocytes Absolute: 0.8 10*3/uL (ref 0.1–1.0)
Monocytes Relative: 8.4 % (ref 3.0–12.0)
Neutro Abs: 5.6 10*3/uL (ref 1.4–7.7)
Neutrophils Relative %: 62.6 % (ref 43.0–77.0)
Platelets: 283 10*3/uL (ref 150.0–400.0)
RBC: 4.57 Mil/uL (ref 3.87–5.11)
RDW: 16.5 % — ABNORMAL HIGH (ref 11.5–15.5)
WBC: 8.9 10*3/uL (ref 4.0–10.5)

## 2018-09-10 LAB — IBC + FERRITIN
Ferritin: 154.2 ng/mL (ref 10.0–291.0)
Iron: 51 ug/dL (ref 42–145)
Saturation Ratios: 16.9 % — ABNORMAL LOW (ref 20.0–50.0)
Transferrin: 216 mg/dL (ref 212.0–360.0)

## 2018-09-10 LAB — VITAMIN B12: Vitamin B-12: 408 pg/mL (ref 211–911)

## 2018-09-11 ENCOUNTER — Encounter: Payer: Self-pay | Admitting: Internal Medicine

## 2018-09-11 ENCOUNTER — Other Ambulatory Visit: Payer: Self-pay | Admitting: Internal Medicine

## 2018-09-11 LAB — FOLATE RBC: RBC Folate: 685 ng/mL RBC (ref >280)

## 2018-10-01 ENCOUNTER — Other Ambulatory Visit: Payer: Self-pay | Admitting: Internal Medicine

## 2018-10-18 ENCOUNTER — Encounter: Payer: Medicare Other | Admitting: Internal Medicine

## 2018-10-21 ENCOUNTER — Other Ambulatory Visit: Payer: Self-pay

## 2018-10-21 ENCOUNTER — Ambulatory Visit (INDEPENDENT_AMBULATORY_CARE_PROVIDER_SITE_OTHER): Payer: Medicare Other | Admitting: Internal Medicine

## 2018-10-21 VITALS — BP 122/78 | HR 94 | Temp 96.5°F | Resp 16 | Ht 62.0 in | Wt 142.4 lb

## 2018-10-21 DIAGNOSIS — E7800 Pure hypercholesterolemia, unspecified: Secondary | ICD-10-CM | POA: Diagnosis not present

## 2018-10-21 DIAGNOSIS — Z0000 Encounter for general adult medical examination without abnormal findings: Secondary | ICD-10-CM | POA: Diagnosis not present

## 2018-10-21 DIAGNOSIS — F439 Reaction to severe stress, unspecified: Secondary | ICD-10-CM

## 2018-10-21 DIAGNOSIS — I10 Essential (primary) hypertension: Secondary | ICD-10-CM

## 2018-10-21 DIAGNOSIS — D649 Anemia, unspecified: Secondary | ICD-10-CM | POA: Diagnosis not present

## 2018-10-21 DIAGNOSIS — E1165 Type 2 diabetes mellitus with hyperglycemia: Secondary | ICD-10-CM

## 2018-10-21 LAB — HM DIABETES FOOT EXAM

## 2018-10-21 NOTE — Assessment & Plan Note (Signed)
Physical today 10/21/18.  Colonoscopy 11/09/17.  mammmogram 07/10/18 - Birads I.

## 2018-10-21 NOTE — Patient Instructions (Signed)
Blood sugar:  Goal <120 in am and <140 2 hours after eating.    Pulse 60-90  Blood pressure:  <135/85

## 2018-10-21 NOTE — Progress Notes (Signed)
[name], female   DOB: 1935/10/01, 83 y.o.   MRN: [id number]   Subjective:    [name], female    DOB: 28-Feb-1935, 83 y.o.   MRN: [id number]  HPI  Patient here for a physical exam.  Increased stress.  Still trying to cope with her daughter's death.  Also recently brother-n-law (x2) passed away.  Discussed wit her today.  She feels she is handling things relatively well.  Does not feel needs any further intervention.  Has good support.  Trying to stay active. No chest pain. No sob. No acid reflux.  No abdominal pain.  Bowels moving.  Taking zetia.  Tolerating.  Recently saw neurology.  NCS - polyneuropathy.  On gabapentin. Discussed diet and exercise.  Brought in no recorded sugar readings.  Last a1c 7.0.     Past Medical History:  Diagnosis Date  . Anemia   . Diabetes mellitus ([place])   . GERD (gastroesophageal reflux disease)   . Hypercholesterolemia   . Hypertension   . Hypothyroidism   . Osteoporosis    Past Surgical History:  Procedure Laterality Date  . COLONOSCOPY    . COLONOSCOPY WITH PROPOFOL N/A 11/09/2017   Procedure: COLONOSCOPY WITH PROPOFOL;  Surgeon: Lollie Sails, MD;  Location: [facility] ENDOSCOPY;  Service: Endoscopy;  Laterality: N/A;  . CYST EXCISION     low back  . ESOPHAGOGASTRODUODENOSCOPY (EGD) WITH PROPOFOL N/A 11/09/2017   Procedure: ESOPHAGOGASTRODUODENOSCOPY (EGD) WITH PROPOFOL;  Surgeon: Lollie Sails, MD;  Location: [facility] ENDOSCOPY;  Service: Endoscopy;  Laterality: N/A;  . THYROID SURGERY  1992   left hemi-thyroidectomy (colloid goiter0  . UPPER GASTROINTESTINAL ENDOSCOPY     Family History  Problem Relation Age of Onset  . Emphysema Father   . Heart disease Mother        myocardial infarction (69)  . Hypertension Mother   . Diabetes Mother   . Heart disease Brother        drug use  . Diabetes Mellitus II Sister        x3  . Hypertension Sister   . Breast cancer Other        niece  . Breast cancer Sister    . Cancer Sister   . Colon cancer Neg Hx    Social History   Socioeconomic History  . Marital status: Married    Spouse name: Not on file  . Number of children: 2  . Years of education: Not on file  . Highest education level: Not on file  Occupational History  . Not on file  Social Needs  . Financial resource strain: Not hard at all  . Food insecurity    Worry: Never true    Inability: Never true  . Transportation needs    Medical: No    Non-medical: No  Tobacco Use  . Smoking status: Never Smoker  . Smokeless tobacco: Never Used  Substance and Sexual Activity  . Alcohol use: No    Alcohol/week: 0.0 standard drinks    Comment: occasional  . Drug use: Never  . Sexual activity: Not on file  Lifestyle  . Physical activity    Days per week: 0 days    Minutes per session: Not on file  . Stress: Not on file  Relationships  . Social Herbalist on phone: Not on file    Gets together: Not on file    Attends religious service: Not on file    Active  member of club or organization: Not on file    Attends meetings of clubs or organizations: Not on file    Relationship status: Not on file  Other Topics Concern  . Not on file  Social History Narrative  . Not on file    Outpatient Encounter Medications as of 10/21/2018  Medication Sig  . ACCU-CHEK AVIVA PLUS test strip USE TO CHECK BLOOD SUGAR TWICE A DAY  . ACCU-CHEK SOFTCLIX LANCETS lancets CHECK BLOOD SUGAR ONCE A DAY DX E11.9  . albuterol (PROVENTIL HFA;VENTOLIN HFA) 108 (90 Base) MCG/ACT inhaler Inhale 2 puffs into the lungs every 6 (six) hours as needed for wheezing or shortness of breath.  Marland Kitchen aspirin 81 MG tablet Take 81 mg by mouth daily.  . calcium-vitamin D (OSCAL WITH D) 250-125 MG-UNIT tablet Take 1 tablet by mouth daily.  Marland Kitchen ezetimibe (ZETIA) 10 MG tablet TAKE 1 TABLET BY MOUTH EVERY DAY  . gabapentin (NEURONTIN) 300 MG capsule TAKE 1 CAPSULE BY MOUTH THREE TIMES A DAY  . glipiZIDE (GLUCOTROL XL) 5 MG  24 hr tablet TAKE 1 TABLET DAILY  . lisinopril (PRINIVIL,ZESTRIL) 10 MG tablet TAKE 1 TABLET DAILY  . metFORMIN (GLUCOPHAGE) 500 MG tablet TAKE 2 TABLETS TWICE A DAY  . Multiple Vitamin (MULTIVITAMIN) tablet Take 1 tablet by mouth daily.  Marland Kitchen omeprazole (PRILOSEC) 20 MG capsule Take 1 capsule (20 mg total) by mouth daily.   No facility-administered encounter medications on file as of 10/21/2018.     Review of Systems  Constitutional: Negative for appetite change and unexpected weight change.  HENT: Negative for congestion and sinus pressure.   Eyes: Negative for pain and visual disturbance.  Respiratory: Negative for cough, chest tightness and shortness of breath.   Cardiovascular: Negative for chest pain, palpitations and leg swelling.  Gastrointestinal: Negative for abdominal pain, diarrhea, nausea and vomiting.  Genitourinary: Negative for difficulty urinating and dysuria.  Musculoskeletal: Negative for joint swelling and myalgias.  Skin: Negative for color change and rash.  Neurological: Negative for dizziness, light-headedness and headaches.  Hematological: Negative for adenopathy. Does not bruise/bleed easily.  Psychiatric/Behavioral: Negative for agitation and dysphoric mood.       Increased stress as outlined.         Objective:    Physical Exam Constitutional:      General: She is not in acute distress.    Appearance: Normal appearance. She is well-developed.  HENT:     Head: Normocephalic and atraumatic.     Right Ear: External ear normal.     Left Ear: External ear normal.  Eyes:     General: No scleral icterus.       Right eye: No discharge.        Left eye: No discharge.     Conjunctiva/sclera: Conjunctivae normal.  Neck:     Musculoskeletal: Neck supple. No muscular tenderness.     Thyroid: No thyromegaly.  Cardiovascular:     Rate and Rhythm: Normal rate and regular rhythm.  Pulmonary:     Effort: No tachypnea, accessory muscle usage or respiratory  distress.     Breath sounds: Normal breath sounds. No decreased breath sounds or wheezing.  Chest:     Breasts:        Right: No inverted nipple, mass, nipple discharge or tenderness (no axillary adenopathy).        Left: No inverted nipple, mass, nipple discharge or tenderness (no axilarry adenopathy).  Abdominal:     General: Bowel sounds are normal.  Palpations: Abdomen is soft.     Tenderness: There is no abdominal tenderness.  Musculoskeletal:        General: No swelling or tenderness.  Lymphadenopathy:     Cervical: No cervical adenopathy.  Skin:    Findings: No erythema or rash.  Neurological:     Mental Status: She is alert and oriented to person, place, and time.  Psychiatric:        Mood and Affect: Mood normal.        Behavior: Behavior normal.     BP 122/78   Pulse 94   Temp (!) 96.5 F (35.8 C)   Resp 16   Ht 5' 2"  (1.575 m)   Wt 142 lb 6.4 oz (64.6 kg)   SpO2 97%   BMI 26.05 kg/m  Wt Readings from Last 3 Encounters:  10/21/18 142 lb 6.4 oz (64.6 kg)  03/29/18 140 lb 12.8 oz (63.9 kg)  11/22/17 145 lb 3.2 oz (65.9 kg)     Lab Results  Component Value Date   WBC 8.9 09/10/2018   HGB 11.4 (L) 09/10/2018   HCT 36.2 09/10/2018   PLT 283.0 09/10/2018   GLUCOSE 127 (H) 08/09/2018   CHOL 180 08/09/2018   TRIG 97.0 08/09/2018   HDL 47.70 08/09/2018   LDLCALC 113 (H) 08/09/2018   ALT 9 08/09/2018   AST 11 08/09/2018   NA 140 08/09/2018   K 4.4 08/09/2018   CL 106 08/09/2018   CREATININE 0.86 08/09/2018   BUN 20 08/09/2018   CO2 26 08/09/2018   TSH 0.44 11/20/2017   HGBA1C 7.0 (H) 08/09/2018   MICROALBUR 2.0 (H) 11/20/2017    Mm 3d Screen Breast Bilateral  Result Date: 07/10/2018 CLINICAL DATA:  Screening. EXAM: DIGITAL SCREENING BILATERAL MAMMOGRAM WITH TOMO AND CAD COMPARISON:  Previous exam(s). ACR Breast Density Category b: There are scattered areas of fibroglandular density. FINDINGS: There are no findings suspicious for malignancy. Images  were processed with CAD. IMPRESSION: No mammographic evidence of malignancy. A result letter of this screening mammogram will be mailed directly to the patient. RECOMMENDATION: Screening mammogram in one year. (Code:SM-B-01Y) BI-RADS CATEGORY  1: Negative. Electronically Signed   By: Margarette Canada M.D.   On: 07/10/2018 15:33       Assessment & Plan:   Problem List Items Addressed This Visit    Anemia    Has been evaluated by GI.  Recent EGD/colonoscopy.  Follow cbc.  hgb has been stable.       Relevant Orders   CBC with Differential/Platelet   Diabetes mellitus ([place])    Low carb diet and exercise.  Follow met b and a1c. On metformin.        Relevant Orders   Hemoglobin Y1O   Basic metabolic panel   Microalbumin / creatinine urine ratio   Health care maintenance    Physical today 10/21/18.  Colonoscopy 11/09/17.  mammmogram 07/10/18 - Birads I.       Hypercholesterolemia    On zetia.  Tolerating.  Low cholesterol diet and exercise.  Follow lipid panel.       Relevant Orders   Lipid panel   Hepatic function panel   Hypertension    Blood pressure under good control.  Continue same medication regimen.  Follow pressures.  Follow metabolic panel.        Relevant Orders   TSH   Stress    Increased stress as outlined.  Still trying to cope with her daughter's death.  Now  with a couple of other family members who have recently passed away.  Discussed with her today. She does not feel needs any further intervention.  Has good support.  Follow.         Other Visit Diagnoses    Routine general medical examination at a health care facility    -  Primary       Einar Pheasant, MD

## 2018-10-27 ENCOUNTER — Encounter: Payer: Self-pay | Admitting: Internal Medicine

## 2018-10-27 DIAGNOSIS — F439 Reaction to severe stress, unspecified: Secondary | ICD-10-CM | POA: Insufficient documentation

## 2018-10-27 NOTE — Assessment & Plan Note (Signed)
Low carb diet and exercise.  Follow met b and a1c. On metformin.

## 2018-10-27 NOTE — Assessment & Plan Note (Signed)
Increased stress as outlined.  Still trying to cope with her daughter's death.  Now with a couple of other family members who have recently passed away.  Discussed with her today. She does not feel needs any further intervention.  Has good support.  Follow.

## 2018-10-27 NOTE — Assessment & Plan Note (Signed)
Blood pressure under good control.  Continue same medication regimen.  Follow pressures.  Follow metabolic panel.   

## 2018-10-27 NOTE — Assessment & Plan Note (Signed)
Has been evaluated by GI.  Recent EGD/colonoscopy.  Follow cbc.  hgb has been stable.

## 2018-10-27 NOTE — Assessment & Plan Note (Signed)
On zetia.  Tolerating.  Low cholesterol diet and exercise.  Follow lipid panel.   

## 2018-12-24 ENCOUNTER — Other Ambulatory Visit: Payer: Self-pay | Admitting: Internal Medicine

## 2019-01-24 ENCOUNTER — Ambulatory Visit (INDEPENDENT_AMBULATORY_CARE_PROVIDER_SITE_OTHER): Payer: Medicare Other | Admitting: Internal Medicine

## 2019-01-24 DIAGNOSIS — I10 Essential (primary) hypertension: Secondary | ICD-10-CM

## 2019-01-24 DIAGNOSIS — E039 Hypothyroidism, unspecified: Secondary | ICD-10-CM | POA: Diagnosis not present

## 2019-01-24 DIAGNOSIS — D649 Anemia, unspecified: Secondary | ICD-10-CM | POA: Diagnosis not present

## 2019-01-24 DIAGNOSIS — E1165 Type 2 diabetes mellitus with hyperglycemia: Secondary | ICD-10-CM | POA: Diagnosis not present

## 2019-01-24 DIAGNOSIS — M79606 Pain in leg, unspecified: Secondary | ICD-10-CM

## 2019-01-24 DIAGNOSIS — F439 Reaction to severe stress, unspecified: Secondary | ICD-10-CM

## 2019-01-24 NOTE — Progress Notes (Signed)
[name], female   DOB: 12/13/1935, 84 y.o.   MRN: [id number]   Virtual Visit via video Note  This visit type was conducted due to national recommendations for restrictions regarding the COVID-19 pandemic (e.g. social distancing).  This format is felt to be most appropriate for this patient at this time.  All issues noted in this document were discussed and addressed.  No physical exam was performed (except for noted visual exam findings with Video Visits).   I connected with Tamre Bogle by a video enabled telemedicine application and verified that I am speaking with the correct person using two identifiers. Location patient: home Location provider: work Persons participating in the virtual visit: patient, provider  The limitations, risks, security and privacy concerns of performing an evaluation and management service by video and the availability of in person appointments have been discussed.  The patient expressed understanding and agreed to proceed.   Reason for visit: scheduled follow up.    HPI: She reports she is doing relatively well.  Handling stress. Has good support.  Off gabapentin.  Feels feet/legs doing better.  No chest pain or sob reported.  No acid reflux or abdominal pain reported.  Previous stomach pain - now better.  Tolerating zetia.  A couple of days ago -sneezing.  Took allergy pill.  Better now.  No cough or sob reported.  Did report left arm - burning sensation.  May occur 1-2x/week.  May last 5-6 seconds.  No arm weakness.  Wants to monitor.  Bowels ok.     ROS: See pertinent positives and negatives per HPI.  Past Medical History:  Diagnosis Date  . Anemia   . Diabetes mellitus ([place])   . GERD (gastroesophageal reflux disease)   . Hypercholesterolemia   . Hypertension   . Hypothyroidism   . Osteoporosis     Past Surgical History:  Procedure Laterality Date  . COLONOSCOPY    . COLONOSCOPY WITH PROPOFOL N/A 11/09/2017   Procedure:  COLONOSCOPY WITH PROPOFOL;  Surgeon: Lollie Sails, MD;  Location: [facility] ENDOSCOPY;  Service: Endoscopy;  Laterality: N/A;  . CYST EXCISION     low back  . ESOPHAGOGASTRODUODENOSCOPY (EGD) WITH PROPOFOL N/A 11/09/2017   Procedure: ESOPHAGOGASTRODUODENOSCOPY (EGD) WITH PROPOFOL;  Surgeon: Lollie Sails, MD;  Location: [facility] ENDOSCOPY;  Service: Endoscopy;  Laterality: N/A;  . THYROID SURGERY  1992   left hemi-thyroidectomy (colloid goiter0  . UPPER GASTROINTESTINAL ENDOSCOPY      Family History  Problem Relation Age of Onset  . Emphysema Father   . Heart disease Mother        myocardial infarction (69)  . Hypertension Mother   . Diabetes Mother   . Heart disease Brother        drug use  . Diabetes Mellitus II Sister        x3  . Hypertension Sister   . Breast cancer Other        niece  . Breast cancer Sister   . Cancer Sister   . Colon cancer Neg Hx     SOCIAL HX: reviewed.    Current Outpatient Medications:  .  ACCU-CHEK AVIVA PLUS test strip, USE TO CHECK BLOOD SUGAR TWICE A DAY, Disp: 200 strip, Rfl: 12 .  ACCU-CHEK SOFTCLIX LANCETS lancets, CHECK BLOOD SUGAR ONCE A DAY DX E11.9, Disp: , Rfl: 11 .  albuterol (PROVENTIL HFA;VENTOLIN HFA) 108 (90 Base) MCG/ACT inhaler, Inhale 2 puffs into the lungs every 6 (six) hours as needed  for wheezing or shortness of breath., Disp: , Rfl:  .  aspirin 81 MG tablet, Take 81 mg by mouth daily., Disp: , Rfl:  .  calcium-vitamin D (OSCAL WITH D) 250-125 MG-UNIT tablet, Take 1 tablet by mouth daily., Disp: , Rfl:  .  ezetimibe (ZETIA) 10 MG tablet, TAKE 1 TABLET BY MOUTH EVERY DAY, Disp: 90 tablet, Rfl: 3 .  gabapentin (NEURONTIN) 300 MG capsule, TAKE 1 CAPSULE BY MOUTH THREE TIMES A DAY, Disp: 270 capsule, Rfl: 1 .  glipiZIDE (GLUCOTROL XL) 5 MG 24 hr tablet, TAKE 1 TABLET DAILY, Disp: 90 tablet, Rfl: 3 .  lisinopril (PRINIVIL,ZESTRIL) 10 MG tablet, TAKE 1 TABLET DAILY, Disp: 90 tablet, Rfl: 4 .  metFORMIN (GLUCOPHAGE) 500 MG tablet,  TAKE 2 TABLETS TWICE A DAY, Disp: 360 tablet, Rfl: 3 .  Multiple Vitamin (MULTIVITAMIN) tablet, Take 1 tablet by mouth daily., Disp: , Rfl:  .  omeprazole (PRILOSEC) 20 MG capsule, Take 1 capsule (20 mg total) by mouth daily., Disp: 90 capsule, Rfl: 3  EXAM:  GENERAL: alert, oriented, appears well and in no acute distress  HEENT: atraumatic, conjunttiva clear, no obvious abnormalities on inspection of external nose and ears  NECK: normal movements of the head and neck  LUNGS: on inspection no signs of respiratory distress, breathing rate appears normal, no obvious gross SOB, gasping or wheezing  CV: no obvious cyanosis  PSYCH/NEURO: pleasant and cooperative, no obvious depression or anxiety, speech and thought processing grossly intact  ASSESSMENT AND PLAN:  Discussed the following assessment and plan:  Anemia Has been evaluated by GI.  Recent EGD/colonoscopy.  Follow cbc.   Diabetes mellitus On metformin.  Low carb diet and exercise.  Follow met b and a1c.   Hypertension Blood pressure has been under good control.  Continue current medication regimen.  Follow pressures.  Follow metabolic panel.   Hypothyroidism On thyroid replacement.  Follow tsh.   Leg pain Improved.  Off gabapentin.    Stress Increased stress. Overall handling things relatively well.  Has good support.  Follow.     I discussed the assessment and treatment plan with the patient. The patient was provided an opportunity to ask questions and all were answered. The patient agreed with the plan and demonstrated an understanding of the instructions.   The patient was advised to call back or seek an in-person evaluation if the symptoms worsen or if the condition fails to improve as anticipated.   Einar Pheasant, MD

## 2019-01-25 ENCOUNTER — Encounter: Payer: Self-pay | Admitting: Internal Medicine

## 2019-01-25 NOTE — Assessment & Plan Note (Signed)
Increased stress. Overall handling things relatively well.  Has good support.  Follow.

## 2019-01-25 NOTE — Assessment & Plan Note (Signed)
On metformin.  Low carb diet and exercise.  Follow met b and a1c.   

## 2019-01-25 NOTE — Assessment & Plan Note (Signed)
Has been evaluated by GI.  Recent EGD/colonoscopy.  Follow cbc.

## 2019-01-25 NOTE — Assessment & Plan Note (Signed)
Blood pressure has been under good control.  Continue current medication regimen.  Follow pressures.  Follow metabolic panel.  

## 2019-01-25 NOTE — Assessment & Plan Note (Signed)
Improved.  Off gabapentin.

## 2019-01-25 NOTE — Assessment & Plan Note (Signed)
On thyroid replacement.  Follow tsh.  

## 2019-02-12 ENCOUNTER — Other Ambulatory Visit: Payer: Medicare Other

## 2019-02-12 ENCOUNTER — Ambulatory Visit: Payer: Medicare Other

## 2019-02-13 LAB — HM DIABETES EYE EXAM

## 2019-02-14 ENCOUNTER — Other Ambulatory Visit: Payer: Self-pay

## 2019-02-14 ENCOUNTER — Other Ambulatory Visit (INDEPENDENT_AMBULATORY_CARE_PROVIDER_SITE_OTHER): Payer: Medicare Other

## 2019-02-14 DIAGNOSIS — E1165 Type 2 diabetes mellitus with hyperglycemia: Secondary | ICD-10-CM | POA: Diagnosis not present

## 2019-02-14 DIAGNOSIS — D649 Anemia, unspecified: Secondary | ICD-10-CM

## 2019-02-14 DIAGNOSIS — E7800 Pure hypercholesterolemia, unspecified: Secondary | ICD-10-CM

## 2019-02-14 DIAGNOSIS — I10 Essential (primary) hypertension: Secondary | ICD-10-CM

## 2019-02-14 LAB — CBC WITH DIFFERENTIAL/PLATELET
Basophils Absolute: 0.1 10*3/uL (ref 0.0–0.1)
Basophils Relative: 1 % (ref 0.0–3.0)
Eosinophils Absolute: 0.1 10*3/uL (ref 0.0–0.7)
Eosinophils Relative: 1.6 % (ref 0.0–5.0)
HCT: 34.6 % — ABNORMAL LOW (ref 36.0–46.0)
Hemoglobin: 10.9 g/dL — ABNORMAL LOW (ref 12.0–15.0)
Lymphocytes Relative: 24.6 % (ref 12.0–46.0)
Lymphs Abs: 2 10*3/uL (ref 0.7–4.0)
MCHC: 31.5 g/dL (ref 30.0–36.0)
MCV: 79.1 fl (ref 78.0–100.0)
Monocytes Absolute: 0.6 10*3/uL (ref 0.1–1.0)
Monocytes Relative: 7.1 % (ref 3.0–12.0)
Neutro Abs: 5.3 10*3/uL (ref 1.4–7.7)
Neutrophils Relative %: 65.7 % (ref 43.0–77.0)
Platelets: 277 10*3/uL (ref 150.0–400.0)
RBC: 4.37 Mil/uL (ref 3.87–5.11)
RDW: 16.1 % — ABNORMAL HIGH (ref 11.5–15.5)
WBC: 8.1 10*3/uL (ref 4.0–10.5)

## 2019-02-14 LAB — BASIC METABOLIC PANEL
BUN: 16 mg/dL (ref 6–23)
CO2: 26 mEq/L (ref 19–32)
Calcium: 9.5 mg/dL (ref 8.4–10.5)
Chloride: 104 mEq/L (ref 96–112)
Creatinine, Ser: 0.85 mg/dL (ref 0.40–1.20)
GFR: 77.5 mL/min (ref >60.00)
Glucose, Bld: 107 mg/dL — ABNORMAL HIGH (ref 70–99)
Potassium: 4.3 mEq/L (ref 3.5–5.1)
Sodium: 137 mEq/L (ref 135–145)

## 2019-02-14 LAB — LIPID PANEL
Cholesterol: 174 mg/dL (ref 0–200)
HDL: 44.6 mg/dL (ref >39.00)
LDL Cholesterol: 115 mg/dL — ABNORMAL HIGH (ref 0–99)
NonHDL: 129.8
Total CHOL/HDL Ratio: 4
Triglycerides: 76 mg/dL (ref 0.0–149.0)
VLDL: 15.2 mg/dL (ref 0.0–40.0)

## 2019-02-14 LAB — HEPATIC FUNCTION PANEL
ALT: 11 U/L (ref 0–35)
AST: 13 U/L (ref 0–37)
Albumin: 4.2 g/dL (ref 3.5–5.2)
Alkaline Phosphatase: 40 U/L (ref 39–117)
Bilirubin, Direct: 0.1 mg/dL (ref 0.0–0.3)
Total Bilirubin: 0.3 mg/dL (ref 0.2–1.2)
Total Protein: 7.4 g/dL (ref 6.0–8.3)

## 2019-02-14 LAB — MICROALBUMIN / CREATININE URINE RATIO
Creatinine,U: 133.6 mg/dL
Microalb Creat Ratio: 1.2 mg/g (ref 0.0–30.0)
Microalb, Ur: 1.6 mg/dL (ref 0.0–1.9)

## 2019-02-14 LAB — TSH: TSH: 0.73 u[IU]/mL (ref 0.35–4.50)

## 2019-02-14 LAB — HEMOGLOBIN A1C: Hgb A1c MFr Bld: 7.1 % — ABNORMAL HIGH (ref 4.6–6.5)

## 2019-02-20 ENCOUNTER — Other Ambulatory Visit: Payer: Self-pay | Admitting: Internal Medicine

## 2019-02-20 DIAGNOSIS — D649 Anemia, unspecified: Secondary | ICD-10-CM

## 2019-02-20 NOTE — Progress Notes (Signed)
Order placed for hematology referral.  

## 2019-02-21 ENCOUNTER — Other Ambulatory Visit: Payer: Self-pay

## 2019-02-24 ENCOUNTER — Encounter: Payer: Self-pay | Admitting: Internal Medicine

## 2019-02-24 ENCOUNTER — Other Ambulatory Visit: Payer: Self-pay

## 2019-02-24 ENCOUNTER — Inpatient Hospital Stay: Payer: Medicare Other | Attending: Internal Medicine | Admitting: Internal Medicine

## 2019-02-24 DIAGNOSIS — Z7982 Long term (current) use of aspirin: Secondary | ICD-10-CM | POA: Insufficient documentation

## 2019-02-24 DIAGNOSIS — Z7984 Long term (current) use of oral hypoglycemic drugs: Secondary | ICD-10-CM | POA: Diagnosis not present

## 2019-02-24 DIAGNOSIS — M818 Other osteoporosis without current pathological fracture: Secondary | ICD-10-CM

## 2019-02-24 DIAGNOSIS — D509 Iron deficiency anemia, unspecified: Secondary | ICD-10-CM | POA: Diagnosis present

## 2019-02-24 DIAGNOSIS — E119 Type 2 diabetes mellitus without complications: Secondary | ICD-10-CM | POA: Insufficient documentation

## 2019-02-24 DIAGNOSIS — E039 Hypothyroidism, unspecified: Secondary | ICD-10-CM | POA: Diagnosis not present

## 2019-02-24 DIAGNOSIS — Z79899 Other long term (current) drug therapy: Secondary | ICD-10-CM | POA: Insufficient documentation

## 2019-02-24 DIAGNOSIS — I10 Essential (primary) hypertension: Secondary | ICD-10-CM | POA: Diagnosis not present

## 2019-02-24 NOTE — Assessment & Plan Note (Addendum)
#  Chronic microcytic anemia-mild to moderate around 10-11; MCV 79; September 2020- iron studies-saturation 16 ferritin 150-no evidence of iron deficiency.  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  Discussed that if patient is interested would recommend further work-up including hemoglobinopathy evaluation-however this would not change her management at this time.  Recommend continued follow-up with PCP-however if hemoglobin goes less than 9-I think is reasonable to initiate further work-up.  #  DM- A1c- 7.1; no evidence of chronic kidney disease.-followed by PCP.  Thank you Dr. Nicki Reaper for allowing me to participate in the care of your pleasant patient. Please do not hesitate to contact me with questions or concerns in the interim.  # DISPOSITION:  # follow up as needed-Dr.B

## 2019-02-24 NOTE — Progress Notes (Signed)
[name] CONSULT NOTE  Patient Care Team: Einar Pheasant, MD as PCP - General (Internal Medicine)  CHIEF COMPLAINTS/PURPOSE OF CONSULTATION: anemia  HEMATOLOGY HISTORY  #CHRONIC MICROCYTIC ANEMIA-hemoglobin 10-11-MCV 79 [ EGD-2019- gastritis.  Colonoscopy-2019 redundant colon.  Non bleeding internal hemorrhoids; Dr. Gustavo Lah.  capsule-none September 2020 iron studies-saturation 16 ferritin 150.  #Diabetes-A1c 7.1   HISTORY OF PRESENTING ILLNESS:  Wanda Ruiz 84 y.o.  female has been referred to [place] for further evaluation/work-up for anemia.  Patient denies any worsening fatigue or chest pain or shortness of breath.  Chronic joint pains.  Not any worse.  Blood in stools: None Change in bowel habits- None Blood in urine: None Difficulty swallowing: None Abnormal weight loss: None Iron supplementation: yes.  Prior Blood transfusions: never Vaginal bleeding: None  Reviewed the records at length-office note/labs from patient's PCP Dr. Nicki Reaper; summarized above.    Review of Systems  Constitutional: Negative for chills, diaphoresis, fever, malaise/fatigue and weight loss.  HENT: Negative for nosebleeds and sore throat.   Eyes: Negative for double vision.  Respiratory: Negative for cough, hemoptysis, sputum production, shortness of breath and wheezing.   Cardiovascular: Negative for chest pain, palpitations, orthopnea and leg swelling.  Gastrointestinal: Negative for abdominal pain, blood in stool, constipation, diarrhea, heartburn, melena, nausea and vomiting.  Genitourinary: Negative for dysuria, frequency and urgency.  Musculoskeletal: Positive for back pain and joint pain.  Skin: Negative.  Negative for itching and rash.  Neurological: Negative for dizziness, tingling, focal weakness, weakness and headaches.  Endo/Heme/Allergies: Does not bruise/bleed easily.  Psychiatric/Behavioral: Negative for depression. The patient is not nervous/anxious and does not  have insomnia.     MEDICAL HISTORY:  Past Medical History:  Diagnosis Date  . Anemia   . Diabetes mellitus ([place])   . GERD (gastroesophageal reflux disease)   . Hypercholesterolemia   . Hypertension   . Hypothyroidism   . Osteoporosis     SURGICAL HISTORY: Past Surgical History:  Procedure Laterality Date  . COLONOSCOPY    . COLONOSCOPY WITH PROPOFOL N/A 11/09/2017   Procedure: COLONOSCOPY WITH PROPOFOL;  Surgeon: Lollie Sails, MD;  Location: [facility] ENDOSCOPY;  Service: Endoscopy;  Laterality: N/A;  . CYST EXCISION     low back  . ESOPHAGOGASTRODUODENOSCOPY (EGD) WITH PROPOFOL N/A 11/09/2017   Procedure: ESOPHAGOGASTRODUODENOSCOPY (EGD) WITH PROPOFOL;  Surgeon: Lollie Sails, MD;  Location: [facility] ENDOSCOPY;  Service: Endoscopy;  Laterality: N/A;  . THYROID SURGERY  1992   left hemi-thyroidectomy (colloid goiter0  . UPPER GASTROINTESTINAL ENDOSCOPY      SOCIAL HISTORY: Social History   Socioeconomic History  . Marital status: Married    Spouse name: Not on file  . Number of children: 2  . Years of education: Not on file  . Highest education level: Not on file  Occupational History  . Not on file  Tobacco Use  . Smoking status: Never Smoker  . Smokeless tobacco: Never Used  Substance and Sexual Activity  . Alcohol use: No    Alcohol/week: 0.0 standard drinks    Comment: occasional  . Drug use: Never  . Sexual activity: Not on file  Other Topics Concern  . Not on file  Social History Narrative   In [place]; with husband; never smoked;  no alcohol; telephone receptionist.    Social Determinants of Health   Financial Resource Strain:   . Difficulty of Paying Living Expenses: Not on file  Food Insecurity:   . Worried About Charity fundraiser in the  Last Year: Not on file  . Ran Out of Food in the Last Year: Not on file  Transportation Needs:   . Lack of Transportation (Medical): Not on file  . Lack of Transportation (Non-Medical): Not on file   Physical Activity: Unknown  . Days of Exercise per Week: 0 days  . Minutes of Exercise per Session: Not on file  Stress:   . Feeling of Stress : Not asked  Social Connections:   . Frequency of Communication with Friends and Family: Not on file  . Frequency of Social Gatherings with Friends and Family: Not on file  . Attends Religious Services: Not on file  . Active Member of Clubs or Organizations: Not on file  . Attends Archivist Meetings: Not on file  . Marital Status: Not on file  Intimate Partner Violence:   . Fear of Current or Ex-Partner: Not on file  . Emotionally Abused: Not on file  . Physically Abused: Not on file  . Sexually Abused: Not on file    FAMILY HISTORY: Family History  Problem Relation Age of Onset  . Emphysema Father   . Heart disease Mother        myocardial infarction (69)  . Hypertension Mother   . Diabetes Mother   . Heart disease Brother        drug use  . Diabetes Mellitus II Sister        x3  . Hypertension Sister   . Breast cancer Other        niece  . Breast cancer Sister   . Cancer Sister   . Colon cancer Neg Hx     ALLERGIES:  has No Known Allergies.  MEDICATIONS:  Current Outpatient Medications  Medication Sig Dispense Refill  . ACCU-CHEK AVIVA PLUS test strip USE TO CHECK BLOOD SUGAR TWICE A DAY 200 strip 12  . ACCU-CHEK SOFTCLIX LANCETS lancets CHECK BLOOD SUGAR ONCE A DAY DX E11.9  11  . aspirin 81 MG tablet Take 81 mg by mouth daily.    . calcium-vitamin D (OSCAL WITH D) 250-125 MG-UNIT tablet Take 1 tablet by mouth daily.    Marland Kitchen ezetimibe (ZETIA) 10 MG tablet TAKE 1 TABLET BY MOUTH EVERY DAY 90 tablet 3  . glipiZIDE (GLUCOTROL XL) 5 MG 24 hr tablet TAKE 1 TABLET DAILY 90 tablet 3  . lisinopril (PRINIVIL,ZESTRIL) 10 MG tablet TAKE 1 TABLET DAILY 90 tablet 4  . metFORMIN (GLUCOPHAGE) 500 MG tablet TAKE 2 TABLETS TWICE A DAY 360 tablet 3  . Multiple Vitamin (MULTIVITAMIN) tablet Take 1 tablet by mouth daily.    Marland Kitchen  omeprazole (PRILOSEC) 20 MG capsule Take 1 capsule (20 mg total) by mouth daily. 90 capsule 3  . albuterol (PROVENTIL HFA;VENTOLIN HFA) 108 (90 Base) MCG/ACT inhaler Inhale 2 puffs into the lungs every 6 (six) hours as needed for wheezing or shortness of breath.    . gabapentin (NEURONTIN) 300 MG capsule TAKE 1 CAPSULE BY MOUTH THREE TIMES A DAY (Patient not taking: Reported on 02/21/2019) 270 capsule 1   No current facility-administered medications for this visit.      PHYSICAL EXAMINATION:   Vitals:   02/24/19 1112  BP: (!) 140/93  Pulse: 85  Temp: (!) 97.2 F (36.2 C)   Filed Weights   02/24/19 1112  Weight: 141 lb 12.8 oz (64.3 kg)    Physical Exam  Constitutional: She is oriented to person, place, and time and well-developed, well-nourished, and in no distress.  HENT:  Head: Normocephalic and atraumatic.  Mouth/Throat: Oropharynx is clear and moist. No oropharyngeal exudate.  Eyes: Pupils are equal, round, and reactive to light.  Cardiovascular: Normal rate and regular rhythm.  Pulmonary/Chest: No respiratory distress. She has no wheezes.  Abdominal: Soft. Bowel sounds are normal. She exhibits no distension and no mass. There is no abdominal tenderness. There is no rebound and no guarding.  Musculoskeletal:        General: No tenderness or edema. Normal range of motion.     Cervical back: Normal range of motion and neck supple.  Neurological: She is alert and oriented to person, place, and time.  Skin: Skin is warm.  Psychiatric: Affect normal.    LABORATORY DATA:  I have reviewed the data as listed Lab Results  Component Value Date   WBC 8.1 02/14/2019   HGB 10.9 (L) 02/14/2019   HCT 34.6 (L) 02/14/2019   MCV 79.1 02/14/2019   PLT 277.0 02/14/2019   Recent Labs    03/27/18 0844 08/09/18 0827 02/14/19 0955  NA 136 140 137  K 4.7 4.4 4.3  CL 103 106 104  CO2 26 26 26   GLUCOSE 129* 127* 107*  BUN 17 20 16   CREATININE 0.78 0.86 0.85  CALCIUM 9.3 9.6 9.5   PROT 7.0 6.8 7.4  ALBUMIN 4.2 4.1 4.2  AST 10 11 13   ALT 10 9 11   ALKPHOS 41 40 40  BILITOT 0.6 0.4 0.3  BILIDIR 0.1 0.1 0.1     No results found.  Microcytic anemia #Chronic microcytic anemia-mild to moderate around 10-11; MCV 79; September 2020- iron studies-saturation 16 ferritin 150-no evidence of iron deficiency.  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  Discussed that if patient is interested would recommend further work-up including hemoglobinopathy evaluation-however this would not change her management at this time.  Recommend continued follow-up with PCP-however if hemoglobin goes less than 9-I think is reasonable to initiate further work-up.  #  DM- A1c- 7.1; no evidence of chronic kidney disease.-followed by PCP.  Thank you Dr. Nicki Reaper for allowing me to participate in the care of your pleasant patient. Please do not hesitate to contact me with questions or concerns in the interim.  # DISPOSITION:  # follow up as needed-Dr.B    All questions were answered. The patient knows to call the clinic with any problems, questions or concerns.      Cammie Sickle, MD 02/24/2019 11:47 AM

## 2019-04-04 ENCOUNTER — Other Ambulatory Visit: Payer: Self-pay

## 2019-04-04 ENCOUNTER — Ambulatory Visit: Payer: Medicare Other | Admitting: Internal Medicine

## 2019-04-04 ENCOUNTER — Telehealth: Payer: Self-pay | Admitting: Internal Medicine

## 2019-04-04 ENCOUNTER — Encounter: Payer: Self-pay | Admitting: Internal Medicine

## 2019-04-04 DIAGNOSIS — E7800 Pure hypercholesterolemia, unspecified: Secondary | ICD-10-CM

## 2019-04-04 DIAGNOSIS — M79606 Pain in leg, unspecified: Secondary | ICD-10-CM

## 2019-04-04 DIAGNOSIS — I10 Essential (primary) hypertension: Secondary | ICD-10-CM

## 2019-04-04 DIAGNOSIS — H6122 Impacted cerumen, left ear: Secondary | ICD-10-CM

## 2019-04-04 DIAGNOSIS — E1165 Type 2 diabetes mellitus with hyperglycemia: Secondary | ICD-10-CM

## 2019-04-04 DIAGNOSIS — F439 Reaction to severe stress, unspecified: Secondary | ICD-10-CM

## 2019-04-04 DIAGNOSIS — R202 Paresthesia of skin: Secondary | ICD-10-CM

## 2019-04-04 DIAGNOSIS — R200 Anesthesia of skin: Secondary | ICD-10-CM

## 2019-04-04 DIAGNOSIS — D509 Iron deficiency anemia, unspecified: Secondary | ICD-10-CM

## 2019-04-04 MED ORDER — CARBAMIDE PEROXIDE 6.5 % OT SOLN: 5.0000 [drp] | Freq: Every day | OTIC | 0 refills | Status: DC

## 2019-04-04 NOTE — Telephone Encounter (Signed)
Pharmacy does not have Debrox. Is there something else that can be used or can we put something in her ears to soften wax when she comes in for her nurse visit?

## 2019-04-04 NOTE — Telephone Encounter (Signed)
[facility] carbamide [facility] (DEBROX) 6.5 % OTIC solution till Monday and no other Pharmacy has it  Pt wanted to see if something else could be called in  Pt would like a call back

## 2019-04-04 NOTE — Telephone Encounter (Signed)
Left detailed message for patient.

## 2019-04-04 NOTE — Progress Notes (Signed)
[name], female   DOB: 1935-01-22, 84 y.o.   MRN: [id number]   Subjective:    [name], female    DOB: 1935/01/15, 84 y.o.   MRN: [id number]  HPI  Patient here for a scheduled follow up. She reports she is doing relatively well.  Handling stress relatively well.  States has good support.  Trying to stay active.  No chest pain or sob reported.  No abdominal pain or bowel change reported.  Does report some intermittent burning in her left arm and left leg.  Not constant.  No known triggers.  No weakness.  Has seen neurology previously.  Discussed further w/up and evaluation.  Wants to hold at this time.    Past Medical History:  Diagnosis Date  . Anemia   . Diabetes mellitus ([place])   . GERD (gastroesophageal reflux disease)   . Hypercholesterolemia   . Hypertension   . Hypothyroidism   . Osteoporosis    Past Surgical History:  Procedure Laterality Date  . COLONOSCOPY    . COLONOSCOPY WITH PROPOFOL N/A 11/09/2017   Procedure: COLONOSCOPY WITH PROPOFOL;  Surgeon: Lollie Sails, MD;  Location[facility] ENDOSCOPY;  Service: Endoscopy;  Laterality: N/A;  . CYST EXCISION     low back  . ESOPHAGOGASTRODUODENOSCOPY (EGD) WITH PROPOFOL N/A 11/09/2017   Procedure: ESOPHAGOGASTRODUODENOSCOPY (EGD) WITH PROPOFOL;  Surgeon: Lollie Sails, MD;  Location: [facility] ENDOSCOPY;  Service: Endoscopy;  Laterality: N/A;  . THYROID SURGERY  1992   left hemi-thyroidectomy (colloid goiter0  . UPPER GASTROINTESTINAL ENDOSCOPY     Family History  Problem Relation Age of Onset  . Emphysema Father   . Heart disease Mother        myocardial infarction (69)  . Hypertension Mother   . Diabetes Mother   . Heart disease Brother        drug use  . Diabetes Mellitus II Sister        x3  . Hypertension Sister   . Breast cancer Other        niece  . Breast cancer Sister   . Cancer Sister   . Colon cancer Neg Hx    Social History   Socioeconomic History  . Marital  status: Married    Spouse name: Not on file  . Number of children: 2  . Years of education: Not on file  . Highest education level: Not on file  Occupational History  . Not on file  Tobacco Use  . Smoking status: Never Smoker  . Smokeless tobacco: Never Used  Substance and Sexual Activity  . Alcohol use: No    Alcohol/week: 0.0 standard drinks    Comment: occasional  . Drug use: Never  . Sexual activity: Not on file  Other Topics Concern  . Not on file  Social History Narrative   In [place]; with husband; never smoked;  no alcohol; telephone receptionist.    Social Determinants of Health   Financial Resource Strain:   . Difficulty of Paying Living Expenses:   Food Insecurity:   . Worried About Charity fundraiser in the Last Year:   . Arboriculturist in the Last Year:   Transportation Needs:   . Film/video editor (Medical):   Marland Kitchen Lack of Transportation (Non-Medical):   Physical Activity: Unknown  . Days of Exercise per Week: 0 days  . Minutes of Exercise per Session: Not on file  Stress:   . Feeling of Stress :  Social Connections:   . Frequency of Communication with Friends and Family:   . Frequency of Social Gatherings with Friends and Family:   . Attends Religious Services:   . Active Member of Clubs or Organizations:   . Attends Archivist Meetings:   Marland Kitchen Marital Status:     Outpatient Encounter Medications as of 04/04/2019  Medication Sig  . ACCU-CHEK AVIVA PLUS test strip USE TO CHECK BLOOD SUGAR TWICE A DAY  . ACCU-CHEK SOFTCLIX LANCETS lancets CHECK BLOOD SUGAR ONCE A DAY DX E11.9  . albuterol (PROVENTIL HFA;VENTOLIN HFA) 108 (90 Base) MCG/ACT inhaler Inhale 2 puffs into the lungs every 6 (six) hours as needed for wheezing or shortness of breath.  Marland Kitchen aspirin 81 MG tablet Take 81 mg by mouth daily.  . calcium-vitamin D (OSCAL WITH D) 250-125 MG-UNIT tablet Take 1 tablet by mouth daily.  Marland Kitchen ezetimibe (ZETIA) 10 MG tablet TAKE 1 TABLET BY MOUTH  EVERY DAY  . glipiZIDE (GLUCOTROL XL) 5 MG 24 hr tablet TAKE 1 TABLET DAILY  . lisinopril (PRINIVIL,ZESTRIL) 10 MG tablet TAKE 1 TABLET DAILY  . metFORMIN (GLUCOPHAGE) 500 MG tablet TAKE 2 TABLETS TWICE A DAY  . Multiple Vitamin (MULTIVITAMIN) tablet Take 1 tablet by mouth daily.  Marland Kitchen omeprazole (PRILOSEC) 20 MG capsule Take 1 capsule (20 mg total) by mouth daily.  . [DISCONTINUED] gabapentin (NEURONTIN) 300 MG capsule TAKE 1 CAPSULE BY MOUTH THREE TIMES A DAY  . carbamide peroxide (DEBROX) 6.5 % OTIC solution Place 5 drops into the left ear daily. Massage ear for approximately 5 minutes.   No facility-administered encounter medications on file as of 04/04/2019.   Review of Systems  Constitutional: Negative for appetite change and unexpected weight change.  HENT: Negative for congestion and sinus pressure.   Respiratory: Negative for cough, chest tightness and shortness of breath.   Cardiovascular: Negative for chest pain, palpitations and leg swelling.  Gastrointestinal: Negative for abdominal pain, diarrhea and nausea.  Genitourinary: Negative for difficulty urinating and dysuria.  Musculoskeletal: Negative for joint swelling and myalgias.  Skin: Negative for color change and rash.  Neurological: Negative for dizziness, light-headedness and headaches.  Psychiatric/Behavioral: Negative for agitation and dysphoric mood.       Objective:    Physical Exam Vitals reviewed.  Constitutional:      General: She is not in acute distress.    Appearance: Normal appearance.  HENT:     Head: Normocephalic and atraumatic.     Right Ear: There is no impacted cerumen.     Left Ear: There is impacted cerumen.  Eyes:     General: No scleral icterus.       Right eye: No discharge.        Left eye: No discharge.     Conjunctiva/sclera: Conjunctivae normal.  Neck:     Thyroid: No thyromegaly.  Cardiovascular:     Rate and Rhythm: Normal rate and regular rhythm.  Pulmonary:     Effort: No  respiratory distress.     Breath sounds: Normal breath sounds. No wheezing.  Abdominal:     General: Bowel sounds are normal.     Palpations: Abdomen is soft.     Tenderness: There is no abdominal tenderness.  Musculoskeletal:        General: No swelling or tenderness.     Cervical back: Neck supple. No tenderness.  Lymphadenopathy:     Cervical: No cervical adenopathy.  Skin:    Findings: No erythema or rash.  Neurological:  Mental Status: She is alert and oriented to person, place, and time.  Psychiatric:        Mood and Affect: Mood normal.        Behavior: Behavior normal.     BP 112/78   Pulse 89   Temp (!) 95.2 F (35.1 C)   Resp 16   Ht 5' 3"  (1.6 m)   Wt 143 lb (64.9 kg)   SpO2 97%   BMI 25.33 kg/m  Wt Readings from Last 3 Encounters:  04/04/19 143 lb (64.9 kg)  02/24/19 141 lb 12.8 oz (64.3 kg)  01/24/19 142 lb (64.4 kg)     Lab Results  Component Value Date   WBC 8.1 02/14/2019   HGB 10.9 (L) 02/14/2019   HCT 34.6 (L) 02/14/2019   PLT 277.0 02/14/2019   GLUCOSE 107 (H) 02/14/2019   CHOL 174 02/14/2019   TRIG 76.0 02/14/2019   HDL 44.60 02/14/2019   LDLCALC 115 (H) 02/14/2019   ALT 11 02/14/2019   AST 13 02/14/2019   NA 137 02/14/2019   K 4.3 02/14/2019   CL 104 02/14/2019   CREATININE 0.85 02/14/2019   BUN 16 02/14/2019   CO2 26 02/14/2019   TSH 0.73 02/14/2019   HGBA1C 7.1 (H) 02/14/2019   MICROALBUR 1.6 02/14/2019    MM 3D SCREEN BREAST BILATERAL  Result Date: 07/10/2018 CLINICAL DATA:  Screening. EXAM: DIGITAL SCREENING BILATERAL MAMMOGRAM WITH TOMO AND CAD COMPARISON:  Previous exam(s). ACR Breast Density Category b: There are scattered areas of fibroglandular density. FINDINGS: There are no findings suspicious for malignancy. Images were processed with CAD. IMPRESSION: No mammographic evidence of malignancy. A result letter of this screening mammogram will be mailed directly to the patient. RECOMMENDATION: Screening mammogram in one  year. (Code:SM-B-01Y) BI-RADS CATEGORY  1: Negative. Electronically Signed   By: Margarette Canada M.D.   On: 07/10/2018 15:33       Assessment & Plan:   Problem List Items Addressed This Visit    Cerumen impaction    Cerumen impaction - left ear.  Debrox.  Return for ear irrigation.       Diabetes mellitus ([place])    On metformin.  Low carb diet and exercise.  Discussed a1c - 7.1.  Discussed diet and exercise and medication adjustment.  Follow sugars.  Follow met b and a1c.        Relevant Orders   Hemoglobin A1c   Hypercholesterolemia    On zetia.  Tolerating.  Low cholesterol diet and exercise.  Follow lipid panel.        Relevant Orders   Hepatic function panel   Lipid panel   Hypertension    Blood pressure under good control.  Continue same medication regimen - lisinopril.   Follow pressures.  Follow metabolic panel.        Relevant Orders   Basic metabolic panel   Leg pain    Leg numbness - intermittent.  Also some occasional arm numbness.  Discussed further w/up.  Wants to monitor.  Hold on further w/up.  Follow.        Microcytic anemia    Has a history of chronic microcytic anemia.  Saw hematology.  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  Recommended continued f/u.  If hemoglobin <9 0 further w/up.        Relevant Orders   CBC with Differential/Platelet   Numbness and tingling    Has been worked up by neurology previously.  Had MRI.  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  Off gabapentin now.  Symptoms as outlined.  Discussed further w/up.  Wants to monitor.  Follow.        Stress    Increased stress.  Overall she feels she is handling things relatively well. Has good support.  Does not feel needs any further intervention.  Follow.            Einar Pheasant, MD

## 2019-04-04 NOTE — Telephone Encounter (Signed)
I do not mind if something is put in her ears when she comes in, but I thought it was usually easier if she uses something several days prior to coming in.  Per note, pharmacy can get debrox on Monday.  She can start using on Monday and then just schedule ear irrigation several days to one week later.

## 2019-04-05 DIAGNOSIS — H6120 Impacted cerumen, unspecified ear: Secondary | ICD-10-CM

## 2019-04-05 HISTORY — DX: Impacted cerumen, unspecified ear: H61.20

## 2019-04-05 NOTE — Assessment & Plan Note (Signed)
Has been worked up by neurology previously.  Had MRI.  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  Off gabapentin now.  Symptoms as outlined.  Discussed further w/up.  Wants to monitor.  Follow.

## 2019-04-05 NOTE — Assessment & Plan Note (Signed)
Leg numbness - intermittent.  Also some occasional arm numbness.  Discussed further w/up.  Wants to monitor.  Hold on further w/up.  Follow.

## 2019-04-05 NOTE — Assessment & Plan Note (Signed)
On metformin.  Low carb diet and exercise.  Discussed a1c - 7.1.  Discussed diet and exercise and medication adjustment.  Follow sugars.  Follow met b and a1c.

## 2019-04-05 NOTE — Assessment & Plan Note (Signed)
Increased stress.  Overall she feels she is handling things relatively well. Has good support.  Does not feel needs any further intervention.  Follow.

## 2019-04-05 NOTE — Assessment & Plan Note (Signed)
Cerumen impaction - left ear.  Debrox.  Return for ear irrigation.

## 2019-04-05 NOTE — Assessment & Plan Note (Signed)
Blood pressure under good control.  Continue same medication regimen - lisinopril.   Follow pressures.  Follow metabolic panel.

## 2019-04-05 NOTE — Assessment & Plan Note (Signed)
Has a history of chronic microcytic anemia.  Saw hematology.  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  Recommended continued f/u.  If hemoglobin <9 0 further w/up.

## 2019-04-05 NOTE — Assessment & Plan Note (Signed)
On zetia.  Tolerating.  Low cholesterol diet and exercise.  Follow lipid panel.

## 2019-04-08 ENCOUNTER — Ambulatory Visit: Payer: Medicare Other

## 2019-04-10 ENCOUNTER — Other Ambulatory Visit: Payer: Self-pay

## 2019-04-10 ENCOUNTER — Ambulatory Visit: Payer: Medicare Other

## 2019-05-26 ENCOUNTER — Other Ambulatory Visit: Payer: Self-pay | Admitting: Internal Medicine

## 2019-06-04 ENCOUNTER — Other Ambulatory Visit (INDEPENDENT_AMBULATORY_CARE_PROVIDER_SITE_OTHER): Payer: Medicare Other

## 2019-06-04 ENCOUNTER — Other Ambulatory Visit: Payer: Self-pay | Admitting: Internal Medicine

## 2019-06-04 ENCOUNTER — Other Ambulatory Visit: Payer: Self-pay

## 2019-06-04 DIAGNOSIS — E7800 Pure hypercholesterolemia, unspecified: Secondary | ICD-10-CM

## 2019-06-04 DIAGNOSIS — I10 Essential (primary) hypertension: Secondary | ICD-10-CM

## 2019-06-04 DIAGNOSIS — D509 Iron deficiency anemia, unspecified: Secondary | ICD-10-CM

## 2019-06-04 DIAGNOSIS — E1165 Type 2 diabetes mellitus with hyperglycemia: Secondary | ICD-10-CM

## 2019-06-04 LAB — BASIC METABOLIC PANEL
BUN: 22 mg/dL (ref 6–23)
CO2: 26 mEq/L (ref 19–32)
Calcium: 9.4 mg/dL (ref 8.4–10.5)
Chloride: 101 mEq/L (ref 96–112)
Creatinine, Ser: 0.8 mg/dL (ref 0.40–1.20)
GFR: 83.06 mL/min (ref >60.00)
Glucose, Bld: 136 mg/dL — ABNORMAL HIGH (ref 70–99)
Potassium: 4.3 mEq/L (ref 3.5–5.1)
Sodium: 136 mEq/L (ref 135–145)

## 2019-06-04 LAB — LIPID PANEL
Cholesterol: 174 mg/dL (ref 0–200)
HDL: 44.6 mg/dL (ref >39.00)
LDL Cholesterol: 112 mg/dL — ABNORMAL HIGH (ref 0–99)
NonHDL: 129.64
Total CHOL/HDL Ratio: 4
Triglycerides: 88 mg/dL (ref 0.0–149.0)
VLDL: 17.6 mg/dL (ref 0.0–40.0)

## 2019-06-04 LAB — HEPATIC FUNCTION PANEL
ALT: 12 U/L (ref 0–35)
AST: 12 U/L (ref 0–37)
Albumin: 4.3 g/dL (ref 3.5–5.2)
Alkaline Phosphatase: 45 U/L (ref 39–117)
Bilirubin, Direct: 0.1 mg/dL (ref 0.0–0.3)
Total Bilirubin: 0.3 mg/dL (ref 0.2–1.2)
Total Protein: 7.1 g/dL (ref 6.0–8.3)

## 2019-06-05 ENCOUNTER — Telehealth: Payer: Self-pay

## 2019-06-05 NOTE — Addendum Note (Signed)
Addended by: Ezequiel Ganser on: 06/05/2019 03:44 PM   Modules accepted: Orders

## 2019-06-05 NOTE — Addendum Note (Signed)
Addended by: Ezequiel Ganser on: 06/05/2019 03:46 PM   Modules accepted: Orders

## 2019-06-05 NOTE — Telephone Encounter (Signed)
Called patient to request a return to the lab to draw CBC and A1C. Patient stated that she will call back to reschedule for those labs.

## 2019-06-06 ENCOUNTER — Encounter: Payer: Self-pay | Admitting: Internal Medicine

## 2019-06-06 ENCOUNTER — Other Ambulatory Visit: Payer: Self-pay

## 2019-06-06 ENCOUNTER — Telehealth: Payer: Self-pay | Admitting: Internal Medicine

## 2019-06-06 ENCOUNTER — Ambulatory Visit: Payer: Medicare Other | Admitting: Internal Medicine

## 2019-06-06 VITALS — BP 114/76 | HR 72 | Temp 96.2°F | Resp 16 | Ht 63.0 in | Wt 137.0 lb

## 2019-06-06 DIAGNOSIS — F439 Reaction to severe stress, unspecified: Secondary | ICD-10-CM

## 2019-06-06 DIAGNOSIS — E7800 Pure hypercholesterolemia, unspecified: Secondary | ICD-10-CM

## 2019-06-06 DIAGNOSIS — I10 Essential (primary) hypertension: Secondary | ICD-10-CM

## 2019-06-06 DIAGNOSIS — E1165 Type 2 diabetes mellitus with hyperglycemia: Secondary | ICD-10-CM

## 2019-06-06 DIAGNOSIS — D509 Iron deficiency anemia, unspecified: Secondary | ICD-10-CM

## 2019-06-06 DIAGNOSIS — E039 Hypothyroidism, unspecified: Secondary | ICD-10-CM | POA: Diagnosis not present

## 2019-06-06 DIAGNOSIS — Z1231 Encounter for screening mammogram for malignant neoplasm of breast: Secondary | ICD-10-CM

## 2019-06-06 DIAGNOSIS — R221 Localized swelling, mass and lump, neck: Secondary | ICD-10-CM

## 2019-06-06 LAB — CBC WITH DIFFERENTIAL/PLATELET
Basophils Absolute: 0.1 10*3/uL (ref 0.0–0.1)
Basophils Relative: 1.1 % (ref 0.0–3.0)
Eosinophils Absolute: 0.1 10*3/uL (ref 0.0–0.7)
Eosinophils Relative: 1.3 % (ref 0.0–5.0)
HCT: 35.8 % — ABNORMAL LOW (ref 36.0–46.0)
Hemoglobin: 11.5 g/dL — ABNORMAL LOW (ref 12.0–15.0)
Lymphocytes Relative: 23.3 % (ref 12.0–46.0)
Lymphs Abs: 2.2 10*3/uL (ref 0.7–4.0)
MCHC: 32.1 g/dL (ref 30.0–36.0)
MCV: 78.9 fl (ref 78.0–100.0)
Monocytes Absolute: 0.8 10*3/uL (ref 0.1–1.0)
Monocytes Relative: 8.2 % (ref 3.0–12.0)
Neutro Abs: 6.2 10*3/uL (ref 1.4–7.7)
Neutrophils Relative %: 66.1 % (ref 43.0–77.0)
Platelets: 301 10*3/uL (ref 150.0–400.0)
RBC: 4.53 Mil/uL (ref 3.87–5.11)
RDW: 16.8 % — ABNORMAL HIGH (ref 11.5–15.5)
WBC: 9.3 10*3/uL (ref 4.0–10.5)

## 2019-06-06 LAB — HEMOGLOBIN A1C: Hgb A1c MFr Bld: 6.7 % — ABNORMAL HIGH (ref 4.6–6.5)

## 2019-06-06 LAB — TSH: TSH: 0.83 u[IU]/mL (ref 0.35–4.50)

## 2019-06-06 LAB — T4, FREE: Free T4: 0.88 ng/dL (ref 0.60–1.60)

## 2019-06-06 NOTE — Telephone Encounter (Signed)
Left pt vm to call ofc to sch [place].

## 2019-06-06 NOTE — Progress Notes (Signed)
[name], female   DOB: 1935-05-13, 84 y.o.   MRN: [id number]   Subjective:    [name], female    DOB: 1935/12/29, 84 y.o.   MRN: [id number]  HPI This visit occurred during the SARS-CoV-2 public health emergency.  Safety protocols were in place, including screening questions prior to the visit, additional usage of staff PPE, and extensive cleaning of exam room while observing appropriate contact time as indicated for disinfecting solutions.  Patient here for a scheduled follow up.  Here to f/u regarding her diabetes, hypertension and hypercholesterolemia.  She reports she is doing relatively well.  Trying to stay active.  No chest pain or sob reported.  No abdominal pain.  Bowels moving.  Handling stress.  Discussed with her today.  Does not feel she needs any further intervention.  Discussed labs.  a1c not resulted.  Will need to check today.  Discussed recommendation to be on statin medication.  She is agreeable to try a low dose again.  No swallowing problems.     Past Medical History:  Diagnosis Date  . Anemia   . Diabetes mellitus ([place])   . GERD (gastroesophageal reflux disease)   . Hypercholesterolemia   . Hypertension   . Hypothyroidism   . Osteoporosis    Past Surgical History:  Procedure Laterality Date  . COLONOSCOPY    . COLONOSCOPY WITH PROPOFOL N/A 11/09/2017   Procedure: COLONOSCOPY WITH PROPOFOL;  Surgeon: Lollie Sails, MD;  Location: [facility] ENDOSCOPY;  Service: Endoscopy;  Laterality: N/A;  . CYST EXCISION     low back  . ESOPHAGOGASTRODUODENOSCOPY (EGD) WITH PROPOFOL N/A 11/09/2017   Procedure: ESOPHAGOGASTRODUODENOSCOPY (EGD) WITH PROPOFOL;  Surgeon: Lollie Sails, MD;  Location: [facility] ENDOSCOPY;  Service: Endoscopy;  Laterality: N/A;  . THYROID SURGERY  1992   left hemi-thyroidectomy (colloid goiter0  . UPPER GASTROINTESTINAL ENDOSCOPY     Family History  Problem Relation Age of Onset  . Emphysema Father   . Heart  disease Mother        myocardial infarction (69)  . Hypertension Mother   . Diabetes Mother   . Heart disease Brother        drug use  . Diabetes Mellitus II Sister        x3  . Hypertension Sister   . Breast cancer Other        niece  . Breast cancer Sister   . Cancer Sister   . Colon cancer Neg Hx    Social History   Socioeconomic History  . Marital status: Married    Spouse name: Not on file  . Number of children: 2  . Years of education: Not on file  . Highest education level: Not on file  Occupational History  . Not on file  Tobacco Use  . Smoking status: Never Smoker  . Smokeless tobacco: Never Used  Substance and Sexual Activity  . Alcohol use: No    Alcohol/week: 0.0 standard drinks    Comment: occasional  . Drug use: Never  . Sexual activity: Not on file  Other Topics Concern  . Not on file  Social History Narrative   In [place]; with husband; never smoked;  no alcohol; telephone receptionist.    Social Determinants of Health   Financial Resource Strain:   . Difficulty of Paying Living Expenses:   Food Insecurity:   . Worried About Charity fundraiser in the Last Year:   . [employer]  in the Last Year:   Transportation Needs:   . Film/video editor (Medical):   Marland Kitchen Lack of Transportation (Non-Medical):   Physical Activity: Unknown  . Days of Exercise per Week: 0 days  . Minutes of Exercise per Session: Not on file  Stress:   . Feeling of Stress :   Social Connections:   . Frequency of Communication with Friends and Family:   . Frequency of Social Gatherings with Friends and Family:   . Attends Religious Services:   . Active Member of Clubs or Organizations:   . Attends Archivist Meetings:   Marland Kitchen Marital Status:     Outpatient Encounter Medications as of 06/06/2019  Medication Sig  . ACCU-CHEK AVIVA PLUS test strip USE TO CHECK BLOOD SUGAR TWICE A DAY  . ACCU-CHEK SOFTCLIX LANCETS lancets CHECK BLOOD SUGAR ONCE A DAY DX E11.9    . albuterol (PROVENTIL HFA;VENTOLIN HFA) 108 (90 Base) MCG/ACT inhaler Inhale 2 puffs into the lungs every 6 (six) hours as needed for wheezing or shortness of breath.  Marland Kitchen aspirin 81 MG tablet Take 81 mg by mouth daily.  . calcium-vitamin D (OSCAL WITH D) 250-125 MG-UNIT tablet Take 1 tablet by mouth daily.  . carbamide peroxide (DEBROX) 6.5 % OTIC solution Place 5 drops into the left ear daily. Massage ear for approximately 5 minutes.  Marland Kitchen ezetimibe (ZETIA) 10 MG tablet TAKE 1 TABLET BY MOUTH EVERY DAY  . glipiZIDE (GLUCOTROL XL) 5 MG 24 hr tablet TAKE 1 TABLET DAILY  . lisinopril (ZESTRIL) 10 MG tablet TAKE 1 TABLET DAILY  . metFORMIN (GLUCOPHAGE) 500 MG tablet TAKE 2 TABLETS TWICE A DAY  . Multiple Vitamin (MULTIVITAMIN) tablet Take 1 tablet by mouth daily.  Marland Kitchen omeprazole (PRILOSEC) 20 MG capsule Take 1 capsule (20 mg total) by mouth daily.  . rosuvastatin (CRESTOR) 5 MG tablet Take one tablet 2 days per week.   No facility-administered encounter medications on file as of 06/06/2019.    Review of Systems  Constitutional: Negative for appetite change and unexpected weight change.  HENT: Negative for congestion and sinus pressure.   Respiratory: Negative for cough, chest tightness and shortness of breath.   Cardiovascular: Negative for chest pain, palpitations and leg swelling.  Gastrointestinal: Negative for abdominal pain, nausea and vomiting.  Genitourinary: Negative for difficulty urinating and dysuria.  Musculoskeletal: Negative for joint swelling and myalgias.  Skin: Negative for color change and rash.  Neurological: Negative for dizziness, light-headedness and headaches.  Psychiatric/Behavioral: Negative for agitation and dysphoric mood.       Objective:    Physical Exam Vitals reviewed.  Constitutional:      General: She is not in acute distress.    Appearance: Normal appearance.  HENT:     Head: Normocephalic and atraumatic.     Left Ear: External ear normal.  Eyes:      General:        Right eye: No discharge.        Left eye: No discharge.     Conjunctiva/sclera: Conjunctivae normal.  Neck:     Thyroid: No thyromegaly.     Comments: Anterior neck fullness - thyroid fullness Cardiovascular:     Rate and Rhythm: Normal rate and regular rhythm.  Pulmonary:     Effort: No respiratory distress.     Breath sounds: Normal breath sounds. No wheezing.  Abdominal:     General: Bowel sounds are normal.     Palpations: Abdomen is soft.  Tenderness: There is no abdominal tenderness.  Musculoskeletal:        General: No swelling or tenderness.     Cervical back: Neck supple. No tenderness.  Lymphadenopathy:     Cervical: No cervical adenopathy.  Skin:    Findings: No erythema or rash.  Neurological:     Mental Status: She is alert.  Psychiatric:        Mood and Affect: Mood normal.        Behavior: Behavior normal.     BP 114/76   Pulse 72   Temp (!) 96.2 F (35.7 C)   Resp 16   Ht _0  (1.6 m)   Wt 137 lb (62.1 kg)   SpO2 98%   BMI 24.27 kg/m  Wt Readings from Last 3 Encounters:  06/06/19 137 lb (62.1 kg)  04/04/19 143 lb (64.9 kg)  02/24/19 141 lb 12.8 oz (64.3 kg)     Lab Results  Component Value Date   WBC 9.3 06/06/2019   HGB 11.5 (L) 06/06/2019   HCT 35.8 (L) 06/06/2019   PLT 301.0 06/06/2019   GLUCOSE 136 (H) 06/04/2019   CHOL 174 06/04/2019   TRIG 88.0 06/04/2019   HDL 44.60 06/04/2019   LDLCALC 112 (H) 06/04/2019   ALT 12 06/04/2019   AST 12 06/04/2019   NA 136 06/04/2019   K 4.3 06/04/2019   CL 101 06/04/2019   CREATININE 0.80 06/04/2019   BUN 22 06/04/2019   CO2 26 06/04/2019   TSH 0.83 06/06/2019   HGBA1C 6.7 (H) 06/06/2019   MICROALBUR 1.6 02/14/2019    MM 3D SCREEN BREAST BILATERAL  Result Date: 07/10/2018 CLINICAL DATA:  Screening. EXAM: DIGITAL SCREENING BILATERAL MAMMOGRAM WITH TOMO AND CAD COMPARISON:  Previous exam(s). ACR Breast Density Category b: There are scattered areas of fibroglandular  density. FINDINGS: There are no findings suspicious for malignancy. Images were processed with CAD. IMPRESSION: No mammographic evidence of malignancy. A result letter of this screening mammogram will be mailed directly to the patient. RECOMMENDATION: Screening mammogram in one year. (Code:SM-B-01Y) BI-RADS CATEGORY  1: Negative. Electronically Signed   By: Margarette Canada M.D.   On: 07/10/2018 15:33       Assessment & Plan:   Problem List Items Addressed This Visit    Diabetes mellitus ([place])    On metformin and glipizide.  Low carb diet and exercise.  Follow met b and a1c.        Relevant Medications   rosuvastatin (CRESTOR) 5 MG tablet   Other Relevant Orders   Hemoglobin A1c (Completed)   Hypercholesterolemia    Intolerant to statin medication.  On zetia.  Discussed cholesterol results with her today.  Discussed low dose crestor - two days per week. She was agreeable to retry.  Follow lipid panel and liver function tests.        Relevant Medications   rosuvastatin (CRESTOR) 5 MG tablet   Hypertension    Blood pressure as outlined.  Continue lisinopril.  Follow pressures.  Follow metabolic panel.        Relevant Medications   rosuvastatin (CRESTOR) 5 MG tablet   Hypothyroidism    History of hypothyroidism.  Check thyroid function tests as outlined.        Relevant Orders   TSH (Completed)   T4, free (Completed)   Microcytic anemia - Primary    Has a history of chronic microcytic anemia. Has seen hematology.  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 Recommended follow cbc.  If  hgb <9 - further w/up.        Relevant Orders   CBC with Differential/Platelet (Completed)   IBC + Ferritin   Neck fullness    Anterior neck fullness.  Obtain thyroid ultrasound.  Check free T4 and tsh.        Relevant Orders   US THYROID   TSH (Completed)   T4, free (Completed)   Stress    Discussed with her today.  Overall she feels she is handling things well. Has good support.  Does not feel needs  anything more at this time.  Follow.        Other Visit Diagnoses    Visit for screening mammogram       Relevant Orders   MM 3D SCREEN BREAST BILATERAL       Einar Pheasant, MD

## 2019-06-10 ENCOUNTER — Telehealth: Payer: Self-pay | Admitting: Internal Medicine

## 2019-06-10 ENCOUNTER — Encounter: Payer: Self-pay | Admitting: Internal Medicine

## 2019-06-10 DIAGNOSIS — E7800 Pure hypercholesterolemia, unspecified: Secondary | ICD-10-CM

## 2019-06-10 MED ORDER — ROSUVASTATIN CALCIUM 5 MG PO TABS
ORAL_TABLET | ORAL | 0 refills | Status: DC
Start: 2019-06-10 — End: 2019-08-25

## 2019-06-10 NOTE — Assessment & Plan Note (Signed)
On metformin and glipizide.  Low carb diet and exercise.  Follow met b and a1c.

## 2019-06-10 NOTE — Assessment & Plan Note (Signed)
Blood pressure as outlined.  Continue lisinopril.  Follow pressures.  Follow metabolic panel.   

## 2019-06-10 NOTE — Telephone Encounter (Signed)
Pt aware of results and will pick up crestor and hold zetia. Scheduled for labs and noted on schedule- liver panel only

## 2019-06-10 NOTE — Assessment & Plan Note (Signed)
Discussed with her today.  Overall she feels she is handling things well. Has good support.  Does not feel needs anything more at this time.  Follow.

## 2019-06-10 NOTE — Assessment & Plan Note (Signed)
Anterior neck fullness.  Obtain thyroid ultrasound.  Check free T4 and tsh.

## 2019-06-10 NOTE — Telephone Encounter (Signed)
I had discussed with her at her appt starting crestor.  She has tried previously and had some intolerance.  We discussed trying 5mg  only two days per week.  I have sent in rx.  Have her hold zetia for now.  Start the crestor.  She will need a f/u liver panel in 6 weeks.  Schedule non fasting lab in 6 weeks.  Make note or notify lab that I only need the liver.  (do not need the cbc and a1c).  Thanks.

## 2019-06-10 NOTE — Assessment & Plan Note (Signed)
History of hypothyroidism.  Check thyroid function tests as outlined.

## 2019-06-10 NOTE — Assessment & Plan Note (Signed)
Has a history of chronic microcytic anemia. Has seen hematology.  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 Recommended follow cbc.  If hgb <9 - further w/up.

## 2019-06-10 NOTE — Assessment & Plan Note (Signed)
Intolerant to statin medication.  On zetia.  Discussed cholesterol results with her today.  Discussed low dose crestor - two days per week. She was agreeable to retry.  Follow lipid panel and liver function tests.

## 2019-06-16 ENCOUNTER — Ambulatory Visit: Payer: Medicare Other

## 2019-06-19 ENCOUNTER — Other Ambulatory Visit: Payer: Self-pay

## 2019-06-19 ENCOUNTER — Ambulatory Visit
Admission: RE | Admit: 2019-06-19 | Discharge: 2019-06-19 | Disposition: A | Discharge status: Home / Self Care | Payer: Medicare Other | Source: Ambulatory Visit | Attending: Internal Medicine | Admitting: Internal Medicine

## 2019-06-19 DIAGNOSIS — R221 Localized swelling, mass and lump, neck: Secondary | ICD-10-CM | POA: Insufficient documentation

## 2019-06-21 ENCOUNTER — Other Ambulatory Visit: Payer: Self-pay | Admitting: Internal Medicine

## 2019-06-21 DIAGNOSIS — E041 Nontoxic single thyroid nodule: Secondary | ICD-10-CM

## 2019-06-21 DIAGNOSIS — E049 Nontoxic goiter, unspecified: Secondary | ICD-10-CM

## 2019-06-21 NOTE — Progress Notes (Signed)
Order placed for endocrinology referral.  

## 2019-06-23 ENCOUNTER — Ambulatory Visit (INDEPENDENT_AMBULATORY_CARE_PROVIDER_SITE_OTHER): Payer: Medicare Other

## 2019-06-23 VITALS — BP 121/74 | HR 71 | Ht 63.0 in | Wt 137.0 lb

## 2019-06-23 DIAGNOSIS — Z0000 Encounter for general adult medical examination without abnormal findings: Secondary | ICD-10-CM

## 2019-06-23 NOTE — Progress Notes (Signed)
Subjective:   Wanda Ruiz is a 84 y.o. female who presents for Medicare Annual (Subsequent) preventive examination.  Review of Systems:  No ROS.  Medicare Wellness Virtual Visit.   Cardiac Risk Factors include: advanced age (>84men, >68 women);diabetes mellitus;hypertension     Objective:     Vitals: BP 121/74 (BP Location: Left Arm, Patient Position: Sitting, Cuff Size: Normal)   Pulse 71   Ht 5\' 3"  (1.6 m)   Wt 137 lb (62.1 kg)   BMI 24.27 kg/m   Body mass index is 24.27 kg/m.  Advanced Directives 06/23/2019 02/21/2019 06/13/2018 11/09/2017 06/11/2017 06/17/2016 06/17/2016  Does Patient Have a Medical Advance Directive? Yes No Yes No No Yes Yes  Type of Paramedic of [place];Living will - - - - - Press photographer;Living will  Does patient want to make changes to medical advance directive? No - Patient declined - No - Patient declined - - - -  Copy of [place] in Chart? No - copy requested - - - - - -  Would patient like information on creating a medical advance directive? - No - Patient declined - - No - Patient declined - -    Tobacco Social History   Tobacco Use  Smoking Status Never Smoker  Smokeless Tobacco Never Used     Counseling given: Not Answered   Clinical Intake:  Pre-visit preparation completed: Yes        Diabetes: Yes (Followed by pcp)  How often do you need to have someone help you when you read instructions, pamphlets, or other written materials from your doctor or pharmacy?: 1 - Never  Interpreter Needed?: No     Past Medical History:  Diagnosis Date  . Anemia   . Diabetes mellitus ([place])   . GERD (gastroesophageal reflux disease)   . Hypercholesterolemia   . Hypertension   . Hypothyroidism   . Osteoporosis    Past Surgical History:  Procedure Laterality Date  . COLONOSCOPY    . COLONOSCOPY WITH PROPOFOL N/A 11/09/2017   Procedure: COLONOSCOPY WITH PROPOFOL;  Surgeon: Lollie Sails, MD;  Location: [facility] ENDOSCOPY;  Service: Endoscopy;  Laterality: N/A;  . CYST EXCISION     low back  . ESOPHAGOGASTRODUODENOSCOPY (EGD) WITH PROPOFOL N/A 11/09/2017   Procedure: ESOPHAGOGASTRODUODENOSCOPY (EGD) WITH PROPOFOL;  Surgeon: Lollie Sails, MD;  Location: [facility] ENDOSCOPY;  Service: Endoscopy;  Laterality: N/A;  . THYROID SURGERY  1992   left hemi-thyroidectomy (colloid goiter0  . UPPER GASTROINTESTINAL ENDOSCOPY     Family History  Problem Relation Age of Onset  . Emphysema Father   . Heart disease Mother        myocardial infarction (69)  . Hypertension Mother   . Diabetes Mother   . Heart disease Brother        drug use  . Diabetes Mellitus II Sister        x3  . Hypertension Sister   . Breast cancer Other        niece  . Breast cancer Sister   . Cancer Sister   . Colon cancer Neg Hx    Social History   Socioeconomic History  . Marital status: Married    Spouse name: Not on file  . Number of children: 2  . Years of education: Not on file  . Highest education level: Not on file  Occupational History  . Not on file  Tobacco Use  . Smoking status: Never Smoker  .  Smokeless tobacco: Never Used  Vaping Use  . Vaping Use: Never used  Substance and Sexual Activity  . Alcohol use: No    Alcohol/week: 0.0 standard drinks    Comment: occasional  . Drug use: Never  . Sexual activity: Not on file  Other Topics Concern  . Not on file  Social History Narrative   In [place]; with husband; never smoked;  no alcohol; telephone receptionist.    Social Determinants of Health   Financial Resource Strain:   . Difficulty of Paying Living Expenses:   Food Insecurity:   . Worried About Charity fundraiser in the Last Year:   . Arboriculturist in the Last Year:   Transportation Needs:   . Film/video editor (Medical):   Marland Kitchen Lack of Transportation (Non-Medical):   Physical Activity:   . Days of Exercise per Week:   . Minutes of Exercise per  Session:   Stress:   . Feeling of Stress :   Social Connections:   . Frequency of Communication with Friends and Family:   . Frequency of Social Gatherings with Friends and Family:   . Attends Religious Services:   . Active Member of Clubs or Organizations:   . Attends Archivist Meetings:   Marland Kitchen Marital Status:     Outpatient Encounter Medications as of 06/23/2019  Medication Sig  . ACCU-CHEK AVIVA PLUS test strip USE TO CHECK BLOOD SUGAR TWICE A DAY  . ACCU-CHEK SOFTCLIX LANCETS lancets CHECK BLOOD SUGAR ONCE A DAY DX E11.9  . albuterol (PROVENTIL HFA;VENTOLIN HFA) 108 (90 Base) MCG/ACT inhaler Inhale 2 puffs into the lungs every 6 (six) hours as needed for wheezing or shortness of breath.  Marland Kitchen aspirin 81 MG tablet Take 81 mg by mouth daily.  . calcium-vitamin D (OSCAL WITH D) 250-125 MG-UNIT tablet Take 1 tablet by mouth daily.  . carbamide peroxide (DEBROX) 6.5 % OTIC solution Place 5 drops into the left ear daily. Massage ear for approximately 5 minutes.  Marland Kitchen ezetimibe (ZETIA) 10 MG tablet TAKE 1 TABLET BY MOUTH EVERY DAY (Patient not taking: Reported on 06/23/2019)  . glipiZIDE (GLUCOTROL XL) 5 MG 24 hr tablet TAKE 1 TABLET DAILY  . lisinopril (ZESTRIL) 10 MG tablet TAKE 1 TABLET DAILY  . metFORMIN (GLUCOPHAGE) 500 MG tablet TAKE 2 TABLETS TWICE A DAY  . Multiple Vitamin (MULTIVITAMIN) tablet Take 1 tablet by mouth daily.  Marland Kitchen omeprazole (PRILOSEC) 20 MG capsule Take 1 capsule (20 mg total) by mouth daily.  . rosuvastatin (CRESTOR) 5 MG tablet Take one tablet 2 days per week.   No facility-administered encounter medications on file as of 06/23/2019.    Activities of Daily Living In your present state of health, do you have any difficulty performing the following activities: 06/23/2019  Hearing? N  Vision? N  Difficulty concentrating or making decisions? N  Walking or climbing stairs? N  Dressing or bathing? N  Doing errands, shopping? N  Preparing Food and eating ? N  Using  the Toilet? N  In the past six months, have you accidently leaked urine? N  Do you have problems with loss of bowel control? N  Managing your Medications? N  Managing your Finances? N  Housekeeping or managing your Housekeeping? N  Some recent data might be hidden    Patient Care Team: Einar Pheasant, MD as PCP - General (Internal Medicine)    Assessment:   This is a routine wellness examination for Wanda Ruiz.  I connected  with Shaney today by telephone and verified that I am speaking with the correct person using two identifiers. Location patient: home Location provider: work Persons participating in the virtual visit: patient, Marine scientist.    I discussed the limitations, risks, security and privacy concerns of performing an evaluation and management service by telephone and the availability of in person appointments. The patient expressed understanding and verbally consented to this telephonic visit.    Interactive audio and video telecommunications were attempted between this provider and patient, however failed, due to patient having technical difficulties OR patient did not have access to video capability.  We continued and completed visit with audio only.  Some vital signs may be absent or patient reported.   Health Maintenance Due: -Tdap vaccine- discussed; to be completed with doctor in visit or local pharmacy.   -Mammogram- scheduled 07/11/19 -Hgb A1c- 06/06/19 See completed HM at the end of note.   Eye: Visual acuity not assessed. Virtual visit. Followed by their ophthalmologist.  Retinopathy- none reported.  Dental: Visits every  months.    Hearing: Demonstrates normal hearing during visit.  Safety:  Patient feels safe at home- yes Patient does have smoke detectors at home- yes Patient does wear sunscreen or protective clothing when in direct sunlight - yes Patient does wear seat belt when in a moving vehicle - yes Patient drives- yes Adequate lighting in walkways  free from debris- yes Grab bars and handrails used as appropriate- yes Ambulates with an assistive device- no Cell phone/Medic alert on person when ambulating outside of the home-yes  Social: Alcohol intake - no       Smoking history- never Smokers in home? none Illicit drug use? none  Medication: Taking as directed and without issues.  Pill box in use -yes  Self managed - yes   Covid-19: Precautions and sickness symptoms discussed. Wears mask, social distancing, hand hygiene as appropriate.   Activities of Daily Living Patient denies needing assistance with: household chores, feeding themselves, getting from bed to chair, getting to the toilet, bathing/showering, dressing, managing money, or preparing meals.   Discussed the importance of a healthy diet, water intake and the benefits of aerobic exercise.  Physical activity- active around the house, no routine. Plans to use treadmill and stationary bike.   Diet:  Modified Water: good intake Caffeine: minimal  Other Providers Patient Care Team: Einar Pheasant, MD as PCP - General (Internal Medicine) Exercise Activities and Dietary recommendations Current Exercise Habits: Home exercise routine, Intensity: Mild  Goals      Patient Stated   .  Increase physical activity (pt-stated)      Treadmill or stationary bike for exercise       Fall Risk Fall Risk  06/23/2019 06/13/2018 08/10/2017 06/11/2017 06/21/2016  Falls in the past year? 0 0 No No No  Number falls in past yr: 0 - - - -  Follow up Falls evaluation completed - - - -   Is the patient's home free of loose throw rugs in walkways, pet beds, electrical cords, etc?  Yes      Grab bars in the bathroom?  Yes      Handrails on the stairs?  Yes      Adequate lighting?  Yes  Timed Get Up and Go performed: No, virtual visit  Depression Screen PHQ 2/9 Scores 06/23/2019 06/13/2018 08/10/2017 06/11/2017  PHQ - 2 Score 0 1 0 0  PHQ- 9 Score - - - -     Cognitive  Function Patient  is alert and oriented x3. Patient denies difficulty focusing or concentrating. Patient likes to read, play brain challenging games and Bible Study for  brain health.  MMSE - Mini Mental State Exam 06/09/2016  Orientation to time 5  Orientation to Place 5  Registration 3  Attention/ Calculation 5  Recall 3  Language- name 2 objects 2  Language- repeat 1  Language- follow 3 step command 3  Language- read & follow direction 1  Write a sentence 1  Copy design 1  Total score 30     6CIT Screen 06/23/2019 06/13/2018 06/11/2017  What Year? 0 points 0 points 0 points  What month? 0 points 0 points 0 points  What time? - 0 points 0 points  Count back from 20 - 0 points 0 points  Months in reverse 0 points 0 points 0 points  Repeat phrase - 0 points 0 points  Total Score - 0 0    Immunization History  Administered Date(s) Administered  . Fluad Quad(high Dose 65+) 09/10/2018  . Influenza, High Dose Seasonal PF 10/22/2015, 09/19/2016, 11/22/2017  . Influenza,inj,Quad PF,6+ Mos 10/13/2013, 10/15/2014  . PFIZER SARS-COV-2 Vaccination 02/07/2019, 02/28/2019  . Pneumococcal Conjugate-13 02/13/2014  . Pneumococcal Polysaccharide-23 01/25/2017   Screening Tests Health Maintenance  Topic Date Due  . TETANUS/TDAP  Never done  . MAMMOGRAM  07/10/2019  . INFLUENZA VACCINE  08/10/2019  . FOOT EXAM  10/21/2019  . HEMOGLOBIN A1C  12/07/2019  . OPHTHALMOLOGY EXAM  02/13/2020  . DEXA SCAN  Completed  . COVID-19 Vaccine  Completed  . PNA vac Low Risk Adult  Completed      Plan:   Keep all routine maintenance appointments.   Next scheduled lab 07/22/19 @ 2:00  Next scheduled 10/09/19 @ 10:00  Cpe 10/13/19 @ 10:00  Medicare Attestation I have personally reviewed: The patient's medical and social history Their use of alcohol, tobacco or illicit drugs Their current medications and supplements The patient's functional ability including ADLs,fall risks, home safety risks,  cognitive, and hearing and visual impairment Diet and physical activities Evidence for depression   I have reviewed and discussed with patient certain preventive protocols, quality metrics, and best practice recommendations.      Varney Biles, LPN  5/00/3704

## 2019-06-23 NOTE — Patient Instructions (Addendum)
  Wanda Ruiz , Thank you for taking time to come for your Medicare Wellness Visit. I appreciate your ongoing commitment to your health goals. Please review the following plan we discussed and let me know if I can assist you in the future.   These are the goals we discussed: Goals      Patient Stated   .  Increase physical activity (pt-stated)      Treadmill or stationary bike for exercise       This is a list of the screening recommended for you and due dates:  Health Maintenance  Topic Date Due  . Tetanus Vaccine  Never done  . Mammogram  07/10/2019  . Flu Shot  08/10/2019  . Complete foot exam   10/21/2019  . Hemoglobin A1C  12/07/2019  . Eye exam for diabetics  02/13/2020  . DEXA scan (bone density measurement)  Completed  . COVID-19 Vaccine  Completed  . Pneumonia vaccines  Completed

## 2019-06-23 NOTE — Progress Notes (Signed)
I have reviewed the above note and agree.  Stacee Earp, M.D.  

## 2019-07-11 ENCOUNTER — Ambulatory Visit
Admission: RE | Admit: 2019-07-11 | Discharge: 2019-07-11 | Disposition: A | Discharge status: Home / Self Care | Payer: Medicare Other | Source: Ambulatory Visit | Attending: Internal Medicine | Admitting: Internal Medicine

## 2019-07-11 DIAGNOSIS — Z1231 Encounter for screening mammogram for malignant neoplasm of breast: Secondary | ICD-10-CM | POA: Diagnosis present

## 2019-07-12 ENCOUNTER — Other Ambulatory Visit: Payer: Self-pay | Admitting: Family

## 2019-07-22 ENCOUNTER — Other Ambulatory Visit (INDEPENDENT_AMBULATORY_CARE_PROVIDER_SITE_OTHER): Payer: Medicare Other

## 2019-07-22 ENCOUNTER — Other Ambulatory Visit: Payer: Self-pay

## 2019-07-22 DIAGNOSIS — E7800 Pure hypercholesterolemia, unspecified: Secondary | ICD-10-CM | POA: Diagnosis not present

## 2019-07-22 DIAGNOSIS — E1165 Type 2 diabetes mellitus with hyperglycemia: Secondary | ICD-10-CM

## 2019-07-22 DIAGNOSIS — D509 Iron deficiency anemia, unspecified: Secondary | ICD-10-CM

## 2019-07-22 LAB — HEPATIC FUNCTION PANEL
ALT: 15 U/L (ref 0–35)
AST: 14 U/L (ref 0–37)
Albumin: 4.4 g/dL (ref 3.5–5.2)
Alkaline Phosphatase: 43 U/L (ref 39–117)
Bilirubin, Direct: 0.1 mg/dL (ref 0.0–0.3)
Total Bilirubin: 0.4 mg/dL (ref 0.2–1.2)
Total Protein: 7.4 g/dL (ref 6.0–8.3)

## 2019-08-25 ENCOUNTER — Other Ambulatory Visit: Payer: Self-pay | Admitting: Internal Medicine

## 2019-10-09 ENCOUNTER — Other Ambulatory Visit (INDEPENDENT_AMBULATORY_CARE_PROVIDER_SITE_OTHER): Payer: Medicare Other

## 2019-10-09 ENCOUNTER — Other Ambulatory Visit: Payer: Self-pay

## 2019-10-09 DIAGNOSIS — D509 Iron deficiency anemia, unspecified: Secondary | ICD-10-CM | POA: Diagnosis not present

## 2019-10-09 DIAGNOSIS — Z23 Encounter for immunization: Secondary | ICD-10-CM

## 2019-10-09 DIAGNOSIS — E1165 Type 2 diabetes mellitus with hyperglycemia: Secondary | ICD-10-CM

## 2019-10-09 LAB — CBC WITH DIFFERENTIAL/PLATELET
Basophils Absolute: 0.1 10*3/uL (ref 0.0–0.1)
Basophils Relative: 1.4 % (ref 0.0–3.0)
Eosinophils Absolute: 0.2 10*3/uL (ref 0.0–0.7)
Eosinophils Relative: 2.2 % (ref 0.0–5.0)
HCT: 32.5 % — ABNORMAL LOW (ref 36.0–46.0)
Hemoglobin: 10.3 g/dL — ABNORMAL LOW (ref 12.0–15.0)
Lymphocytes Relative: 21.3 % (ref 12.0–46.0)
Lymphs Abs: 1.7 10*3/uL (ref 0.7–4.0)
MCHC: 31.6 g/dL (ref 30.0–36.0)
MCV: 78.7 fl (ref 78.0–100.0)
Monocytes Absolute: 0.7 10*3/uL (ref 0.1–1.0)
Monocytes Relative: 8.8 % (ref 3.0–12.0)
Neutro Abs: 5.2 10*3/uL (ref 1.4–7.7)
Neutrophils Relative %: 66.3 % (ref 43.0–77.0)
Platelets: 268 10*3/uL (ref 150.0–400.0)
RBC: 4.13 Mil/uL (ref 3.87–5.11)
RDW: 17.2 % — ABNORMAL HIGH (ref 11.5–15.5)
WBC: 7.9 10*3/uL (ref 4.0–10.5)

## 2019-10-09 LAB — HEMOGLOBIN A1C: Hgb A1c MFr Bld: 6.9 % — ABNORMAL HIGH (ref 4.6–6.5)

## 2019-10-09 LAB — IBC + FERRITIN
Ferritin: 140.8 ng/mL (ref 10.0–291.0)
Iron: 58 ug/dL (ref 42–145)
Saturation Ratios: 20.2 % (ref 20.0–50.0)
Transferrin: 205 mg/dL — ABNORMAL LOW (ref 212.0–360.0)

## 2019-10-13 ENCOUNTER — Other Ambulatory Visit: Payer: Self-pay | Admitting: Internal Medicine

## 2019-10-13 ENCOUNTER — Encounter: Payer: Medicare Other | Admitting: Internal Medicine

## 2019-10-16 ENCOUNTER — Ambulatory Visit (INDEPENDENT_AMBULATORY_CARE_PROVIDER_SITE_OTHER): Payer: Medicare Other | Admitting: Internal Medicine

## 2019-10-16 ENCOUNTER — Other Ambulatory Visit: Payer: Self-pay

## 2019-10-16 DIAGNOSIS — D509 Iron deficiency anemia, unspecified: Secondary | ICD-10-CM

## 2019-10-16 DIAGNOSIS — Z0000 Encounter for general adult medical examination without abnormal findings: Secondary | ICD-10-CM | POA: Diagnosis not present

## 2019-10-16 DIAGNOSIS — F439 Reaction to severe stress, unspecified: Secondary | ICD-10-CM | POA: Diagnosis not present

## 2019-10-16 DIAGNOSIS — E039 Hypothyroidism, unspecified: Secondary | ICD-10-CM

## 2019-10-16 DIAGNOSIS — E7800 Pure hypercholesterolemia, unspecified: Secondary | ICD-10-CM

## 2019-10-16 DIAGNOSIS — E1165 Type 2 diabetes mellitus with hyperglycemia: Secondary | ICD-10-CM

## 2019-10-16 DIAGNOSIS — I10 Essential (primary) hypertension: Secondary | ICD-10-CM

## 2019-10-16 MED ORDER — EZETIMIBE 10 MG PO TABS: 10.0000 mg | ORAL_TABLET | Freq: Every day | ORAL | 1 refills | Status: DC

## 2019-10-16 NOTE — Assessment & Plan Note (Signed)
Physical today 10/16/19.  Colonoscopy 11/2017.  Mammogram 07/11/19 - Birads I.

## 2019-10-16 NOTE — Progress Notes (Signed)
[name], female   DOB: [birth year], 84 y.o.   MRN: [id number]   Subjective:    [name], female    DOB: 1935-06-10, 84 y.o.   MRN: [id number]  HPI This visit occurred during the SARS-CoV-2 public health emergency.  Safety protocols were in place, including screening questions prior to the visit, additional usage of staff PPE, and extensive cleaning of exam room while observing appropriate contact time as indicated for disinfecting solutions.  Patient here for her physical exam.  She reports she is doing relatively well.  Tries to stay active.  No chest pain or sob reported.  No abdominal pain.  Bowels moving.  Evaluated by endocrinology recently for f/u thyroid nodule.  Previous biopsy - negative.  Plan to repeat thyroid ultrasound 06/2020.  Recently gave her a trial of low dose crestor - to see if could tolerate.  Had problems with increased aching in her legs.  Changed back to zetia.  Off crestor.  Has been off since end of 09/2019.  Had flu and covid vaccines.    Past Medical History:  Diagnosis Date  . Anemia   . Diabetes mellitus ([place])   . GERD (gastroesophageal reflux disease)   . Hypercholesterolemia   . Hypertension   . Hypothyroidism   . Osteoporosis    Past Surgical History:  Procedure Laterality Date  . COLONOSCOPY    . COLONOSCOPY WITH PROPOFOL N/A 11/09/2017   Procedure: COLONOSCOPY WITH PROPOFOL;  Surgeon: Lollie Sails, MD;  Location: [facility] ENDOSCOPY;  Service: Endoscopy;  Laterality: N/A;  . CYST EXCISION     low back  . ESOPHAGOGASTRODUODENOSCOPY (EGD) WITH PROPOFOL N/A 11/09/2017   Procedure: ESOPHAGOGASTRODUODENOSCOPY (EGD) WITH PROPOFOL;  Surgeon: Lollie Sails, MD;  Location: [facility] ENDOSCOPY;  Service: Endoscopy;  Laterality: N/A;  . THYROID SURGERY  1992   left hemi-thyroidectomy (colloid goiter0  . UPPER GASTROINTESTINAL ENDOSCOPY     Family History  Problem Relation Age of Onset  . Emphysema Father   . Heart disease  Mother        myocardial infarction (69)  . Hypertension Mother   . Diabetes Mother   . Heart disease Brother        drug use  . Diabetes Mellitus II Sister        x3  . Hypertension Sister   . Breast cancer Other        niece  . Breast cancer Sister   . Cancer Sister   . Colon cancer Neg Hx    Social History   Socioeconomic History  . Marital status: Married    Spouse name: Not on file  . Number of children: 2  . Years of education: Not on file  . Highest education level: Not on file  Occupational History  . Not on file  Tobacco Use  . Smoking status: Never Smoker  . Smokeless tobacco: Never Used  Vaping Use  . Vaping Use: Never used  Substance and Sexual Activity  . Alcohol use: No    Alcohol/week: 0.0 standard drinks    Comment: occasional  . Drug use: Never  . Sexual activity: Not on file  Other Topics Concern  . Not on file  Social History Narrative   In [place]; with husband; never smoked;  no alcohol; telephone receptionist.    Social Determinants of Health   Financial Resource Strain:   . Difficulty of Paying Living Expenses: Not on file  Food Insecurity:   . Worried About  Running Out of Food in the Last Year: Not on file  . Ran Out of Food in the Last Year: Not on file  Transportation Needs:   . Lack of Transportation (Medical): Not on file  . Lack of Transportation (Non-Medical): Not on file  Physical Activity:   . Days of Exercise per Week: Not on file  . Minutes of Exercise per Session: Not on file  Stress:   . Feeling of Stress : Not on file  Social Connections:   . Frequency of Communication with Friends and Family: Not on file  . Frequency of Social Gatherings with Friends and Family: Not on file  . Attends Religious Services: Not on file  . Active Member of Clubs or Organizations: Not on file  . Attends Archivist Meetings: Not on file  . Marital Status: Not on file    Outpatient Encounter Medications as of 10/16/2019    Medication Sig  . ezetimibe (ZETIA) 10 MG tablet Take 1 tablet (10 mg total) by mouth daily.  . [DISCONTINUED] ezetimibe (ZETIA) 10 MG tablet TAKE 1 TABLET BY MOUTH EVERY DAY  . ACCU-CHEK AVIVA PLUS test strip USE TO CHECK BLOOD SUGAR TWICE A DAY  . ACCU-CHEK SOFTCLIX LANCETS lancets CHECK BLOOD SUGAR ONCE A DAY DX E11.9  . albuterol (PROVENTIL HFA;VENTOLIN HFA) 108 (90 Base) MCG/ACT inhaler Inhale 2 puffs into the lungs every 6 (six) hours as needed for wheezing or shortness of breath.  Marland Kitchen aspirin 81 MG tablet Take 81 mg by mouth daily.  . calcium-vitamin D (OSCAL WITH D) 250-125 MG-UNIT tablet Take 1 tablet by mouth daily.  . carbamide peroxide (DEBROX) 6.5 % OTIC solution Place 5 drops into the left ear daily. Massage ear for approximately 5 minutes.  Marland Kitchen glipiZIDE (GLUCOTROL XL) 5 MG 24 hr tablet TAKE 1 TABLET DAILY  . lisinopril (ZESTRIL) 10 MG tablet TAKE 1 TABLET DAILY  . metFORMIN (GLUCOPHAGE) 500 MG tablet TAKE 2 TABLETS TWICE A DAY  . Multiple Vitamin (MULTIVITAMIN) tablet Take 1 tablet by mouth daily.  Marland Kitchen omeprazole (PRILOSEC) 20 MG capsule TAKE 1 CAPSULE DAILY  . [DISCONTINUED] rosuvastatin (CRESTOR) 5 MG tablet TAKE ONE TABLET 2 DAYS PER WEEK.   No facility-administered encounter medications on file as of 10/16/2019.    Review of Systems  Constitutional: Negative for appetite change and unexpected weight change.  HENT: Negative for congestion, sinus pressure and sore throat.   Eyes: Negative for pain and visual disturbance.  Respiratory: Negative for cough, chest tightness and shortness of breath.   Cardiovascular: Negative for chest pain, palpitations and leg swelling.  Gastrointestinal: Negative for abdominal pain, diarrhea, nausea and vomiting.  Genitourinary: Negative for difficulty urinating and dysuria.  Musculoskeletal: Negative for joint swelling and myalgias.  Skin: Negative for color change and rash.  Neurological: Negative for dizziness, light-headedness and  headaches.  Hematological: Negative for adenopathy. Does not bruise/bleed easily.  Psychiatric/Behavioral: Negative for agitation and dysphoric mood.       Objective:    Physical Exam Vitals reviewed.  Constitutional:      General: She is not in acute distress.    Appearance: Normal appearance. She is well-developed.  HENT:     Head: Normocephalic and atraumatic.     Right Ear: External ear normal.     Left Ear: External ear normal.  Eyes:     General: No scleral icterus.       Right eye: No discharge.        Left eye: No  discharge.     Conjunctiva/sclera: Conjunctivae normal.  Neck:     Thyroid: No thyromegaly.  Cardiovascular:     Rate and Rhythm: Normal rate and regular rhythm.  Pulmonary:     Effort: No tachypnea, accessory muscle usage or respiratory distress.     Breath sounds: Normal breath sounds. No decreased breath sounds or wheezing.  Chest:     Breasts:        Right: No inverted nipple, mass, nipple discharge or tenderness (no axillary adenopathy).        Left: No inverted nipple, mass, nipple discharge or tenderness (no axilarry adenopathy).  Abdominal:     General: Bowel sounds are normal.     Palpations: Abdomen is soft.     Tenderness: There is no abdominal tenderness.  Musculoskeletal:        General: No swelling or tenderness.     Cervical back: Neck supple. No tenderness.  Lymphadenopathy:     Cervical: No cervical adenopathy.  Skin:    Findings: No erythema or rash.  Neurological:     Mental Status: She is alert and oriented to person, place, and time.  Psychiatric:        Mood and Affect: Mood normal.        Behavior: Behavior normal.     BP 124/70   Pulse 80   Temp 98.5 F (36.9 C) (Oral)   Resp 16   Ht _0  (1.6 m)   Wt 137 lb 12.8 oz (62.5 kg)   SpO2 98%   BMI 24.41 kg/m  Wt Readings from Last 3 Encounters:  10/16/19 137 lb 12.8 oz (62.5 kg)  06/23/19 137 lb (62.1 kg)  06/06/19 137 lb (62.1 kg)     Lab Results  Component  Value Date   WBC 7.9 10/09/2019   HGB 10.3 (L) 10/09/2019   HCT 32.5 (L) 10/09/2019   PLT 268.0 10/09/2019   GLUCOSE 136 (H) 06/04/2019   CHOL 174 06/04/2019   TRIG 88.0 06/04/2019   HDL 44.60 06/04/2019   LDLCALC 112 (H) 06/04/2019   ALT 15 07/22/2019   AST 14 07/22/2019   NA 136 06/04/2019   K 4.3 06/04/2019   CL 101 06/04/2019   CREATININE 0.80 06/04/2019   BUN 22 06/04/2019   CO2 26 06/04/2019   TSH 0.83 06/06/2019   HGBA1C 6.9 (H) 10/09/2019   MICROALBUR 1.6 02/14/2019    MM 3D SCREEN BREAST BILATERAL  Result Date: 07/11/2019 CLINICAL DATA:  Screening. EXAM: DIGITAL SCREENING BILATERAL MAMMOGRAM WITH TOMO AND CAD COMPARISON:  Previous exam(s). ACR Breast Density Category b: There are scattered areas of fibroglandular density. FINDINGS: There are no findings suspicious for malignancy. Images were processed with CAD. IMPRESSION: No mammographic evidence of malignancy. A result letter of this screening mammogram will be mailed directly to the patient. RECOMMENDATION: Screening mammogram in one year. (Code:SM-B-01Y) BI-RADS CATEGORY  1: Negative. Electronically Signed   By: Audie Pinto M.D.   On: 07/11/2019 15:04       Assessment & Plan:   Problem List Items Addressed This Visit    Stress    Overall appears to be handling things well.  Follow.       Microcytic anemia    Has a history of chronic microcytic anemia.  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  Follow cbc.  If hgb <9 - refer to hematology for further w/up.  10/09/19 hgb - 10.3.        Relevant Orders   CBC with  Differential/Platelet   Hypothyroidism    History of hypothyroidism and thyroid nodules.  Follow tsh.       Relevant Orders   T4, free   TSH   Hypertension    Blood pressure as outlined.  Continue lisinopril.  Follow pressures.  Follow metabolic panel.       Relevant Medications   ezetimibe (ZETIA) 10 MG tablet   Hypercholesterolemia    Recent trial crestor.  Had leg aching.  Off now.  Restart  zetia.  Low cholesterol diet and exercise.  Follow lipid panel.       Relevant Medications   ezetimibe (ZETIA) 10 MG tablet   Other Relevant Orders   Hepatic function panel   Lipid panel   Health care maintenance    Physical today 10/16/19.  Colonoscopy 11/2017.  Mammogram 07/11/19 - Birads I.        Diabetes mellitus ([place])    Low carb diet and exercise. On metformin and glipizide.  Follow met b and a1c.       Relevant Orders   Hemoglobin P5X   Basic metabolic panel       Einar Pheasant, MD

## 2019-10-26 ENCOUNTER — Encounter: Payer: Self-pay | Admitting: Internal Medicine

## 2019-10-26 NOTE — Assessment & Plan Note (Signed)
Recent trial crestor.  Had leg aching.  Off now.  Restart zetia.  Low cholesterol diet and exercise.  Follow lipid panel.

## 2019-10-26 NOTE — Assessment & Plan Note (Signed)
Has a history of chronic microcytic anemia.  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  Follow cbc.  If hgb <9 - refer to hematology for further w/up.  10/09/19 hgb - 10.3.

## 2019-10-26 NOTE — Assessment & Plan Note (Signed)
Blood pressure as outlined.  Continue lisinopril.  Follow pressures.  Follow metabolic panel.   

## 2019-10-26 NOTE — Assessment & Plan Note (Signed)
History of hypothyroidism and thyroid nodules.  Follow tsh.

## 2019-10-26 NOTE — Assessment & Plan Note (Signed)
Overall appears to be handling things well.  Follow.  ?

## 2019-10-26 NOTE — Assessment & Plan Note (Signed)
Low carb diet and exercise. On metformin and glipizide.  Follow met b and a1c.

## 2019-11-16 ENCOUNTER — Other Ambulatory Visit: Payer: Self-pay | Admitting: Internal Medicine

## 2020-01-14 ENCOUNTER — Other Ambulatory Visit: Payer: Self-pay

## 2020-01-14 ENCOUNTER — Other Ambulatory Visit (INDEPENDENT_AMBULATORY_CARE_PROVIDER_SITE_OTHER): Payer: Medicare Other

## 2020-01-14 DIAGNOSIS — E1165 Type 2 diabetes mellitus with hyperglycemia: Secondary | ICD-10-CM | POA: Diagnosis not present

## 2020-01-14 DIAGNOSIS — E7800 Pure hypercholesterolemia, unspecified: Secondary | ICD-10-CM

## 2020-01-14 DIAGNOSIS — E039 Hypothyroidism, unspecified: Secondary | ICD-10-CM | POA: Diagnosis not present

## 2020-01-14 DIAGNOSIS — D509 Iron deficiency anemia, unspecified: Secondary | ICD-10-CM

## 2020-01-14 LAB — HEPATIC FUNCTION PANEL
ALT: 11 U/L (ref 0–35)
AST: 12 U/L (ref 0–37)
Albumin: 4.4 g/dL (ref 3.5–5.2)
Alkaline Phosphatase: 41 U/L (ref 39–117)
Bilirubin, Direct: 0.1 mg/dL (ref 0.0–0.3)
Total Bilirubin: 0.5 mg/dL (ref 0.2–1.2)
Total Protein: 7.4 g/dL (ref 6.0–8.3)

## 2020-01-14 LAB — LIPID PANEL
Cholesterol: 173 mg/dL (ref 0–200)
HDL: 49.8 mg/dL (ref >39.00)
LDL Cholesterol: 108 mg/dL — ABNORMAL HIGH (ref 0–99)
NonHDL: 123.08
Total CHOL/HDL Ratio: 3
Triglycerides: 74 mg/dL (ref 0.0–149.0)
VLDL: 14.8 mg/dL (ref 0.0–40.0)

## 2020-01-14 LAB — BASIC METABOLIC PANEL
BUN: 18 mg/dL (ref 6–23)
CO2: 28 mEq/L (ref 19–32)
Calcium: 9.6 mg/dL (ref 8.4–10.5)
Chloride: 103 mEq/L (ref 96–112)
Creatinine, Ser: 0.88 mg/dL (ref 0.40–1.20)
GFR: 61.05 mL/min (ref >60.00)
Glucose, Bld: 115 mg/dL — ABNORMAL HIGH (ref 70–99)
Potassium: 4.6 mEq/L (ref 3.5–5.1)
Sodium: 137 mEq/L (ref 135–145)

## 2020-01-14 LAB — HEMOGLOBIN A1C: Hgb A1c MFr Bld: 6.6 % — ABNORMAL HIGH (ref 4.6–6.5)

## 2020-01-14 LAB — CBC WITH DIFFERENTIAL/PLATELET
Basophils Absolute: 0.1 10*3/uL (ref 0.0–0.1)
Basophils Relative: 0.8 % (ref 0.0–3.0)
Eosinophils Absolute: 0.1 10*3/uL (ref 0.0–0.7)
Eosinophils Relative: 1.3 % (ref 0.0–5.0)
HCT: 34.3 % — ABNORMAL LOW (ref 36.0–46.0)
Hemoglobin: 11 g/dL — ABNORMAL LOW (ref 12.0–15.0)
Lymphocytes Relative: 27.1 % (ref 12.0–46.0)
Lymphs Abs: 2.2 10*3/uL (ref 0.7–4.0)
MCHC: 32 g/dL (ref 30.0–36.0)
MCV: 77.9 fl — ABNORMAL LOW (ref 78.0–100.0)
Monocytes Absolute: 0.6 10*3/uL (ref 0.1–1.0)
Monocytes Relative: 7.6 % (ref 3.0–12.0)
Neutro Abs: 5.1 10*3/uL (ref 1.4–7.7)
Neutrophils Relative %: 63.2 % (ref 43.0–77.0)
Platelets: 274 10*3/uL (ref 150.0–400.0)
RBC: 4.4 Mil/uL (ref 3.87–5.11)
RDW: 17.1 % — ABNORMAL HIGH (ref 11.5–15.5)
WBC: 8.1 10*3/uL (ref 4.0–10.5)

## 2020-01-14 LAB — TSH: TSH: 0.59 u[IU]/mL (ref 0.35–4.50)

## 2020-01-14 LAB — T4, FREE: Free T4: 0.81 ng/dL (ref 0.60–1.60)

## 2020-01-16 ENCOUNTER — Other Ambulatory Visit: Payer: Self-pay

## 2020-01-16 ENCOUNTER — Ambulatory Visit: Payer: Medicare Other | Admitting: Internal Medicine

## 2020-01-16 DIAGNOSIS — D509 Iron deficiency anemia, unspecified: Secondary | ICD-10-CM

## 2020-01-16 DIAGNOSIS — F439 Reaction to severe stress, unspecified: Secondary | ICD-10-CM | POA: Diagnosis not present

## 2020-01-16 DIAGNOSIS — E1165 Type 2 diabetes mellitus with hyperglycemia: Secondary | ICD-10-CM

## 2020-01-16 DIAGNOSIS — R202 Paresthesia of skin: Secondary | ICD-10-CM

## 2020-01-16 DIAGNOSIS — E7800 Pure hypercholesterolemia, unspecified: Secondary | ICD-10-CM

## 2020-01-16 DIAGNOSIS — R200 Anesthesia of skin: Secondary | ICD-10-CM

## 2020-01-16 DIAGNOSIS — I10 Essential (primary) hypertension: Secondary | ICD-10-CM | POA: Diagnosis not present

## 2020-01-16 DIAGNOSIS — R109 Unspecified abdominal pain: Secondary | ICD-10-CM

## 2020-01-16 NOTE — Progress Notes (Signed)
[name], female   DOB: [birth year], 85 y.o.   MRN: [id number]   Subjective:    [name], female    DOB: 1935/11/19, 85 y.o.   MRN: [id number]  HPI This visit occurred during the SARS-CoV-2 public health emergency.  Safety protocols were in place, including screening questions prior to the visit, additional usage of staff PPE, and extensive cleaning of exam room while observing appropriate contact time as indicated for disinfecting solutions.  Patient here for a scheduled follow up.  Here to follow up regarding her blood sugar and cholesterol.  She reports she is doing relatively well.  Tries to stay active.  No chest pain or sob reported.  No abdominal pain or bowel change reported.  Has noticed left side pain/chest - under axilla.  States occurred after sitting in a strained position for an extended period of time.  Was hurting to reach out or with certain movements.  Is better now.  Pain reproducible on palpation.  Using heating pad.  Improved.  No increased cough or chest congestion.  Does report some numbness - hand.  No weakness.  Discussed using wrist splints.    Past Medical History:  Diagnosis Date  . Anemia   . Diabetes mellitus ([place])   . GERD (gastroesophageal reflux disease)   . Hypercholesterolemia   . Hypertension   . Hypothyroidism   . Osteoporosis    Past Surgical History:  Procedure Laterality Date  . COLONOSCOPY    . COLONOSCOPY WITH PROPOFOL N/A 11/09/2017   Procedure: COLONOSCOPY WITH PROPOFOL;  Surgeon: Lollie Sails, MD;  Location: [facility] ENDOSCOPY;  Service: Endoscopy;  Laterality: N/A;  . CYST EXCISION     low back  . ESOPHAGOGASTRODUODENOSCOPY (EGD) WITH PROPOFOL N/A 11/09/2017   Procedure: ESOPHAGOGASTRODUODENOSCOPY (EGD) WITH PROPOFOL;  Surgeon: Lollie Sails, MD;  Location: [facility] ENDOSCOPY;  Service: Endoscopy;  Laterality: N/A;  . THYROID SURGERY  1992   left hemi-thyroidectomy (colloid goiter0  . UPPER  GASTROINTESTINAL ENDOSCOPY     Family History  Problem Relation Age of Onset  . Emphysema Father   . Heart disease Mother        myocardial infarction (69)  . Hypertension Mother   . Diabetes Mother   . Heart disease Brother        drug use  . Diabetes Mellitus II Sister        x3  . Hypertension Sister   . Breast cancer Other        niece  . Breast cancer Sister   . Cancer Sister   . Colon cancer Neg Hx    Social History   Socioeconomic History  . Marital status: Married    Spouse name: Not on file  . Number of children: 2  . Years of education: Not on file  . Highest education level: Not on file  Occupational History  . Not on file  Tobacco Use  . Smoking status: Never Smoker  . Smokeless tobacco: Never Used  Vaping Use  . Vaping Use: Never used  Substance and Sexual Activity  . Alcohol use: No    Alcohol/week: 0.0 standard drinks    Comment: occasional  . Drug use: Never  . Sexual activity: Not on file  Other Topics Concern  . Not on file  Social History Narrative   In [place]; with husband; never smoked;  no alcohol; telephone receptionist.    Social Determinants of Health   Financial Resource Strain: Not on file  Food Insecurity: Not on file  Transportation Needs: Not on file  Physical Activity: Not on file  Stress: Not on file  Social Connections: Not on file    Outpatient Encounter Medications as of 01/16/2020  Medication Sig  . ACCU-CHEK AVIVA PLUS test strip USE TO CHECK BLOOD SUGAR TWICE A DAY  . ACCU-CHEK SOFTCLIX LANCETS lancets CHECK BLOOD SUGAR ONCE A DAY DX E11.9  . albuterol (PROVENTIL HFA;VENTOLIN HFA) 108 (90 Base) MCG/ACT inhaler Inhale 2 puffs into the lungs every 6 (six) hours as needed for wheezing or shortness of breath.  Marland Kitchen aspirin 81 MG tablet Take 81 mg by mouth daily.  . calcium-vitamin D (OSCAL WITH D) 250-125 MG-UNIT tablet Take 1 tablet by mouth daily.  . carbamide peroxide (DEBROX) 6.5 % OTIC solution Place 5 drops into the  left ear daily. Massage ear for approximately 5 minutes.  Marland Kitchen ezetimibe (ZETIA) 10 MG tablet Take 1 tablet (10 mg total) by mouth daily.  Marland Kitchen glipiZIDE (GLUCOTROL XL) 5 MG 24 hr tablet TAKE 1 TABLET DAILY  . lisinopril (ZESTRIL) 10 MG tablet TAKE 1 TABLET DAILY  . metFORMIN (GLUCOPHAGE) 500 MG tablet TAKE 2 TABLETS TWICE A DAY  . Multiple Vitamin (MULTIVITAMIN) tablet Take 1 tablet by mouth daily.  Marland Kitchen omeprazole (PRILOSEC) 20 MG capsule TAKE 1 CAPSULE DAILY   No facility-administered encounter medications on file as of 01/16/2020.    Review of Systems  Constitutional: Negative for appetite change and unexpected weight change.  HENT: Negative for congestion and sinus pressure.   Respiratory: Negative for cough, chest tightness and shortness of breath.   Cardiovascular: Negative for chest pain, palpitations and leg swelling.  Gastrointestinal: Negative for abdominal pain, diarrhea, nausea and vomiting.  Genitourinary: Negative for difficulty urinating and dysuria.  Musculoskeletal: Negative for joint swelling and myalgias.  Skin: Negative for color change and rash.  Neurological: Negative for dizziness, light-headedness and headaches.  Psychiatric/Behavioral: Negative for agitation and dysphoric mood.       Objective:    Physical Exam Vitals reviewed.  Constitutional:      General: She is not in acute distress.    Appearance: Normal appearance.  HENT:     Head: Normocephalic and atraumatic.     Right Ear: External ear normal.     Left Ear: External ear normal.     Mouth/Throat:     Mouth: Oropharynx is clear and moist.  Eyes:     General: No scleral icterus.       Right eye: No discharge.        Left eye: No discharge.     Conjunctiva/sclera: Conjunctivae normal.  Neck:     Thyroid: No thyromegaly.  Cardiovascular:     Rate and Rhythm: Normal rate and regular rhythm.  Pulmonary:     Effort: No respiratory distress.     Breath sounds: Normal breath sounds. No wheezing.   Abdominal:     General: Bowel sounds are normal.     Palpations: Abdomen is soft.     Tenderness: There is no abdominal tenderness.  Musculoskeletal:        General: No swelling, tenderness or edema.     Cervical back: Neck supple. No tenderness.     Comments: Reproducible - on exam - pain left side - under left axilla.   Lymphadenopathy:     Cervical: No cervical adenopathy.  Skin:    Findings: No erythema or rash.  Neurological:     Mental Status: She is alert.  Psychiatric:  Mood and Affect: Mood normal.        Behavior: Behavior normal.     BP 120/70   Pulse 79   Temp 98.1 F (36.7 C) (Oral)   Resp 16   Ht _0  (1.6 m)   Wt 138 lb (62.6 kg)   SpO2 99%   BMI 24.45 kg/m  Wt Readings from Last 3 Encounters:  01/16/20 138 lb (62.6 kg)  10/16/19 137 lb 12.8 oz (62.5 kg)  06/23/19 137 lb (62.1 kg)     Lab Results  Component Value Date   WBC 8.1 01/14/2020   HGB 11.0 (L) 01/14/2020   HCT 34.3 (L) 01/14/2020   PLT 274.0 01/14/2020   GLUCOSE 115 (H) 01/14/2020   CHOL 173 01/14/2020   TRIG 74.0 01/14/2020   HDL 49.80 01/14/2020   LDLCALC 108 (H) 01/14/2020   ALT 11 01/14/2020   AST 12 01/14/2020   NA 137 01/14/2020   K 4.6 01/14/2020   CL 103 01/14/2020   CREATININE 0.88 01/14/2020   BUN 18 01/14/2020   CO2 28 01/14/2020   TSH 0.59 01/14/2020   HGBA1C 6.6 (H) 01/14/2020   MICROALBUR 1.6 02/14/2019    MM 3D SCREEN BREAST BILATERAL  Result Date: 07/11/2019 CLINICAL DATA:  Screening. EXAM: DIGITAL SCREENING BILATERAL MAMMOGRAM WITH TOMO AND CAD COMPARISON:  Previous exam(s). ACR Breast Density Category b: There are scattered areas of fibroglandular density. FINDINGS: There are no findings suspicious for malignancy. Images were processed with CAD. IMPRESSION: No mammographic evidence of malignancy. A result letter of this screening mammogram will be mailed directly to the patient. RECOMMENDATION: Screening mammogram in one year. (Code:SM-B-01Y) BI-RADS  CATEGORY  1: Negative. Electronically Signed   By: Audie Pinto M.D.   On: 07/11/2019 15:04       Assessment & Plan:   Problem List Items Addressed This Visit    Diabetes mellitus ([place])    On metformin and glipizide.  a1c 01/14/20 - 6.5.  Low carb diet and exercise.  Follow met b and a1c.       Relevant Orders   Hemoglobin A1c   Hypertension    Blood pressure doing well.  Continue on lisinopril.  Follow pressures.  Follow metabolic panel.        Relevant Orders   TSH   T4, free   Basic metabolic panel   Hypercholesterolemia    Had leg aching on crestor.  On zetia.  Cholesterol labs reviewed.  Discussed repatha.  Low choelsterol diet and exercise.  Follow lipid panel.       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  Follow cbc.  Recent check improved.       Numbness and tingling    Has had MRI and NCS - severe bilateral lower extremity polyneuropathy.  Discussed wearing splints.  Discussed upper extremity NCS.  She wants to follow.  Try splints.  If persistent problems, will notify me.       Stress    Overall appears to be handling things well. Does not feel needs any further intervention.  Follow.       Side pain    Left side pain as outlined.  Reproducible on exam. Improved.  Heating pad.  Since improving.  Follow.  If does not completely resolve, will notify me.           Einar Pheasant, MD

## 2020-01-18 ENCOUNTER — Encounter: Payer: Self-pay | Admitting: Internal Medicine

## 2020-01-18 DIAGNOSIS — R109 Unspecified abdominal pain: Secondary | ICD-10-CM | POA: Insufficient documentation

## 2020-01-18 NOTE — Assessment & Plan Note (Signed)
Left side pain as outlined.  Reproducible on exam. Improved.  Heating pad.  Since improving.  Follow.  If does not completely resolve, will notify me.

## 2020-01-18 NOTE — Assessment & Plan Note (Signed)
On metformin and glipizide.  a1c 01/14/20 - 6.5.  Low carb diet and exercise.  Follow met b and a1c.

## 2020-01-18 NOTE — Assessment & Plan Note (Signed)
Has had MRI and NCS - severe bilateral lower extremity polyneuropathy.  Discussed wearing splints.  Discussed upper extremity NCS.  She wants to follow.  Try splints.  If persistent problems, will notify me.

## 2020-01-18 NOTE — Assessment & Plan Note (Signed)
Blood pressure doing well.  Continue on lisinopril.  Follow pressures.  Follow metabolic panel.

## 2020-01-18 NOTE — Assessment & Plan Note (Signed)
Had leg aching on crestor.  On zetia.  Cholesterol labs reviewed.  Discussed repatha.  Low choelsterol diet and exercise.  Follow lipid panel.

## 2020-01-18 NOTE — Assessment & Plan Note (Signed)
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  Follow cbc.  Recent check improved.

## 2020-01-18 NOTE — Assessment & Plan Note (Signed)
Overall appears to be handling things well.  Does not feel needs any further intervention. Follow.  

## 2020-02-13 ENCOUNTER — Other Ambulatory Visit: Payer: Self-pay | Admitting: Internal Medicine

## 2020-02-26 LAB — HM DIABETES EYE EXAM

## 2020-05-06 ENCOUNTER — Other Ambulatory Visit: Payer: Self-pay | Admitting: Internal Medicine

## 2020-05-13 ENCOUNTER — Other Ambulatory Visit (INDEPENDENT_AMBULATORY_CARE_PROVIDER_SITE_OTHER): Payer: Medicare Other

## 2020-05-13 ENCOUNTER — Other Ambulatory Visit: Payer: Self-pay

## 2020-05-13 ENCOUNTER — Other Ambulatory Visit: Payer: Medicare Other

## 2020-05-13 DIAGNOSIS — I10 Essential (primary) hypertension: Secondary | ICD-10-CM | POA: Diagnosis not present

## 2020-05-13 DIAGNOSIS — E7800 Pure hypercholesterolemia, unspecified: Secondary | ICD-10-CM

## 2020-05-13 DIAGNOSIS — E1165 Type 2 diabetes mellitus with hyperglycemia: Secondary | ICD-10-CM

## 2020-05-13 LAB — HEPATIC FUNCTION PANEL
ALT: 15 U/L (ref 0–35)
AST: 15 U/L (ref 0–37)
Albumin: 4.4 g/dL (ref 3.5–5.2)
Alkaline Phosphatase: 42 U/L (ref 39–117)
Bilirubin, Direct: 0.1 mg/dL (ref 0.0–0.3)
Total Bilirubin: 0.4 mg/dL (ref 0.2–1.2)
Total Protein: 8 g/dL (ref 6.0–8.3)

## 2020-05-13 LAB — LIPID PANEL
Cholesterol: 149 mg/dL (ref 0–200)
HDL: 53 mg/dL (ref >39.00)
LDL Cholesterol: 83 mg/dL (ref 0–99)
NonHDL: 95.99
Total CHOL/HDL Ratio: 3
Triglycerides: 65 mg/dL (ref 0.0–149.0)
VLDL: 13 mg/dL (ref 0.0–40.0)

## 2020-05-13 LAB — BASIC METABOLIC PANEL
BUN: 24 mg/dL — ABNORMAL HIGH (ref 6–23)
CO2: 24 mEq/L (ref 19–32)
Calcium: 9.7 mg/dL (ref 8.4–10.5)
Chloride: 103 mEq/L (ref 96–112)
Creatinine, Ser: 0.86 mg/dL (ref 0.40–1.20)
GFR: 62.61 mL/min (ref >60.00)
Glucose, Bld: 110 mg/dL — ABNORMAL HIGH (ref 70–99)
Potassium: 4.2 mEq/L (ref 3.5–5.1)
Sodium: 136 mEq/L (ref 135–145)

## 2020-05-13 LAB — T4, FREE: Free T4: 0.78 ng/dL (ref 0.60–1.60)

## 2020-05-13 LAB — HEMOGLOBIN A1C: Hgb A1c MFr Bld: 6.7 % — ABNORMAL HIGH (ref 4.6–6.5)

## 2020-05-13 LAB — TSH: TSH: 0.73 u[IU]/mL (ref 0.35–4.50)

## 2020-05-17 ENCOUNTER — Ambulatory Visit: Payer: Medicare Other | Admitting: Internal Medicine

## 2020-05-17 ENCOUNTER — Encounter: Payer: Self-pay | Admitting: Internal Medicine

## 2020-05-17 ENCOUNTER — Telehealth: Payer: Self-pay | Admitting: Internal Medicine

## 2020-05-17 ENCOUNTER — Other Ambulatory Visit: Payer: Self-pay

## 2020-05-17 DIAGNOSIS — E1165 Type 2 diabetes mellitus with hyperglycemia: Secondary | ICD-10-CM

## 2020-05-17 DIAGNOSIS — E7800 Pure hypercholesterolemia, unspecified: Secondary | ICD-10-CM

## 2020-05-17 DIAGNOSIS — I10 Essential (primary) hypertension: Secondary | ICD-10-CM

## 2020-05-17 DIAGNOSIS — E039 Hypothyroidism, unspecified: Secondary | ICD-10-CM | POA: Diagnosis not present

## 2020-05-17 DIAGNOSIS — D509 Iron deficiency anemia, unspecified: Secondary | ICD-10-CM

## 2020-05-17 DIAGNOSIS — F439 Reaction to severe stress, unspecified: Secondary | ICD-10-CM | POA: Diagnosis not present

## 2020-05-17 LAB — HM DIABETES FOOT EXAM

## 2020-05-17 NOTE — Telephone Encounter (Signed)
Called and was informed of these dates. Documented in patient's chart,

## 2020-05-17 NOTE — Progress Notes (Signed)
[name], female   DOB: [birth year], 85 y.o.   MRN: [id number]   Subjective:    [name], female    DOB: 15-Aug-1935, 85 y.o.   MRN: [id number]  HPI This visit occurred during the SARS-CoV-2 public health emergency.  Safety protocols were in place, including screening questions prior to the visit, additional usage of staff PPE, and extensive cleaning of exam room while observing appropriate contact time as indicated for disinfecting solutions.  Patient here for a scheduled follow up. Here to follow up regarding her blood sugar and cholesterol.  She reports she is doing relatively well.  Discussed diet and exercise.  No chest pain or sob reported.  No abdominal pain or bowel change reported.  Handling stress. Discussed vaccines.  Had fourth covid vaccine.  Has also had shingles vaccine.   Past Medical History:  Diagnosis Date  . Anemia   . Diabetes mellitus ([place])   . GERD (gastroesophageal reflux disease)   . Hypercholesterolemia   . Hypertension   . Hypothyroidism   . Osteoporosis    Past Surgical History:  Procedure Laterality Date  . COLONOSCOPY    . COLONOSCOPY WITH PROPOFOL N/A 11/09/2017   Procedure: COLONOSCOPY WITH PROPOFOL;  Surgeon: Lollie Sails, MD;  Location: [facility] ENDOSCOPY;  Service: Endoscopy;  Laterality: N/A;  . CYST EXCISION     low back  . ESOPHAGOGASTRODUODENOSCOPY (EGD) WITH PROPOFOL N/A 11/09/2017   Procedure: ESOPHAGOGASTRODUODENOSCOPY (EGD) WITH PROPOFOL;  Surgeon: Lollie Sails, MD;  Location: [facility] ENDOSCOPY;  Service: Endoscopy;  Laterality: N/A;  . THYROID SURGERY  1992   left hemi-thyroidectomy (colloid goiter0  . UPPER GASTROINTESTINAL ENDOSCOPY     Family History  Problem Relation Age of Onset  . Emphysema Father   . Heart disease Mother        myocardial infarction (69)  . Hypertension Mother   . Diabetes Mother   . Heart disease Brother        drug use  . Diabetes Mellitus II Sister        x3  .  Hypertension Sister   . Breast cancer Other        niece  . Breast cancer Sister   . Cancer Sister   . Colon cancer Neg Hx    Social History   Socioeconomic History  . Marital status: Married    Spouse name: Not on file  . Number of children: 2  . Years of education: Not on file  . Highest education level: Not on file  Occupational History  . Not on file  Tobacco Use  . Smoking status: Never Smoker  . Smokeless tobacco: Never Used  Vaping Use  . Vaping Use: Never used  Substance and Sexual Activity  . Alcohol use: No    Alcohol/week: 0.0 standard drinks    Comment: occasional  . Drug use: Never  . Sexual activity: Not on file  Other Topics Concern  . Not on file  Social History Narrative   In [place]; with husband; never smoked;  no alcohol; telephone receptionist.    Social Determinants of Health   Financial Resource Strain: Not on file  Food Insecurity: Not on file  Transportation Needs: Not on file  Physical Activity: Not on file  Stress: Not on file  Social Connections: Not on file    Outpatient Encounter Medications as of 05/17/2020  Medication Sig  . ACCU-CHEK AVIVA PLUS test strip USE TO CHECK BLOOD SUGAR TWICE A DAY  .  ACCU-CHEK SOFTCLIX LANCETS lancets CHECK BLOOD SUGAR ONCE A DAY DX E11.9  . aspirin 81 MG tablet Take 81 mg by mouth daily.  . calcium-vitamin D (OSCAL WITH D) 250-125 MG-UNIT tablet Take 1 tablet by mouth daily.  Marland Kitchen ezetimibe (ZETIA) 10 MG tablet TAKE 1 TABLET BY MOUTH EVERY DAY  . lisinopril (ZESTRIL) 10 MG tablet TAKE 1 TABLET DAILY  . Multiple Vitamin (MULTIVITAMIN) tablet Take 1 tablet by mouth daily.  Marland Kitchen omeprazole (PRILOSEC) 20 MG capsule TAKE 1 CAPSULE DAILY  . [DISCONTINUED] glipiZIDE (GLUCOTROL XL) 5 MG 24 hr tablet TAKE 1 TABLET DAILY  . [DISCONTINUED] metFORMIN (GLUCOPHAGE) 500 MG tablet TAKE 2 TABLETS TWICE A DAY  . [DISCONTINUED] albuterol (PROVENTIL HFA;VENTOLIN HFA) 108 (90 Base) MCG/ACT inhaler Inhale 2 puffs into the lungs  every 6 (six) hours as needed for wheezing or shortness of breath. (Patient not taking: Reported on 05/17/2020)  . [DISCONTINUED] carbamide peroxide (DEBROX) 6.5 % OTIC solution Place 5 drops into the left ear daily. Massage ear for approximately 5 minutes. (Patient not taking: Reported on 05/17/2020)   No facility-administered encounter medications on file as of 05/17/2020.    Review of Systems  Constitutional: Negative for appetite change and unexpected weight change.  HENT: Negative for congestion and sinus pressure.   Respiratory: Negative for cough, chest tightness and shortness of breath.   Cardiovascular: Negative for chest pain, palpitations and leg swelling.  Gastrointestinal: Negative for abdominal pain, diarrhea, nausea and vomiting.  Genitourinary: Negative for difficulty urinating and dysuria.  Musculoskeletal: Negative for joint swelling and myalgias.  Skin: Negative for color change and rash.  Neurological: Negative for dizziness, light-headedness and headaches.  Psychiatric/Behavioral: Negative for agitation and dysphoric mood.       Objective:    Physical Exam Vitals reviewed.  Constitutional:      General: She is not in acute distress.    Appearance: Normal appearance.  HENT:     Head: Normocephalic and atraumatic.     Right Ear: External ear normal.     Left Ear: External ear normal.  Eyes:     General: No scleral icterus.       Right eye: No discharge.     Conjunctiva/sclera: Conjunctivae normal.  Neck:     Thyroid: No thyromegaly.  Cardiovascular:     Rate and Rhythm: Normal rate and regular rhythm.  Pulmonary:     Effort: No respiratory distress.     Breath sounds: Normal breath sounds. No wheezing.  Abdominal:     General: Bowel sounds are normal.     Palpations: Abdomen is soft.     Tenderness: There is no abdominal tenderness.  Musculoskeletal:        General: No swelling or tenderness.     Cervical back: Neck supple. No tenderness.   Lymphadenopathy:     Cervical: No cervical adenopathy.  Skin:    Findings: No erythema or rash.  Neurological:     Mental Status: She is alert.  Psychiatric:        Mood and Affect: Mood normal.        Behavior: Behavior normal.     BP 110/70 (BP Location: Left Arm, Patient Position: Sitting, Cuff Size: Normal)   Pulse 82   Temp 98.1 F (36.7 C) (Oral)   Ht _0  (1.6 m)   Wt 137 lb (62.1 kg)   SpO2 97%   BMI 24.27 kg/m  Wt Readings from Last 3 Encounters:  05/17/20 137 lb (62.1 kg)  01/16/20  138 lb (62.6 kg)  10/16/19 137 lb 12.8 oz (62.5 kg)     Lab Results  Component Value Date   WBC 8.1 01/14/2020   HGB 11.0 (L) 01/14/2020   HCT 34.3 (L) 01/14/2020   PLT 274.0 01/14/2020   GLUCOSE 110 (H) 05/13/2020   CHOL 149 05/13/2020   TRIG 65.0 05/13/2020   HDL 53.00 05/13/2020   LDLCALC 83 05/13/2020   ALT 15 05/13/2020   AST 15 05/13/2020   NA 136 05/13/2020   K 4.2 05/13/2020   CL 103 05/13/2020   CREATININE 0.86 05/13/2020   BUN 24 (H) 05/13/2020   CO2 24 05/13/2020   TSH 0.73 05/13/2020   HGBA1C 6.7 (H) 05/13/2020   MICROALBUR 1.6 02/14/2019    MM 3D SCREEN BREAST BILATERAL  Result Date: 07/11/2019 CLINICAL DATA:  Screening. EXAM: DIGITAL SCREENING BILATERAL MAMMOGRAM WITH TOMO AND CAD COMPARISON:  Previous exam(s). ACR Breast Density Category b: There are scattered areas of fibroglandular density. FINDINGS: There are no findings suspicious for malignancy. Images were processed with CAD. IMPRESSION: No mammographic evidence of malignancy. A result letter of this screening mammogram will be mailed directly to the patient. RECOMMENDATION: Screening mammogram in one year. (Code:SM-B-01Y) BI-RADS CATEGORY  1: Negative. Electronically Signed   By: Audie Pinto M.D.   On: 07/11/2019 15:04       Assessment & Plan:   Problem List Items Addressed This Visit    Diabetes mellitus ([place])    Continues metformin and glipizide.  No low sugars reported.  Low carb diet  and exercise given elevated blood sugars. Follow met b and a1c.       Relevant Orders   Hemoglobin A1c   Hypercholesterolemia    Intolerant to statin medication.  On zetia.  Low cholesterol diet and exercise.  Follow lipid panel.       Relevant Orders   Hepatic function panel   Lipid panel   Hypertension    Continue lisinopril.  Blood pressure doing well.  Follow pressures.  Follow metabolic panel.       Relevant Orders   Basic metabolic panel   Hypothyroidism    History of hypothyroidism and thyroid nodules.  Follow tsh.       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  Follow cbc.       Relevant Orders   CBC with Differential/Platelet   Ferritin   Stress    Overall appears to be handling things relatively well.  Does not feel needs any further intervention.  Follow.           Einar Pheasant, MD

## 2020-05-17 NOTE — Telephone Encounter (Signed)
Wanda Ruiz reported she had her shingles vaccine (both) and her 4th covid vaccine - [place].  Please get dates and abstract into chart.  Thanks

## 2020-05-20 ENCOUNTER — Other Ambulatory Visit: Payer: Self-pay | Admitting: Internal Medicine

## 2020-05-23 ENCOUNTER — Encounter: Payer: Self-pay | Admitting: Internal Medicine

## 2020-05-23 NOTE — Assessment & Plan Note (Signed)
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  Follow cbc.

## 2020-05-23 NOTE — Assessment & Plan Note (Signed)
Intolerant to statin medication. On zetia.  Low cholesterol diet and exercise.  Follow lipid panel.  

## 2020-05-23 NOTE — Assessment & Plan Note (Signed)
History of hypothyroidism and thyroid nodules.  Follow tsh.  

## 2020-05-23 NOTE — Assessment & Plan Note (Signed)
Overall appears to be handling things relatively well.  Does not feel needs any further intervention.  Follow.

## 2020-05-23 NOTE — Assessment & Plan Note (Signed)
Continue lisinopril.  Blood pressure doing well.  Follow pressures.  Follow metabolic panel.  

## 2020-05-23 NOTE — Assessment & Plan Note (Addendum)
Continues metformin and glipizide.  No low sugars reported.  Low carb diet and exercise given elevated blood sugars. Follow met b and a1c.

## 2020-06-15 ENCOUNTER — Other Ambulatory Visit: Payer: Self-pay | Admitting: Internal Medicine

## 2020-06-15 DIAGNOSIS — Z1231 Encounter for screening mammogram for malignant neoplasm of breast: Secondary | ICD-10-CM

## 2020-06-23 ENCOUNTER — Ambulatory Visit (INDEPENDENT_AMBULATORY_CARE_PROVIDER_SITE_OTHER): Payer: Medicare Other

## 2020-06-23 VITALS — BP 112/67 | HR 72 | Ht 63.0 in | Wt 137.0 lb

## 2020-06-23 DIAGNOSIS — Z0000 Encounter for general adult medical examination without abnormal findings: Secondary | ICD-10-CM

## 2020-06-23 NOTE — Patient Instructions (Addendum)
Ms. Wanda Ruiz , Thank you for taking time to come for your Medicare Wellness Visit. I appreciate your ongoing commitment to your health goals. Please review the following plan we discussed and let me know if I can assist you in the future.   These are the goals we discussed:  Goals       Patient Stated     Maintain Healthy Lifestyle (pt-stated)        This is a list of the screening recommended for you and due dates:  Health Maintenance  Topic Date Due   Tetanus Vaccine  Never done   Mammogram  07/10/2020   Flu Shot  08/09/2020   Hemoglobin A1C  11/13/2020   Eye exam for diabetics  02/25/2021   Complete foot exam   05/17/2021   DEXA scan (bone density measurement)  Completed   COVID-19 Vaccine  Completed   Pneumonia vaccines  Completed   Zoster (Shingles) Vaccine  Completed   HPV Vaccine  Aged Out    Advanced directives: End of life planning; Advance aging; Advanced directives discussed.  Copy of current HCPOA/Living Will requested.    Conditions/risks identified: none new  Follow up in one year for your annual wellness visit    Preventive Care 65 Years and Older, Female Preventive care refers to lifestyle choices and visits with your health care provider that can promote health and wellness. What does preventive care include? A yearly physical exam. This is also called an annual well check. Dental exams once or twice a year. Routine eye exams. Ask your health care provider how often you should have your eyes checked. Personal lifestyle choices, including: Daily care of your teeth and gums. Regular physical activity. Eating a healthy diet. Avoiding tobacco and drug use. Limiting alcohol use. Practicing safe sex. Taking low-dose aspirin every day. Taking vitamin and mineral supplements as recommended by your health care provider. What happens during an annual well check? The services and screenings done by your health care provider during your annual well check will  depend on your age, overall health, lifestyle risk factors, and family history of disease. Counseling  Your health care provider may ask you questions about your: Alcohol use. Tobacco use. Drug use. Emotional well-being. Home and relationship well-being. Sexual activity. Eating habits. History of falls. Memory and ability to understand (cognition). Work and work Statistician. Reproductive health. Screening  You may have the following tests or measurements: Height, weight, and BMI. Blood pressure. Lipid and cholesterol levels. These may be checked every 5 years, or more frequently if you are over 41 years old. Skin check. Lung cancer screening. You may have this screening every year starting at age 76 if you have a 30-pack-year history of smoking and currently smoke or have quit within the past 15 years. Fecal occult blood test (FOBT) of the stool. You may have this test every year starting at age 27. Flexible sigmoidoscopy or colonoscopy. You may have a sigmoidoscopy every 5 years or a colonoscopy every 10 years starting at age 59. Hepatitis C blood test. Hepatitis B blood test. Sexually transmitted disease (STD) testing. Diabetes screening. This is done by checking your blood sugar (glucose) after you have not eaten for a while (fasting). You may have this done every 1-3 years. Bone density scan. This is done to screen for osteoporosis. You may have this done starting at age 75. Mammogram. This may be done every 1-2 years. Talk to your health care provider about how often you should have regular  mammograms. Talk with your health care provider about your test results, treatment options, and if necessary, the need for more tests. Vaccines  Your health care provider may recommend certain vaccines, such as: Influenza vaccine. This is recommended every year. Tetanus, diphtheria, and acellular pertussis (Tdap, Td) vaccine. You may need a Td booster every 10 years. Zoster vaccine. You may  need this after age 9. Pneumococcal 13-valent conjugate (PCV13) vaccine. One dose is recommended after age 63. Pneumococcal polysaccharide (PPSV23) vaccine. One dose is recommended after age 17. Talk to your health care provider about which screenings and vaccines you need and how often you need them. This information is not intended to replace advice given to you by your health care provider. Make sure you discuss any questions you have with your health care provider. Document Released: 01/22/2015 Document Revised: 09/15/2015 Document Reviewed: 10/27/2014 Elsevier Interactive Patient Education  2017 [place] Prevention in the Home Falls can cause injuries. They can happen to people of all ages. There are many things you can do to make your home safe and to help prevent falls. What can I do on the outside of my home? Regularly fix the edges of walkways and driveways and fix any cracks. Remove anything that might make you trip as you walk through a door, such as a raised step or threshold. Trim any bushes or trees on the path to your home. Use bright outdoor lighting. Clear any walking paths of anything that might make someone trip, such as rocks or tools. Regularly check to see if handrails are loose or broken. Make sure that both sides of any steps have handrails. Any raised decks and porches should have guardrails on the edges. Have any leaves, snow, or ice cleared regularly. Use sand or salt on walking paths during winter. Clean up any spills in your garage right away. This includes oil or grease spills. What can I do in the bathroom? Use night lights. Install grab bars by the toilet and in the tub and shower. Do not use towel bars as grab bars. Use non-skid mats or decals in the tub or shower. If you need to sit down in the shower, use a plastic, non-slip stool. Keep the floor dry. Clean up any water that spills on the floor as soon as it happens. Remove soap buildup in  the tub or shower regularly. Attach bath mats securely with double-sided non-slip rug tape. Do not have throw rugs and other things on the floor that can make you trip. What can I do in the bedroom? Use night lights. Make sure that you have a light by your bed that is easy to reach. Do not use any sheets or blankets that are too big for your bed. They should not hang down onto the floor. Have a firm chair that has side arms. You can use this for support while you get dressed. Do not have throw rugs and other things on the floor that can make you trip. What can I do in the kitchen? Clean up any spills right away. Avoid walking on wet floors. Keep items that you use a lot in easy-to-reach places. If you need to reach something above you, use a strong step stool that has a grab bar. Keep electrical cords out of the way. Do not use floor polish or wax that makes floors slippery. If you must use wax, use non-skid floor wax. Do not have throw rugs and other things on the floor that can  make you trip. What can I do with my stairs? Do not leave any items on the stairs. Make sure that there are handrails on both sides of the stairs and use them. Fix handrails that are broken or loose. Make sure that handrails are as long as the stairways. Check any carpeting to make sure that it is firmly attached to the stairs. Fix any carpet that is loose or worn. Avoid having throw rugs at the top or bottom of the stairs. If you do have throw rugs, attach them to the floor with carpet tape. Make sure that you have a light switch at the top of the stairs and the bottom of the stairs. If you do not have them, ask someone to add them for you. What else can I do to help prevent falls? Wear shoes that: Do not have high heels. Have rubber bottoms. Are comfortable and fit you well. Are closed at the toe. Do not wear sandals. If you use a stepladder: Make sure that it is fully opened. Do not climb a closed  stepladder. Make sure that both sides of the stepladder are locked into place. Ask someone to hold it for you, if possible. Clearly mark and make sure that you can see: Any grab bars or handrails. First and last steps. Where the edge of each step is. Use tools that help you move around (mobility aids) if they are needed. These include: Canes. Walkers. Scooters. Crutches. Turn on the lights when you go into a dark area. Replace any light bulbs as soon as they burn out. Set up your furniture so you have a clear path. Avoid moving your furniture around. If any of your floors are uneven, fix them. If there are any pets around you, be aware of where they are. Review your medicines with your doctor. Some medicines can make you feel dizzy. This can increase your chance of falling. Ask your doctor what other things that you can do to help prevent falls. This information is not intended to replace advice given to you by your health care provider. Make sure you discuss any questions you have with your health care provider. Document Released: 10/22/2008 Document Revised: 06/03/2015 Document Reviewed: 01/30/2014 Elsevier Interactive Patient Education  2017 [employer].

## 2020-06-23 NOTE — Progress Notes (Addendum)
Subjective:   Wanda Ruiz is a 85 y.o. female who presents for Medicare Annual (Subsequent) preventive examination.  Review of Systems    No ROS.  Medicare Wellness Virtual Visit.  Visual/audio telehealth visit, UTA vital signs.   See social history for additional risk factors.   Cardiac Risk Factors include: advanced age (>72men, >85 women);diabetes mellitus;hypertension     Objective:    Today's Vitals   06/23/20 1534  BP: 112/67  Pulse: 72  Weight: 137 lb (62.1 kg)  Height: 5\' 3"  (1.6 m)   Body mass index is 24.27 kg/m.  Advanced Directives 06/23/2020 06/23/2019 02/21/2019 06/13/2018 11/09/2017 06/11/2017 06/17/2016  Does Patient Have a Medical Advance Directive? Yes Yes No Yes No No Yes  Type of Paramedic of [place];Living will [place];Living will - - - - -  Does patient want to make changes to medical advance directive? No - Patient declined No - Patient declined - No - Patient declined - - -  Copy of [place] in Chart? No - copy requested No - copy requested - - - - -  Would patient like information on creating a medical advance directive? - - No - Patient declined - - No - Patient declined -    Current Medications (verified) Outpatient Encounter Medications as of 06/23/2020  Medication Sig   ACCU-CHEK AVIVA PLUS test strip USE TO CHECK BLOOD SUGAR TWICE A DAY   ACCU-CHEK SOFTCLIX LANCETS lancets CHECK BLOOD SUGAR ONCE A DAY DX E11.9   aspirin 81 MG tablet Take 81 mg by mouth daily.   calcium-vitamin D (OSCAL WITH D) 250-125 MG-UNIT tablet Take 1 tablet by mouth daily.   ezetimibe (ZETIA) 10 MG tablet TAKE 1 TABLET BY MOUTH EVERY DAY   glipiZIDE (GLUCOTROL XL) 5 MG 24 hr tablet TAKE 1 TABLET DAILY   lisinopril (ZESTRIL) 10 MG tablet TAKE 1 TABLET DAILY   metFORMIN (GLUCOPHAGE) 500 MG tablet TAKE 2 TABLETS TWICE A DAY   Multiple Vitamin (MULTIVITAMIN) tablet Take 1 tablet by mouth daily.   omeprazole  (PRILOSEC) 20 MG capsule TAKE 1 CAPSULE DAILY   No facility-administered encounter medications on file as of 06/23/2020.    Allergies (verified) Patient has no known allergies.   History: Past Medical History:  Diagnosis Date   Anemia    Diabetes mellitus ([place])    GERD (gastroesophageal reflux disease)    Hypercholesterolemia    Hypertension    Hypothyroidism    Osteoporosis    Past Surgical History:  Procedure Laterality Date   COLONOSCOPY     COLONOSCOPY WITH PROPOFOL N/A 11/09/2017   Procedure: COLONOSCOPY WITH PROPOFOL;  Surgeon: Lollie Sails, MD;  Location: [facility] ENDOSCOPY;  Service: Endoscopy;  Laterality: N/A;   CYST EXCISION     low back   ESOPHAGOGASTRODUODENOSCOPY (EGD) WITH PROPOFOL N/A 11/09/2017   Procedure: ESOPHAGOGASTRODUODENOSCOPY (EGD) WITH PROPOFOL;  Surgeon: Lollie Sails, MD;  Location: [facility] ENDOSCOPY;  Service: Endoscopy;  Laterality: N/A;   THYROID SURGERY  1992   left hemi-thyroidectomy (colloid goiter0   UPPER GASTROINTESTINAL ENDOSCOPY     Family History  Problem Relation Age of Onset   Emphysema Father    Heart disease Mother        myocardial infarction (71)   Hypertension Mother    Diabetes Mother    Heart disease Brother        drug use   Diabetes Mellitus II Sister  x3   Hypertension Sister    Breast cancer Other        niece   Breast cancer Sister    Cancer Sister    Colon cancer Neg Hx    Social History   Socioeconomic History   Marital status: Married    Spouse name: Not on file   Number of children: 2   Years of education: Not on file   Highest education level: Not on file  Occupational History   Not on file  Tobacco Use   Smoking status: Never   Smokeless tobacco: Never  Vaping Use   Vaping Use: Never used  Substance and Sexual Activity   Alcohol use: No    Alcohol/week: 0.0 standard drinks    Comment: occasional   Drug use: Never   Sexual activity: Not on file  Other Topics Concern   Not on file   Social History Narrative   In [place]; with husband; never smoked;  no alcohol; telephone receptionist.    Social Determinants of Health   Financial Resource Strain: Low Risk    Difficulty of Paying Living Expenses: Not hard at all  Food Insecurity: No Food Insecurity   Worried About Charity fundraiser in the Last Year: Never true   [place] in the Last Year: Never true  Transportation Needs: No Transportation Needs   Lack of Transportation (Medical): No   Lack of Transportation (Non-Medical): No  Physical Activity: Not on file  Stress: No Stress Concern Present   Feeling of Stress : Not at all  Social Connections: Unknown   Frequency of Communication with Friends and Family: More than three times a week   Frequency of Social Gatherings with Friends and Family: Not on file   Attends Religious Services: Not on Electrical engineer or Organizations: Not on file   Attends Archivist Meetings: Not on file   Marital Status: Not on file    Tobacco Counseling Counseling given: Not Answered   Clinical Intake:  Pre-visit preparation completed: Yes        Diabetes: Yes (Followed by pcp)  How often do you need to have someone help you when you read instructions, pamphlets, or other written materials from your doctor or pharmacy?: 1 - Never Nutrition Risk Assessment: Has the patient had any N/V/D within the last 2 months?  No Does the patient have any non-healing wounds?  No  Has the patient had any unintentional weight loss or weight gain?  No   Diabetes: Did the patient bring in their glucometer from home?  No  How often do you monitor your CBG's? Daily.  Financial Strains and Diabetes Management: Are you having any financial strains with the device, your supplies or your medication? No .  Does the patient want to be seen by Chronic Care Management for management of their diabetes?  No  Would the patient like to be referred to a Nutritionist  or for Diabetic Management?  No   Interpreter Needed?: No      Activities of Daily Living In your present state of health, do you have any difficulty performing the following activities: 06/23/2020  Hearing? N  Vision? N  Difficulty concentrating or making decisions? N  Walking or climbing stairs? N  Dressing or bathing? N  Doing errands, shopping? N  Preparing Food and eating ? N  Using the Toilet? N  In the past six months, have you accidently leaked urine? N  Do you have problems with loss of bowel control? N  Managing your Medications? N  Managing your Finances? N  Housekeeping or managing your Housekeeping? N  Some recent data might be hidden    Patient Care Team: Einar Pheasant, MD as PCP - General (Internal Medicine)  Indicate any recent Medical Services you may have received from other than [facility] providers in the past year (date may be approximate).     Assessment:   This is a routine wellness examination for Tamma.  I connected with Violett today by telephone and verified that I am speaking with the correct person using two identifiers. Location patient: home Location provider: work Persons participating in the virtual visit: patient, Marine scientist.    I discussed the limitations, risks, security and privacy concerns of performing an evaluation and management service by telephone and the availability of in person appointments. The patient expressed understanding and verbally consented to this telephonic visit.    Interactive audio and video telecommunications were attempted between this provider and patient, however failed, due to patient having technical difficulties OR patient did not have access to video capability.  We continued and completed visit with audio only.  Some vital signs may be absent or patient reported.   Hearing/Vision screen Hearing Screening - Comments:: Patient is able to hear conversational tones without difficulty.  No issues  reported. Vision Screening - Comments:: Visual acuity not assessed, virtual visit.  They have seen their ophthalmologist in the last 12 months.    Dietary issues and exercise activities discussed: Current Exercise Habits: Home exercise routine Healthy diet Good water intake   Goals Addressed               This Visit's Progress     Patient Stated     Maintain Healthy Lifestyle (pt-stated)         Depression Screen PHQ 2/9 Scores 06/23/2020 06/23/2019 06/13/2018 08/10/2017 06/11/2017 06/21/2016 06/09/2016  PHQ - 2 Score 0 0 1 0 0 0 0  PHQ- 9 Score - - - - - - 0    Fall Risk Fall Risk  06/23/2020 05/17/2020 06/23/2019 06/13/2018 08/10/2017  Falls in the past year? 0 0 0 0 No  Number falls in past yr: 0 0 0 - -  Injury with Fall? 0 0 - - -  Follow up Falls evaluation completed Falls evaluation completed Falls evaluation completed - -    FALL RISK PREVENTION PERTAINING TO THE HOME: Handrails in use when climbing stairs? Yes Home free of loose throw rugs in walkways, pet beds, electrical cords, etc? Yes  Adequate lighting in your home to reduce risk of falls? Yes   ASSISTIVE DEVICES UTILIZED TO PREVENT FALLS: Life alert? No  Use of a cane, walker or w/c? No   TIMED UP AND GO: Was the test performed? No .   Cognitive Function: Patient is alert and oriented x3.  Denies difficulty focusing, making decisions, memory loss.  MMSE - Mini Mental State Exam 06/09/2016  Orientation to time 5  Orientation to Place 5  Registration 3  Attention/ Calculation 5  Recall 3  Language- name 2 objects 2  Language- repeat 1  Language- follow 3 step command 3  Language- read & follow direction 1  Write a sentence 1  Copy design 1  Total score 30     6CIT Screen 06/23/2019 06/13/2018 06/11/2017  What Year? 0 points 0 points 0 points  What month? 0 points 0 points 0 points  What time? -  0 points 0 points  Count back from 20 - 0 points 0 points  Months in reverse 0 points 0 points 0 points  Repeat  phrase - 0 points 0 points  Total Score - 0 0    Immunizations Immunization History  Administered Date(s) Administered   Fluad Quad(high Dose 65+) 09/10/2018, 10/09/2019   Influenza, High Dose Seasonal PF 10/22/2015, 09/19/2016, 11/22/2017   Influenza,inj,Quad PF,6+ Mos 10/13/2013, 10/15/2014   PFIZER Comirnaty(Gray Top)Covid-19 Tri-Sucrose Vaccine 04/20/2020   PFIZER(Purple Top)SARS-COV-2 Vaccination 02/07/2019, 02/28/2019, 10/04/2019   Pneumococcal Conjugate-13 02/13/2014   Pneumococcal Polysaccharide-23 01/25/2017   Zoster Recombinat (Shingrix) 10/23/2019, 12/22/2019    TDAP status: Due, Education has been provided regarding the importance of this vaccine. Advised may receive this vaccine at local pharmacy or Health Dept. Aware to provide a copy of the vaccination record if obtained from local pharmacy or Health Dept. Verbalized acceptance and understanding. Deferred.    Health Maintenance Health Maintenance  Topic Date Due   TETANUS/TDAP  Never done   MAMMOGRAM  07/10/2020   INFLUENZA VACCINE  08/09/2020   HEMOGLOBIN A1C  11/13/2020   OPHTHALMOLOGY EXAM  02/25/2021   FOOT EXAM  05/17/2021   DEXA SCAN  Completed   COVID-19 Vaccine  Completed   PNA vac Low Risk Adult  Completed   Zoster Vaccines- Shingrix  Completed   HPV VACCINES  Aged Out   Mammogram- scheduled 07/14/20  Lung Cancer Screening: (Low Dose CT Chest recommended if Age 25-80 years, 30 pack-year currently smoking OR have quit w/in 15years.) does not qualify.   Vision Screening: Recommended annual ophthalmology exams for early detection of glaucoma and other disorders of the eye. Is the patient up to date with their annual eye exam?  No   Dental Screening: Recommended annual dental exams for proper oral hygiene  Community Resource Referral / Chronic Care Management: CRR required this visit?  No   CCM required this visit?  No      Plan:   I have personally reviewed and noted the following in the  patient's chart:   Medical and social history Use of alcohol, tobacco or illicit drugs  Current medications and supplements including opioid prescriptions.  Functional ability and status Nutritional status Physical activity Advanced directives List of other physicians Hospitalizations, surgeries, and ER visits in previous 12 months Vitals Screenings to include cognitive, depression, and falls Referrals and appointments  In addition, I have reviewed and discussed with patient certain preventive protocols, quality metrics, and best practice recommendations. A written personalized care plan for preventive services as well as general preventive health recommendations were provided to patient via mychart.     OBrien-Blaney, Kassadi Presswood L, LPN   4/82/[id number]     I have reviewed the above information and agree with above.   Deborra Medina, MD

## 2020-06-30 ENCOUNTER — Other Ambulatory Visit: Payer: Self-pay | Admitting: Surgery

## 2020-06-30 DIAGNOSIS — E042 Nontoxic multinodular goiter: Secondary | ICD-10-CM

## 2020-07-05 ENCOUNTER — Other Ambulatory Visit: Payer: Self-pay | Admitting: Family

## 2020-07-14 ENCOUNTER — Ambulatory Visit
Admission: RE | Admit: 2020-07-14 | Discharge: 2020-07-14 | Disposition: A | Discharge status: Home / Self Care | Payer: Medicare Other | Source: Ambulatory Visit | Attending: Internal Medicine | Admitting: Internal Medicine

## 2020-07-14 ENCOUNTER — Other Ambulatory Visit: Payer: Self-pay

## 2020-07-14 DIAGNOSIS — Z1231 Encounter for screening mammogram for malignant neoplasm of breast: Secondary | ICD-10-CM | POA: Insufficient documentation

## 2020-07-22 ENCOUNTER — Ambulatory Visit: Payer: Medicare Other

## 2020-08-20 ENCOUNTER — Other Ambulatory Visit: Payer: Self-pay

## 2020-08-20 ENCOUNTER — Other Ambulatory Visit (INDEPENDENT_AMBULATORY_CARE_PROVIDER_SITE_OTHER): Payer: Medicare Other

## 2020-08-20 DIAGNOSIS — E7800 Pure hypercholesterolemia, unspecified: Secondary | ICD-10-CM | POA: Diagnosis not present

## 2020-08-20 DIAGNOSIS — I10 Essential (primary) hypertension: Secondary | ICD-10-CM | POA: Diagnosis not present

## 2020-08-20 DIAGNOSIS — D509 Iron deficiency anemia, unspecified: Secondary | ICD-10-CM

## 2020-08-20 DIAGNOSIS — E1165 Type 2 diabetes mellitus with hyperglycemia: Secondary | ICD-10-CM

## 2020-08-20 LAB — LIPID PANEL
Cholesterol: 163 mg/dL (ref 0–200)
HDL: 50 mg/dL (ref >39.00)
LDL Cholesterol: 97 mg/dL (ref 0–99)
NonHDL: 112.55
Total CHOL/HDL Ratio: 3
Triglycerides: 77 mg/dL (ref 0.0–149.0)
VLDL: 15.4 mg/dL (ref 0.0–40.0)

## 2020-08-20 LAB — CBC WITH DIFFERENTIAL/PLATELET
Basophils Absolute: 0.1 10*3/uL (ref 0.0–0.1)
Basophils Relative: 1.3 % (ref 0.0–3.0)
Eosinophils Absolute: 0.2 10*3/uL (ref 0.0–0.7)
Eosinophils Relative: 2.1 % (ref 0.0–5.0)
HCT: 34.2 % — ABNORMAL LOW (ref 36.0–46.0)
Hemoglobin: 10.7 g/dL — ABNORMAL LOW (ref 12.0–15.0)
Lymphocytes Relative: 25.1 % (ref 12.0–46.0)
Lymphs Abs: 1.9 10*3/uL (ref 0.7–4.0)
MCHC: 31.2 g/dL (ref 30.0–36.0)
MCV: 78.5 fl (ref 78.0–100.0)
Monocytes Absolute: 0.6 10*3/uL (ref 0.1–1.0)
Monocytes Relative: 7.3 % (ref 3.0–12.0)
Neutro Abs: 4.9 10*3/uL (ref 1.4–7.7)
Neutrophils Relative %: 64.2 % (ref 43.0–77.0)
Platelets: 281 10*3/uL (ref 150.0–400.0)
RBC: 4.36 Mil/uL (ref 3.87–5.11)
RDW: 16.8 % — ABNORMAL HIGH (ref 11.5–15.5)
WBC: 7.6 10*3/uL (ref 4.0–10.5)

## 2020-08-20 LAB — BASIC METABOLIC PANEL
BUN: 20 mg/dL (ref 6–23)
CO2: 24 mEq/L (ref 19–32)
Calcium: 9.3 mg/dL (ref 8.4–10.5)
Chloride: 104 mEq/L (ref 96–112)
Creatinine, Ser: 0.88 mg/dL (ref 0.40–1.20)
GFR: 60.79 mL/min (ref >60.00)
Glucose, Bld: 104 mg/dL — ABNORMAL HIGH (ref 70–99)
Potassium: 5.1 mEq/L (ref 3.5–5.1)
Sodium: 137 mEq/L (ref 135–145)

## 2020-08-20 LAB — HEPATIC FUNCTION PANEL
ALT: 13 U/L (ref 0–35)
AST: 14 U/L (ref 0–37)
Albumin: 4.2 g/dL (ref 3.5–5.2)
Alkaline Phosphatase: 41 U/L (ref 39–117)
Bilirubin, Direct: 0.1 mg/dL (ref 0.0–0.3)
Total Bilirubin: 0.4 mg/dL (ref 0.2–1.2)
Total Protein: 7.2 g/dL (ref 6.0–8.3)

## 2020-08-20 LAB — FERRITIN: Ferritin: 97.6 ng/mL (ref 10.0–291.0)

## 2020-08-20 LAB — HEMOGLOBIN A1C: Hgb A1c MFr Bld: 6.8 % — ABNORMAL HIGH (ref 4.6–6.5)

## 2020-08-24 ENCOUNTER — Other Ambulatory Visit: Payer: Self-pay

## 2020-08-24 ENCOUNTER — Ambulatory Visit: Payer: Medicare Other | Admitting: Internal Medicine

## 2020-08-24 DIAGNOSIS — E1165 Type 2 diabetes mellitus with hyperglycemia: Secondary | ICD-10-CM | POA: Diagnosis not present

## 2020-08-24 DIAGNOSIS — H9202 Otalgia, left ear: Secondary | ICD-10-CM

## 2020-08-24 DIAGNOSIS — E875 Hyperkalemia: Secondary | ICD-10-CM

## 2020-08-24 DIAGNOSIS — E7800 Pure hypercholesterolemia, unspecified: Secondary | ICD-10-CM

## 2020-08-24 DIAGNOSIS — I10 Essential (primary) hypertension: Secondary | ICD-10-CM

## 2020-08-24 DIAGNOSIS — E039 Hypothyroidism, unspecified: Secondary | ICD-10-CM | POA: Diagnosis not present

## 2020-08-24 DIAGNOSIS — D509 Iron deficiency anemia, unspecified: Secondary | ICD-10-CM

## 2020-08-24 DIAGNOSIS — F439 Reaction to severe stress, unspecified: Secondary | ICD-10-CM

## 2020-08-24 DIAGNOSIS — I700 Atherosclerosis of aorta: Secondary | ICD-10-CM

## 2020-08-24 LAB — POTASSIUM: Potassium: 4.6 mEq/L (ref 3.5–5.1)

## 2020-08-24 NOTE — Progress Notes (Addendum)
[name], female   DOB: 03/31/1935, 85 y.o.   MRN: [id number]   Subjective:    [name], female    DOB: 28-Jul-1935, 85 y.o.   MRN: [id number]  HPI This visit occurred during the SARS-CoV-2 public health emergency.  Safety protocols were in place, including screening questions prior to the visit, additional usage of staff PPE, and extensive cleaning of exam room while observing appropriate contact time as indicated for disinfecting solutions.   Patient here for a scheduled follow up.  Here to follow up regarding her blood pressure, cholesterol and diabetes.  She reports she is doing relatively well.  Tries to stay active.  No chest pain reported.  Breathing stable.  No increased cough or congestion.  No increased abdominal pain reported.  No bowel issues reported.  No sick contacts.  No fever.  No nausea or vomiting. She does report noticing occasional left ear discomfort. Very brief (second) and then resolves.  Notices after lying on her ear - when it occurs.     Past Medical History:  Diagnosis Date   Anemia    Diabetes mellitus ([place])    GERD (gastroesophageal reflux disease)    Hypercholesterolemia    Hypertension    Hypothyroidism    Osteoporosis    Past Surgical History:  Procedure Laterality Date   COLONOSCOPY     COLONOSCOPY WITH PROPOFOL N/A 11/09/2017   Procedure: COLONOSCOPY WITH PROPOFOL;  Surgeon: Lollie Sails, MD;  Location[facility] ENDOSCOPY;  Service: Endoscopy;  Laterality: N/A;   CYST EXCISION     low back   ESOPHAGOGASTRODUODENOSCOPY (EGD) WITH PROPOFOL N/A 11/09/2017   Procedure: ESOPHAGOGASTRODUODENOSCOPY (EGD) WITH PROPOFOL;  Surgeon: Lollie Sails, MD;  [facility] END[facility]COPY;  Service: Endoscopy;  Laterality: N/A;   THYROID SURGERY  1992   left hemi-thyroidectomy (colloid goiter0   UPPER GASTROINTESTINAL ENDOSCOPY     Family History  Problem Relation Age of Onset   Emphysema Father    Heart disease Mother         myocardial infarction (61)   Hypertension Mother    Diabetes Mother    Heart disease Brother        drug use   Diabetes Mellitus II Sister        x3   Hypertension Sister    Breast cancer Other        niece   Breast cancer Sister    Cancer Sister    Colon cancer Neg Hx    Social History   Socioeconomic History   Marital status: Married    Spouse name: Not on file   Number of children: 2   Years of education: Not on file   Highest education level: Not on file  Occupational History   Not on file  Tobacco Use   Smoking status: Never   Smokeless tobacco: Never  Vaping Use   Vaping Use: Never used  Substance and Sexual Activity   Alcohol use: No    Alcohol/week: 0.0 standard drinks    Comment: occasional   Drug use: Never   Sexual activity: Not on file  Other Topics Concern   Not on file  Social History Narrative   In [place]; with husband; never smoked;  no alcohol; telephone receptionist.    Social Determinants of Health   Financial Resource Strain: Low Risk    Difficulty of Paying Living Expenses: Not hard at all  Food Insecurity: No Food Insecurity   Worried About Running Out  of Food in the Last Year: Never true   [place] in the Last Year: Never true  Transportation Needs: No Transportation Needs   Lack of Transportation (Medical): No   Lack of Transportation (Non-Medical): No  Physical Activity: Not on file  Stress: No Stress Concern Present   Feeling of Stress : Not at all  Social Connections: Unknown   Frequency of Communication with Friends and Family: More than three times a week   Frequency of Social Gatherings with Friends and Family: Not on file   Attends Religious Services: Not on file   Active Member of Clubs or Organizations: Not on file   Attends Archivist Meetings: Not on file   Marital Status: Not on file    Review of Systems  Constitutional:  Negative for appetite change and unexpected weight change.  HENT:  Negative  for congestion and sinus pressure.   Respiratory:  Negative for cough, chest tightness and shortness of breath.   Cardiovascular:  Negative for chest pain, palpitations and leg swelling.  Gastrointestinal:  Negative for abdominal pain, diarrhea, nausea and vomiting.  Genitourinary:  Negative for difficulty urinating and dysuria.  Musculoskeletal:  Negative for joint swelling and myalgias.  Skin:  Negative for color change and rash.  Neurological:  Negative for dizziness, light-headedness and headaches.  Psychiatric/Behavioral:  Negative for agitation and dysphoric mood.       Objective:    Physical Exam Vitals reviewed.  Constitutional:      General: She is not in acute distress.    Appearance: Normal appearance.  HENT:     Head: Normocephalic and atraumatic.     Right Ear: Tympanic membrane, ear canal and external ear normal.     Left Ear: Tympanic membrane, ear canal and external ear normal.  Eyes:     General: No scleral icterus.       Right eye: No discharge.        Left eye: No discharge.     Conjunctiva/sclera: Conjunctivae normal.  Neck:     Thyroid: No thyromegaly.  Cardiovascular:     Rate and Rhythm: Normal rate and regular rhythm.  Pulmonary:     Effort: No respiratory distress.     Breath sounds: Normal breath sounds. No wheezing.  Abdominal:     General: Bowel sounds are normal.     Palpations: Abdomen is soft.     Tenderness: There is no abdominal tenderness.  Musculoskeletal:        General: No swelling or tenderness.     Cervical back: Neck supple. No tenderness.  Lymphadenopathy:     Cervical: No cervical adenopathy.  Skin:    Findings: No erythema or rash.  Neurological:     Mental Status: She is alert.  Psychiatric:        Mood and Affect: Mood normal.        Behavior: Behavior normal.    BP 118/70   Pulse 75   Temp 97.6 F (36.4 C)   Resp 16   Ht _0  (1.6 m)   Wt 138 lb (62.6 kg)   SpO2 99%   BMI 24.45 kg/m  Wt Readings from Last 3  Encounters:  08/24/20 138 lb (62.6 kg)  06/23/20 137 lb (62.1 kg)  05/17/20 137 lb (62.1 kg)    Outpatient Encounter Medications as of 08/24/2020  Medication Sig   ACCU-CHEK AVIVA PLUS test strip USE TO CHECK BLOOD SUGAR TWICE A DAY   ACCU-CHEK SOFTCLIX LANCETS  lancets CHECK BLOOD SUGAR ONCE A DAY DX E11.9   aspirin 81 MG tablet Take 81 mg by mouth daily.   calcium-vitamin D (OSCAL WITH D) 250-125 MG-UNIT tablet Take 1 tablet by mouth daily.   ezetimibe (ZETIA) 10 MG tablet TAKE 1 TABLET BY MOUTH EVERY DAY   glipiZIDE (GLUCOTROL XL) 5 MG 24 hr tablet TAKE 1 TABLET DAILY   lisinopril (ZESTRIL) 10 MG tablet TAKE 1 TABLET DAILY   metFORMIN (GLUCOPHAGE) 500 MG tablet TAKE 2 TABLETS TWICE A DAY   Multiple Vitamin (MULTIVITAMIN) tablet Take 1 tablet by mouth daily.   omeprazole (PRILOSEC) 20 MG capsule TAKE 1 CAPSULE DAILY   No facility-administered encounter medications on file as of 08/24/2020.     Lab Results  Component Value Date   WBC 7.6 08/20/2020   HGB 10.7 (L) 08/20/2020   HCT 34.2 (L) 08/20/2020   PLT 281.0 08/20/2020   GLUCOSE 104 (H) 08/20/2020   CHOL 163 08/20/2020   TRIG 77.0 08/20/2020   HDL 50.00 08/20/2020   LDLCALC 97 08/20/2020   ALT 13 08/20/2020   AST 14 08/20/2020   NA 137 08/20/2020   K 4.6 08/24/2020   CL 104 08/20/2020   CREATININE 0.88 08/20/2020   BUN 20 08/20/2020   CO2 24 08/20/2020   TSH 0.73 05/13/2020   HGBA1C 6.8 (H) 08/20/2020   MICROALBUR 1.6 02/14/2019    MM 3D SCREEN BREAST BILATERAL  Result Date: 07/16/2020 CLINICAL DATA:  Screening. EXAM: DIGITAL SCREENING BILATERAL MAMMOGRAM WITH TOMOSYNTHESIS AND CAD TECHNIQUE: Bilateral screening digital craniocaudal and mediolateral oblique mammograms were obtained. Bilateral screening digital breast tomosynthesis was performed. The images were evaluated with computer-aided detection. COMPARISON:  Previous exam(s). ACR Breast Density Category b: There are scattered areas of fibroglandular density.  FINDINGS: There are no findings suspicious for malignancy. IMPRESSION: No mammographic evidence of malignancy. A result letter of this screening mammogram will be mailed directly to the patient. RECOMMENDATION: Screening mammogram in one year. (Code:SM-B-01Y) BI-RADS CATEGORY  1: Negative. Electronically Signed   By: Kristopher Oppenheim M.D.   On: 07/16/2020 09:45      Assessment & Plan:   Problem List Items Addressed This Visit     Aortic atherosclerosis ([place])    Intolerant to statins.  Continue Zetia.      Ear pain, left    Intermittent. Notices after lying on ear.  No pain today.  No abnormality noted on exam.       Hypercholesterolemia    Intolerant to statin medication.  On zetia.  Low cholesterol diet and exercise.  Follow lipid panel.       Relevant Orders   Hepatic function panel   Lipid panel   Hyperkalemia    Recheck potassium to confirm stable. Not taking any potassium supplements.       Relevant Orders   Potassium (Completed)   Basic metabolic panel   Hypertension    Continue lisinopril.  Blood pressure doing well.  Follow pressures.  Follow metabolic panel.       Relevant Orders   Hemoglobin A1c   Hypothyroidism    History of thyroid nodules and hypothyroidism.  Follow tsh.       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  Follow cbc.       Relevant Orders   CBC with Differential/Platelet   Ferritin   Stress    Overall appears to be handling things well. Does not feel needs any further intervention.  Follow.  Type 2 diabetes mellitus with hyperglycemia (HCC)    Continues metformin and glipizide given elevated blood sugars.  Low carb diet and exercise given elevated blood sugars. Follow met b and a1c.  Lab Results  Component Value Date   HGBA1C 6.8 (H) 08/20/2020         Einar Pheasant, MD

## 2020-08-30 ENCOUNTER — Encounter: Payer: Self-pay | Admitting: Internal Medicine

## 2020-08-30 DIAGNOSIS — H9202 Otalgia, left ear: Secondary | ICD-10-CM | POA: Insufficient documentation

## 2020-08-30 DIAGNOSIS — I700 Atherosclerosis of aorta: Secondary | ICD-10-CM | POA: Insufficient documentation

## 2020-08-30 NOTE — Assessment & Plan Note (Signed)
Intolerant to statin medication. On zetia.  Low cholesterol diet and exercise.  Follow lipid panel.  

## 2020-08-30 NOTE — Assessment & Plan Note (Signed)
History of thyroid nodules and hypothyroidism.  Follow tsh.

## 2020-08-30 NOTE — Assessment & Plan Note (Signed)
Recheck potassium to confirm stable. Not taking any potassium supplements.

## 2020-08-30 NOTE — Assessment & Plan Note (Addendum)
Continues metformin and glipizide given elevated blood sugars.  Low carb diet and exercise given elevated blood sugars. Follow met b and a1c.  Lab Results  Component Value Date   HGBA1C 6.8 (H) 08/20/2020   

## 2020-08-30 NOTE — Assessment & Plan Note (Signed)
Intermittent. Notices after lying on ear.  No pain today.  No abnormality noted on exam.

## 2020-08-30 NOTE — Assessment & Plan Note (Signed)
Continue lisinopril.  Blood pressure doing well.  Follow pressures.  Follow metabolic panel.  

## 2020-08-30 NOTE — Assessment & Plan Note (Signed)
Overall appears to be handling things well.  Does not feel needs any further intervention. Follow.  

## 2020-08-30 NOTE — Assessment & Plan Note (Signed)
Intolerant to statins.  Continue Zetia.

## 2020-08-30 NOTE — Assessment & Plan Note (Signed)
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  Follow cbc.

## 2020-09-19 ENCOUNTER — Other Ambulatory Visit: Payer: Self-pay | Admitting: Internal Medicine

## 2020-09-30 ENCOUNTER — Ambulatory Visit
Admission: RE | Admit: 2020-09-30 | Discharge: 2020-09-30 | Disposition: A | Discharge status: Home / Self Care | Payer: Medicare Other | Source: Ambulatory Visit | Attending: Surgery | Admitting: Surgery

## 2020-09-30 ENCOUNTER — Other Ambulatory Visit: Payer: Self-pay

## 2020-09-30 DIAGNOSIS — E042 Nontoxic multinodular goiter: Secondary | ICD-10-CM | POA: Diagnosis present

## 2020-10-12 ENCOUNTER — Telehealth: Payer: Self-pay | Admitting: Internal Medicine

## 2020-10-12 NOTE — Telephone Encounter (Signed)
Patient has been notified and appointment scheduled.

## 2020-10-12 NOTE — Telephone Encounter (Signed)
Patient is calling in to see if it is ok for her to get her flu shot at her 12/15 appointment or to come in sooner for a flu shot nurse visit.Please advise.

## 2020-10-12 NOTE — Telephone Encounter (Signed)
Ok to schedule nurse visit for flu vaccine.

## 2020-10-13 ENCOUNTER — Ambulatory Visit (INDEPENDENT_AMBULATORY_CARE_PROVIDER_SITE_OTHER): Payer: Medicare Other

## 2020-10-13 ENCOUNTER — Other Ambulatory Visit: Payer: Self-pay

## 2020-10-13 DIAGNOSIS — Z23 Encounter for immunization: Secondary | ICD-10-CM

## 2020-11-03 ENCOUNTER — Other Ambulatory Visit: Payer: Self-pay | Admitting: Internal Medicine

## 2020-11-24 ENCOUNTER — Telehealth: Payer: Self-pay | Admitting: Internal Medicine

## 2020-11-24 NOTE — Telephone Encounter (Signed)
Spoke with patients husband. She is currently at [facility] in. Will f/u with her after. Triage note from access nurse attached

## 2020-11-24 NOTE — Telephone Encounter (Signed)
Patient is complaining of her rt side hurting, it hurts more at  night when laying down. Pain travels from under rt breast to the back. Patient was transfer to Access Nurse.

## 2020-11-24 NOTE — Telephone Encounter (Signed)
Noted.  Agree with need for evaluation.   

## 2020-11-25 NOTE — Telephone Encounter (Signed)
Pt called in to touch base with PCP/ CMA. Pt states she was called while she was in [place] clinic and was returning the call.

## 2020-11-25 NOTE — Telephone Encounter (Signed)
Spoke with patient, confirmed she is feeling better. Still waiting for notes in care everywhere. Has f/u with PCP in a few weeks. Will call if she needs anything.

## 2020-12-21 ENCOUNTER — Other Ambulatory Visit: Payer: Self-pay

## 2020-12-21 ENCOUNTER — Other Ambulatory Visit (INDEPENDENT_AMBULATORY_CARE_PROVIDER_SITE_OTHER): Payer: Medicare Other

## 2020-12-21 DIAGNOSIS — D509 Iron deficiency anemia, unspecified: Secondary | ICD-10-CM | POA: Diagnosis not present

## 2020-12-21 DIAGNOSIS — E875 Hyperkalemia: Secondary | ICD-10-CM

## 2020-12-21 DIAGNOSIS — E7800 Pure hypercholesterolemia, unspecified: Secondary | ICD-10-CM | POA: Diagnosis not present

## 2020-12-21 DIAGNOSIS — I10 Essential (primary) hypertension: Secondary | ICD-10-CM

## 2020-12-21 LAB — LIPID PANEL
Cholesterol: 174 mg/dL (ref 0–200)
HDL: 51.8 mg/dL (ref >39.00)
LDL Cholesterol: 108 mg/dL — ABNORMAL HIGH (ref 0–99)
NonHDL: 122.32
Total CHOL/HDL Ratio: 3
Triglycerides: 73 mg/dL (ref 0.0–149.0)
VLDL: 14.6 mg/dL (ref 0.0–40.0)

## 2020-12-21 LAB — BASIC METABOLIC PANEL
BUN: 23 mg/dL (ref 6–23)
CO2: 28 mEq/L (ref 19–32)
Calcium: 9.7 mg/dL (ref 8.4–10.5)
Chloride: 103 mEq/L (ref 96–112)
Creatinine, Ser: 0.89 mg/dL (ref 0.40–1.20)
GFR: 59.83 mL/min — ABNORMAL LOW (ref >60.00)
Glucose, Bld: 112 mg/dL — ABNORMAL HIGH (ref 70–99)
Potassium: 4.7 mEq/L (ref 3.5–5.1)
Sodium: 137 mEq/L (ref 135–145)

## 2020-12-21 LAB — HEMOGLOBIN A1C: Hgb A1c MFr Bld: 6.7 % — ABNORMAL HIGH (ref 4.6–6.5)

## 2020-12-21 LAB — CBC WITH DIFFERENTIAL/PLATELET
Basophils Absolute: 0.1 10*3/uL (ref 0.0–0.1)
Basophils Relative: 0.9 % (ref 0.0–3.0)
Eosinophils Absolute: 0.2 10*3/uL (ref 0.0–0.7)
Eosinophils Relative: 2 % (ref 0.0–5.0)
HCT: 35.6 % — ABNORMAL LOW (ref 36.0–46.0)
Hemoglobin: 11.1 g/dL — ABNORMAL LOW (ref 12.0–15.0)
Lymphocytes Relative: 27.5 % (ref 12.0–46.0)
Lymphs Abs: 2.2 10*3/uL (ref 0.7–4.0)
MCHC: 31.3 g/dL (ref 30.0–36.0)
MCV: 78 fl (ref 78.0–100.0)
Monocytes Absolute: 0.6 10*3/uL (ref 0.1–1.0)
Monocytes Relative: 7.8 % (ref 3.0–12.0)
Neutro Abs: 5 10*3/uL (ref 1.4–7.7)
Neutrophils Relative %: 61.8 % (ref 43.0–77.0)
Platelets: 289 10*3/uL (ref 150.0–400.0)
RBC: 4.56 Mil/uL (ref 3.87–5.11)
RDW: 17 % — ABNORMAL HIGH (ref 11.5–15.5)
WBC: 8.1 10*3/uL (ref 4.0–10.5)

## 2020-12-21 LAB — HEPATIC FUNCTION PANEL
ALT: 12 U/L (ref 0–35)
AST: 13 U/L (ref 0–37)
Albumin: 4.3 g/dL (ref 3.5–5.2)
Alkaline Phosphatase: 50 U/L (ref 39–117)
Bilirubin, Direct: 0.1 mg/dL (ref 0.0–0.3)
Total Bilirubin: 0.5 mg/dL (ref 0.2–1.2)
Total Protein: 7.4 g/dL (ref 6.0–8.3)

## 2020-12-21 LAB — FERRITIN: Ferritin: 99.5 ng/mL (ref 10.0–291.0)

## 2020-12-23 ENCOUNTER — Other Ambulatory Visit: Payer: Self-pay

## 2020-12-23 ENCOUNTER — Encounter: Payer: Self-pay | Admitting: Internal Medicine

## 2020-12-23 ENCOUNTER — Ambulatory Visit (INDEPENDENT_AMBULATORY_CARE_PROVIDER_SITE_OTHER): Payer: Medicare Other | Admitting: Internal Medicine

## 2020-12-23 VITALS — BP 130/68 | HR 87 | Ht 62.21 in | Wt 135.6 lb

## 2020-12-23 DIAGNOSIS — I10 Essential (primary) hypertension: Secondary | ICD-10-CM | POA: Diagnosis not present

## 2020-12-23 DIAGNOSIS — D649 Anemia, unspecified: Secondary | ICD-10-CM

## 2020-12-23 DIAGNOSIS — R200 Anesthesia of skin: Secondary | ICD-10-CM | POA: Diagnosis not present

## 2020-12-23 DIAGNOSIS — E039 Hypothyroidism, unspecified: Secondary | ICD-10-CM

## 2020-12-23 DIAGNOSIS — R202 Paresthesia of skin: Secondary | ICD-10-CM

## 2020-12-23 DIAGNOSIS — E1165 Type 2 diabetes mellitus with hyperglycemia: Secondary | ICD-10-CM

## 2020-12-23 DIAGNOSIS — I700 Atherosclerosis of aorta: Secondary | ICD-10-CM

## 2020-12-23 DIAGNOSIS — Z0000 Encounter for general adult medical examination without abnormal findings: Secondary | ICD-10-CM

## 2020-12-23 DIAGNOSIS — D509 Iron deficiency anemia, unspecified: Secondary | ICD-10-CM | POA: Diagnosis not present

## 2020-12-23 DIAGNOSIS — E7800 Pure hypercholesterolemia, unspecified: Secondary | ICD-10-CM

## 2020-12-23 DIAGNOSIS — S22000A Wedge compression fracture of unspecified thoracic vertebra, initial encounter for closed fracture: Secondary | ICD-10-CM

## 2020-12-23 MED ORDER — DAPAGLIFLOZIN PROPANEDIOL 5 MG PO TABS: 5.0000 mg | ORAL_TABLET | Freq: Every day | ORAL | 1 refills | Status: DC

## 2020-12-23 NOTE — Progress Notes (Signed)
[name], female   DOB: 10/11/1935, 85 y.o.   MRN: [id number]   Subjective:    [name], female    DOB: Sep 30, 1935, 85 y.o.   MRN: [id number]  This visit occurred during the SARS-CoV-2 public health emergency.  Safety protocols were in place, including screening questions prior to the visit, additional usage of staff PPE, and extensive cleaning of exam room while observing appropriate contact time as indicated for disinfecting solutions.   Patient here for her physical exam.   Chief Complaint  Patient presents with   Annual Exam   .   HPI Was seen recently at acute care for back pain.  W/up included xray - T6 compression fracture.  Was given baclofen and meloxicam.  Baclofen caused decreased appetite and meloxicam - did not help.  Pain - catches.  No pain with deep breathing.  No chest pain or sob reported.  No abdominal pain.  Some numbness in legs.  Has neuropathy.  Is concerned that metformin is aggravated her legs.  Wants to stop the medication.  Interested in changing medication.  Saw Dr Gabriel Carina for f/u multinodular goiter.  Recommended f/u ultrasound in 12 months.     Past Medical History:  Diagnosis Date   Anemia    Diabetes mellitus ([facility])    GERD (gastroesophageal reflux disease)    Hypercholesterolemia    Hypertension    Hypothyroidism    Osteoporosis    Past Surgical History:  Procedure Laterality Date   COLONOSCOPY     COLONOSCOPY WITH PROPOFOL N/A 11/09/2017   Procedure: COLONOSCOPY WITH PROPOFOL;  Surgeon: Lollie Sails, MD;  Location: [facility] ENDOSCOPY;  Service: Endoscopy;  Laterality: N/A;   CYST EXCISION     low back   ESOPHAGOGASTRODUODENOSCOPY (EGD) WITH PROPOFOL N/A 11/09/2017   Procedure: ESOPHAGOGASTRODUODENOSCOPY (EGD) WITH PROPOFOL;  Surgeon: Lollie Sails, MD;  Location: [facility] ENDOSCOPY;  Service: Endoscopy;  Laterality: N/A;   THYROID SURGERY  1992   left hemi-thyroidectomy (colloid goiter0   UPPER GASTROINTESTINAL  ENDOSCOPY     Family History  Problem Relation Age of Onset   Emphysema Father    Heart disease Mother        myocardial infarction (68)   Hypertension Mother    Diabetes Mother    Heart disease Brother        drug use   Diabetes Mellitus II Sister        x3   Hypertension Sister    Breast cancer Other        niece   Breast cancer Sister    Cancer Sister    Colon cancer Neg Hx    Social History   Socioeconomic History   Marital status: Married    Spouse name: Not on file   Number of children: 2   Years of education: Not on file   Highest education level: Not on file  Occupational History   Not on file  Tobacco Use   Smoking status: Never   Smokeless tobacco: Never  Vaping Use   Vaping Use: Never used  Substance and Sexual Activity   Alcohol use: No    Alcohol/week: 0.0 standard drinks    Comment: occasional   Drug use: Never   Sexual activity: Not on file  Other Topics Concern   Not on file  Social History Narrative   In [place]; with husband; never smoked;  no alcohol; telephone receptionist.    Social Determinants of Radio broadcast assistant  Strain: Low Risk    Difficulty of Paying Living Expenses: Not hard at all  Food Insecurity: No Food Insecurity   Worried About Charity fundraiser in the Last Year: Never true   Ran Out of Food in the Last Year: Never true  Transportation Needs: No Transportation Needs   Lack of Transportation (Medical): No   Lack of Transportation (Non-Medical): No  Physical Activity: Not on file  Stress: No Stress Concern Present   Feeling of Stress : Not at all  Social Connections: Unknown   Frequency of Communication with Friends and Family: More than three times a week   Frequency of Social Gatherings with Friends and Family: Not on file   Attends Religious Services: Not on file   Active Member of Clubs or Organizations: Not on file   Attends Archivist Meetings: Not on file   Marital Status: Not on file      Review of Systems  Constitutional:  Negative for appetite change and unexpected weight change.  HENT:  Negative for congestion, sinus pressure and sore throat.   Eyes:  Negative for pain and visual disturbance.  Respiratory:  Negative for cough, chest tightness and shortness of breath.   Cardiovascular:  Negative for chest pain, palpitations and leg swelling.  Gastrointestinal:  Negative for abdominal pain, diarrhea, nausea and vomiting.  Genitourinary:  Negative for difficulty urinating and dysuria.  Musculoskeletal:  Positive for back pain. Negative for joint swelling and myalgias.       Leg issues as outlined.   Skin:  Negative for color change and rash.  Neurological:  Negative for dizziness, light-headedness and headaches.  Hematological:  Negative for adenopathy. Does not bruise/bleed easily.  Psychiatric/Behavioral:  Negative for agitation and dysphoric mood.       Objective:     BP 130/68    Pulse 87    Ht 5' 2.21" (1.58 m)    Wt 135 lb 9.6 oz (61.5 kg)    SpO2 96%    BMI 24.64 kg/m  Wt Readings from Last 3 Encounters:  12/23/20 135 lb 9.6 oz (61.5 kg)  08/24/20 138 lb (62.6 kg)  06/23/20 137 lb (62.1 kg)    Physical Exam Vitals reviewed.  Constitutional:      General: She is not in acute distress.    Appearance: Normal appearance. She is well-developed.  HENT:     Head: Normocephalic and atraumatic.     Right Ear: External ear normal.     Left Ear: External ear normal.  Eyes:     General: No scleral icterus.       Right eye: No discharge.        Left eye: No discharge.     Conjunctiva/sclera: Conjunctivae normal.  Neck:     Thyroid: No thyromegaly.  Cardiovascular:     Rate and Rhythm: Normal rate and regular rhythm.  Pulmonary:     Effort: No tachypnea, accessory muscle usage or respiratory distress.     Breath sounds: Normal breath sounds. No decreased breath sounds or wheezing.  Chest:  Breasts:    Right: No inverted nipple, mass, nipple discharge  or tenderness (no axillary adenopathy).     Left: No inverted nipple, mass, nipple discharge or tenderness (no axilarry adenopathy).  Abdominal:     General: Bowel sounds are normal.     Palpations: Abdomen is soft.     Tenderness: There is no abdominal tenderness.  Musculoskeletal:        General: No  swelling or tenderness.     Cervical back: Neck supple. No tenderness.  Lymphadenopathy:     Cervical: No cervical adenopathy.  Skin:    Findings: No erythema or rash.  Neurological:     Mental Status: She is alert and oriented to person, place, and time.  Psychiatric:        Mood and Affect: Mood normal.        Behavior: Behavior normal.     Outpatient Encounter Medications as of 12/23/2020  Medication Sig   ACCU-CHEK AVIVA PLUS test strip USE TO CHECK BLOOD SUGAR TWICE A DAY   ACCU-CHEK SOFTCLIX LANCETS lancets CHECK BLOOD SUGAR ONCE A DAY DX E11.9   aspirin 81 MG tablet Take 81 mg by mouth daily.   calcium-vitamin D (OSCAL WITH D) 250-125 MG-UNIT tablet Take 1 tablet by mouth daily.   dapagliflozin propanediol (FARXIGA) 5 MG TABS tablet Take 1 tablet (5 mg total) by mouth daily before breakfast.   ezetimibe (ZETIA) 10 MG tablet TAKE 1 TABLET BY MOUTH EVERY DAY   glipiZIDE (GLUCOTROL XL) 5 MG 24 hr tablet TAKE 1 TABLET DAILY   lisinopril (ZESTRIL) 10 MG tablet TAKE 1 TABLET DAILY   Multiple Vitamin (MULTIVITAMIN) tablet Take 1 tablet by mouth daily.   omeprazole (PRILOSEC) 20 MG capsule TAKE 1 CAPSULE DAILY   [DISCONTINUED] metFORMIN (GLUCOPHAGE) 500 MG tablet TAKE 2 TABLETS TWICE A DAY   No facility-administered encounter medications on file as of 12/23/2020.     Lab Results  Component Value Date   WBC 8.1 12/21/2020   HGB 11.1 (L) 12/21/2020   HCT 35.6 (L) 12/21/2020   PLT 289.0 12/21/2020   GLUCOSE 112 (H) 12/21/2020   CHOL 174 12/21/2020   TRIG 73.0 12/21/2020   HDL 51.80 12/21/2020   LDLCALC 108 (H) 12/21/2020   ALT 12 12/21/2020   AST 13 12/21/2020   NA 137  12/21/2020   K 4.7 12/21/2020   CL 103 12/21/2020   CREATININE 0.89 12/21/2020   BUN 23 12/21/2020   CO2 28 12/21/2020   TSH 0.73 05/13/2020   HGBA1C 6.7 (H) 12/21/2020   MICROALBUR 1.6 02/14/2019    US THYROID  Result Date: 09/30/2020 CLINICAL DATA:  Prior ultrasound follow-up. Right mid thyroid nodule currently under imaging surveillance. History of prior partial thyroidectomy. EXAM: THYROID ULTRASOUND TECHNIQUE: Ultrasound examination of the thyroid gland and adjacent soft tissues was performed. COMPARISON:  Prior thyroid ultrasound 06/19/2019; 12/05/2010 FINDINGS: Parenchymal Echotexture: Markedly heterogenous Isthmus: 1.7 cm Right lobe: 7.9 x 3.5 x 3.9 cm Left lobe: 4.9 x 2.1 x 1.1 cm _________________________________________________________ Estimated total number of nodules >/= 1 cm: 5 Number of spongiform nodules >/=  2 cm not described below (TR1): 0 Number of mixed cystic and solid nodules >/= 1.5 cm not described below (TR2): 0 _________________________________________________________ Nodule #1 and #2: Stable for greater than 5 years dating back to November of 2012. No further follow-up required. Nodule # 3: Small spongiform nodule in the medial right mid gland does not meet criteria for further evaluation. Nodule # 4: Prior biopsy: No Location: Right; Mid Maximum size: 2.1 cm; Other 2 dimensions: 1.8 x 1.0 cm, previously, 1.6 x 1.0 x 1.6 cm Composition: mixed cystic and solid (1) Echogenicity: hypoechoic (2) Shape: not taller-than-wide (0) Margins: smooth (0) Echogenic foci: none (0) ACR TI-RADS total points: 3. ACR TI-RADS risk category:  TR3 (3 points). Significant change in size (>/= 20% in two dimensions and minimal increase of 2 mm): Yes Change in features:  No Change in ACR TI-RADS risk category: No ACR TI-RADS recommendations: *Given size (>/= 1.5 - 2.4 cm) and appearance, a follow-up ultrasound in 1 year should be considered based on TI-RADS criteria.  _________________________________________________________ Nodule # 5: Small isoechoic solid nodule in the left mid gland does not meet criteria for further evaluation. IMPRESSION: 1. Enlarging TI-RADS category 3 nodule in the right mid gland. Nodule has not yet met size criteria to recommend fine-needle aspiration biopsy. Continue annual surveillance imaging. 2. Numerous additional nodules as above which do not meet criteria for biopsy or further imaging surveillance. The above is in keeping with the ACR TI-RADS recommendations - J Am Coll Radiol 2017;14:587-595. Electronically Signed   By: Jacqulynn Cadet M.D.   On: 09/30/2020 16:27       Assessment & Plan:   Problem List Items Addressed This Visit     Anemia   Relevant Orders   Multiple Myeloma Panel (SPEP&IFE w/QIG)   Aortic atherosclerosis ([place])    Intolerant to statins.  Continue Zetia. Low cholesterol diet and exercise.  Follow lipid panel.   Lab Results  Component Value Date   CHOL 174 12/21/2020   HDL 51.80 12/21/2020   LDLCALC 108 (H) 12/21/2020   TRIG 73.0 12/21/2020   CHOLHDL 3 12/21/2020       Compression fracture of body of thoracic vertebra (H[facility])    Back pain as outlined.  Xray with T6 compression fracture.  Gabapentin trial as directed.  Schedule bone density.        Relevant Orders   DG Bone Density   Ambulatory referral to Orthopedic Surgery   Health care maintenance    Physical today 12/23/20.  Colonoscopy 11/2017.  Mammogram 07/16/20 - Birads I.       Hypercholesterolemia    Intolerant to statin medication.  On zetia.  Low cholesterol diet and exercise.  Follow lipid panel.       Hypertension    Continue lisinopril.  Blood pressure as outlined.  On recheck slightly elevated.  Start farxiga.  Stay hydrated.   Follow pressures.  Follow metabolic panel.       Hypothyroidism    History of thyroid nodules and hypothyroidism.  Follow tsh.       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  Follow  cbc.       Numbness and tingling    Has had MRI and NCS - severe bilateral lower extremity polyneuropathy.  She is concerned metformin aggravating her legs.  Will change medication at her request.  Gabapentin as outlined.  Check multiple myeloma panel.  Follow.       Relevant Orders   Multiple Myeloma Panel (SPEP&IFE w/QIG)   Type 2 diabetes mellitus with hyperglycemia ([place])    Currently on metformin and glipizide given elevated blood sugars.  Feels intolerance to metformin.  Will stop metformin.  Start farxiga 45m q day.  Stay hydrated.  Discussed need to hold if sick.  Low carb diet and exercise given elevated blood sugars. Follow met b and a1c.  Lab Results  Component Value Date   HGBA1C 6.7 (H) 12/21/2020       Relevant Medications   dapagliflozin propanediol (FARXIGA) 5 MG TABS tablet   Other Visit Diagnoses     Routine general medical examination at a health care facility    -  Primary        CEinar Pheasant MD

## 2020-12-23 NOTE — Assessment & Plan Note (Signed)
Physical today 12/23/20.  Colonoscopy 11/2017.  Mammogram 07/16/20 - Birads I.

## 2020-12-23 NOTE — Patient Instructions (Signed)
Stop metformin.   Start farxiga - one per day.    Stay hydrated.

## 2020-12-26 ENCOUNTER — Telehealth: Payer: Self-pay | Admitting: Internal Medicine

## 2020-12-26 ENCOUNTER — Encounter: Payer: Self-pay | Admitting: Internal Medicine

## 2020-12-26 DIAGNOSIS — S22000A Wedge compression fracture of unspecified thoracic vertebra, initial encounter for closed fracture: Secondary | ICD-10-CM | POA: Insufficient documentation

## 2020-12-26 HISTORY — DX: Wedge compression fracture of unspecified thoracic vertebra, initial encounter for closed fracture: S22.000A

## 2020-12-26 NOTE — Assessment & Plan Note (Signed)
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  Follow cbc.

## 2020-12-26 NOTE — Assessment & Plan Note (Signed)
Has had MRI and NCS - severe bilateral lower extremity polyneuropathy.  She is concerned metformin aggravating her legs.  Will change medication at her request.  Gabapentin as outlined.  Check multiple myeloma panel.  Follow.

## 2020-12-26 NOTE — Assessment & Plan Note (Signed)
Back pain as outlined.  Xray with T6 compression fracture.  Gabapentin trial as directed.  Schedule bone density.

## 2020-12-26 NOTE — Assessment & Plan Note (Addendum)
Currently on metformin and glipizide given elevated blood sugars.  Feels intolerance to metformin.  Will stop metformin.  Start farxiga 46m q day.  Stay hydrated.  Discussed need to hold if sick.  Low carb diet and exercise given elevated blood sugars. Follow met b and a1c.  Lab Results  Component Value Date   HGBA1C 6.7 (H) 12/21/2020

## 2020-12-26 NOTE — Telephone Encounter (Signed)
Needs bone density scheduled.  Has been ordered.  

## 2020-12-26 NOTE — Assessment & Plan Note (Signed)
History of thyroid nodules and hypothyroidism.  Follow tsh.

## 2020-12-26 NOTE — Assessment & Plan Note (Signed)
Intolerant to statins.  Continue Zetia. Low cholesterol diet and exercise.  Follow lipid panel.   Lab Results  Component Value Date   CHOL 174 12/21/2020   HDL 51.80 12/21/2020   LDLCALC 108 (H) 12/21/2020   TRIG 73.0 12/21/2020   CHOLHDL 3 12/21/2020

## 2020-12-26 NOTE — Assessment & Plan Note (Signed)
Intolerant to statin medication. On zetia.  Low cholesterol diet and exercise.  Follow lipid panel.  

## 2020-12-26 NOTE — Assessment & Plan Note (Signed)
Continue lisinopril.  Blood pressure as outlined.  On recheck slightly elevated.  Start farxiga.  Stay hydrated.   Follow pressures.  Follow metabolic panel.

## 2020-12-27 NOTE — Telephone Encounter (Signed)
Scheduled. LM for patient

## 2020-12-29 ENCOUNTER — Telehealth: Payer: Self-pay | Admitting: Internal Medicine

## 2020-12-30 ENCOUNTER — Telehealth: Payer: Self-pay | Admitting: Internal Medicine

## 2020-12-30 LAB — MULTIPLE MYELOMA PANEL, SERUM
Albumin SerPl Elph-Mcnc: 4.1 g/dL (ref 2.9–4.4)
Albumin/Glob SerPl: 1.2 (ref 0.7–1.7)
Alpha 1: 0.2 g/dL (ref 0.0–0.4)
Alpha2 Glob SerPl Elph-Mcnc: 0.8 g/dL (ref 0.4–1.0)
B-Globulin SerPl Elph-Mcnc: 1.1 g/dL (ref 0.7–1.3)
Gamma Glob SerPl Elph-Mcnc: 1.3 g/dL (ref 0.4–1.8)
Globulin, Total: 3.5 g/dL (ref 2.2–3.9)
IgA/Immunoglobulin A, Serum: 204 mg/dL (ref 64–422)
IgG (Immunoglobin G), Serum: 1424 mg/dL (ref 586–1602)
IgM (Immunoglobulin M), Srm: 115 mg/dL (ref 26–217)
Total Protein: 7.6 g/dL (ref 6.0–8.5)

## 2020-12-30 NOTE — Telephone Encounter (Signed)
Heather from [facility] called in regards to referral she received for pt. She states that there was a compression fracture found however they do not have a provider who is able to do the kyphoplasty should she need it. Nira Conn states she could do the MRI then refer pt to another provider however it would cost the pt more out of pocket.   Nira Conn can be reached at [phone]

## 2020-12-30 NOTE — Telephone Encounter (Signed)
Who should we send new referral to?

## 2020-12-30 NOTE — Telephone Encounter (Signed)
See if she would be agreeable to go to [facility].  If so, ok to refer (Dr Mack Guise).

## 2020-12-31 NOTE — Telephone Encounter (Signed)
Noted  

## 2020-12-31 NOTE — Telephone Encounter (Signed)
Can we send current referral to [facility]- Dr Mack Guise or do you need new referral?

## 2020-12-31 NOTE — Telephone Encounter (Signed)
Thank you :)

## 2021-01-04 ENCOUNTER — Telehealth: Payer: Self-pay | Admitting: Internal Medicine

## 2021-01-04 NOTE — Telephone Encounter (Signed)
Rejection Reason - Other - Dr. Rudene Christians no longer performs sacroplasty or kyphoplasty procedures. He recommends all compression fractures be sent to [facility] for procedure consultation" Wanda Ruiz said on Dec 30, 2020 11:30 AM  Msg from [facility] orthopedic surgery

## 2021-01-04 NOTE — Telephone Encounter (Signed)
Per 12/30/20 phone note, referral has been sent to [facility].

## 2021-01-12 NOTE — Telephone Encounter (Signed)
If pt desires to wait, will hold on referral.  If persistent pain or feel needs to be seen, I can re do referral if needed.

## 2021-01-13 NOTE — Telephone Encounter (Signed)
Thank you. I appreciate it. 

## 2021-01-18 ENCOUNTER — Other Ambulatory Visit: Payer: Self-pay | Admitting: Internal Medicine

## 2021-01-19 NOTE — Telephone Encounter (Signed)
Pt called in stating that she had change her medication from metformin to (dapagliflozin propanediol (FARXIGA) 5 MG TABS tablet). Pt stated that medication (dapagliflozin propanediol (FARXIGA) 5 MG TABS tablet) is not working. Pt stated that her sugar has been higher since she started the medication. Pt was wondering if she can try another medication that will work. Pt requesting callback

## 2021-01-25 NOTE — Telephone Encounter (Signed)
Error

## 2021-02-02 ENCOUNTER — Other Ambulatory Visit: Payer: Medicare Other

## 2021-02-02 ENCOUNTER — Telehealth: Payer: Self-pay

## 2021-02-02 NOTE — Telephone Encounter (Signed)
Spoke to patient regarding her [place]. Patient stopped this medication and has restarted her metformin 500 mg (1000mg  BID) Patient would like to discuss other treatment options for her diabetes. Still taking her glipizide.

## 2021-02-03 ENCOUNTER — Ambulatory Visit: Payer: Medicare Other | Admitting: Internal Medicine

## 2021-02-03 ENCOUNTER — Other Ambulatory Visit: Payer: Self-pay

## 2021-02-03 VITALS — BP 130/72 | HR 71 | Temp 97.2°F | Resp 16 | Ht 62.0 in | Wt 136.0 lb

## 2021-02-03 DIAGNOSIS — E7800 Pure hypercholesterolemia, unspecified: Secondary | ICD-10-CM

## 2021-02-03 DIAGNOSIS — E039 Hypothyroidism, unspecified: Secondary | ICD-10-CM | POA: Diagnosis not present

## 2021-02-03 DIAGNOSIS — D649 Anemia, unspecified: Secondary | ICD-10-CM

## 2021-02-03 DIAGNOSIS — S22000A Wedge compression fracture of unspecified thoracic vertebra, initial encounter for closed fracture: Secondary | ICD-10-CM

## 2021-02-03 DIAGNOSIS — R200 Anesthesia of skin: Secondary | ICD-10-CM

## 2021-02-03 DIAGNOSIS — E1165 Type 2 diabetes mellitus with hyperglycemia: Secondary | ICD-10-CM | POA: Diagnosis not present

## 2021-02-03 DIAGNOSIS — F439 Reaction to severe stress, unspecified: Secondary | ICD-10-CM

## 2021-02-03 DIAGNOSIS — I700 Atherosclerosis of aorta: Secondary | ICD-10-CM

## 2021-02-03 DIAGNOSIS — D509 Iron deficiency anemia, unspecified: Secondary | ICD-10-CM

## 2021-02-03 DIAGNOSIS — I10 Essential (primary) hypertension: Secondary | ICD-10-CM

## 2021-02-03 DIAGNOSIS — R202 Paresthesia of skin: Secondary | ICD-10-CM

## 2021-02-03 MED ORDER — EMPAGLIFLOZIN 10 MG PO TABS: 10.0000 mg | ORAL_TABLET | Freq: Every day | ORAL | 2 refills | Status: DC

## 2021-02-03 NOTE — Patient Instructions (Signed)
Take miralax daily (to help keep your bowels moving)  Remain off farxiga.   Stop metformin.    Start jardiance 10mg  per day.

## 2021-02-03 NOTE — Progress Notes (Signed)
Patient ID: Shea Stakes, female   DOB: 01-18-1935, 86 y.o.   MRN: [id number]   Subjective:    Patient ID: Shea Stakes, female    DOB: 1935-09-05, 86 y.o.   MRN: [id number]  This visit occurred during the SARS-CoV-2 public health emergency.  Safety protocols were in place, including screening questions prior to the visit, additional usage of staff PPE, and extensive cleaning of exam room while observing appropriate contact time as indicated for disinfecting solutions.   Patient here for a scheduled follow up.   Chief Complaint  Patient presents with   Diabetes   .   HPI Recently evaluated for back pain.  Xray - T6 compression fracture.  Back is better now.  Tries to stay active.  No chest pain or sob reported.  No cough or congestion.  No acid reflux or abdominal pian reported.  Was previously concerned that metformin aggravated her legs.  Has known neuropathy.  Metformin was stopped and she was started on farxiga.  Off this now.  Was concerned regarding having to stay hydrated.  Did not appear to have a problem with the medication.  Discussed treatment options.     Past Medical History:  Diagnosis Date   Anemia    Diabetes mellitus ([place])    GERD (gastroesophageal reflux disease)    Hypercholesterolemia    Hypertension    Hypothyroidism    Osteoporosis    Past Surgical History:  Procedure Laterality Date   COLONOSCOPY     COLONOSCOPY WITH PROPOFOL N/A 11/09/2017   Procedure: COLONOSCOPY WITH PROPOFOL;  Surgeon: Lollie Sails, MD;  Location: [facility] ENDOSCOPY;  Service: Endoscopy;  Laterality: N/A;   CYST EXCISION     low back   ESOPHAGOGASTRODUODENOSCOPY (EGD) WITH PROPOFOL N/A 11/09/2017   Procedure: ESOPHAGOGASTRODUODENOSCOPY (EGD) WITH PROPOFOL;  Surgeon: Lollie Sails, MD;  Location: [facility] ENDOSCOPY;  Service: Endoscopy;  Laterality: N/A;   THYROID SURGERY  1992   left hemi-thyroidectomy (colloid goiter0   UPPER GASTROINTESTINAL ENDOSCOPY     Family History   Problem Relation Age of Onset   Emphysema Father    Heart disease Mother        myocardial infarction (72)   Hypertension Mother    Diabetes Mother    Heart disease Brother        drug use   Diabetes Mellitus II Sister        x3   Hypertension Sister    Breast cancer Other        niece   Breast cancer Sister    Cancer Sister    Colon cancer Neg Hx    Social History   Socioeconomic History   Marital status: Married    Spouse name: Not on file   Number of children: 2   Years of education: Not on file   Highest education level: Not on file  Occupational History   Not on file  Tobacco Use   Smoking status: Never   Smokeless tobacco: Never  Vaping Use   Vaping Use: Never used  Substance and Sexual Activity   Alcohol use: No    Alcohol/week: 0.0 standard drinks    Comment: occasional   Drug use: Never   Sexual activity: Not on file  Other Topics Concern   Not on file  Social History Narrative   In [place]; with husband; never smoked;  no alcohol; telephone receptionist.    Social Determinants of Health   Financial Resource Strain: Low Risk    Difficulty  of Paying Living Expenses: Not hard at all  Food Insecurity: No Food Insecurity   Worried About [place] in the Last Year: Never true   [place] in the Last Year: Never true  Transportation Needs: No Transportation Needs   Lack of Transportation (Medical): No   Lack of Transportation (Non-Medical): No  Physical Activity: Not on file  Stress: No Stress Concern Present   Feeling of Stress : Not at all  Social Connections: Unknown   Frequency of Communication with Friends and Family: More than three times a week   Frequency of Social Gatherings with Friends and Family: Not on file   Attends Religious Services: Not on file   Active Member of Clubs or Organizations: Not on file   Attends Archivist Meetings: Not on file   Marital Status: Not on file     Review of Systems   Constitutional:  Negative for appetite change and unexpected weight change.  HENT:  Negative for congestion and sinus pressure.   Respiratory:  Negative for cough, chest tightness and shortness of breath.   Cardiovascular:  Negative for chest pain, palpitations and leg swelling.  Gastrointestinal:  Negative for abdominal pain, diarrhea, nausea and vomiting.  Genitourinary:  Negative for difficulty urinating and dysuria.  Musculoskeletal:  Negative for joint swelling and myalgias.  Skin:  Negative for color change and rash.  Neurological:  Negative for dizziness, light-headedness and headaches.  Psychiatric/Behavioral:  Negative for agitation and dysphoric mood.       Objective:     BP 130/72    Pulse 71    Temp (!) 97.2 F (36.2 C)    Resp 16    Ht 5' 2"  (1.575 m)    Wt 136 lb (61.7 kg)    SpO2 98%    BMI 24.87 kg/m  Wt Readings from Last 3 Encounters:  02/03/21 136 lb (61.7 kg)  12/23/20 135 lb 9.6 oz (61.5 kg)  08/24/20 138 lb (62.6 kg)    Physical Exam Vitals reviewed.  Constitutional:      General: She is not in acute distress.    Appearance: Normal appearance.  HENT:     Head: Normocephalic and atraumatic.     Right Ear: External ear normal.     Left Ear: External ear normal.  Eyes:     General: No scleral icterus.       Right eye: No discharge.        Left eye: No discharge.     Conjunctiva/sclera: Conjunctivae normal.  Neck:     Thyroid: No thyromegaly.  Cardiovascular:     Rate and Rhythm: Normal rate and regular rhythm.  Pulmonary:     Effort: No respiratory distress.     Breath sounds: Normal breath sounds. No wheezing.  Abdominal:     General: Bowel sounds are normal.     Palpations: Abdomen is soft.     Tenderness: There is no abdominal tenderness.  Musculoskeletal:        General: No swelling or tenderness.     Cervical back: Neck supple. No tenderness.  Lymphadenopathy:     Cervical: No cervical adenopathy.  Skin:    Findings: No erythema or  rash.  Neurological:     Mental Status: She is alert.  Psychiatric:        Mood and Affect: Mood normal.        Behavior: Behavior normal.     Outpatient Encounter Medications as of 02/03/2021  Medication Sig   ACCU-CHEK AVIVA PLUS test strip USE TO CHECK BLOOD SUGAR TWICE A DAY   ACCU-CHEK SOFTCLIX LANCETS lancets CHECK BLOOD SUGAR ONCE A DAY DX E11.9   aspirin 81 MG tablet Take 81 mg by mouth daily.   calcium-vitamin D (OSCAL WITH D) 250-125 MG-UNIT tablet Take 1 tablet by mouth daily.   empagliflozin (JARDIANCE) 10 MG TABS tablet Take 1 tablet (10 mg total) by mouth daily before breakfast.   ezetimibe (ZETIA) 10 MG tablet TAKE 1 TABLET BY MOUTH EVERY DAY   glipiZIDE (GLUCOTROL XL) 5 MG 24 hr tablet TAKE 1 TABLET DAILY   lisinopril (ZESTRIL) 10 MG tablet TAKE 1 TABLET DAILY   Multiple Vitamin (MULTIVITAMIN) tablet Take 1 tablet by mouth daily.   omeprazole (PRILOSEC) 20 MG capsule TAKE 1 CAPSULE DAILY   [DISCONTINUED] dapagliflozin propanediol (FARXIGA) 5 MG TABS tablet Take 1 tablet (5 mg total) by mouth daily before breakfast.   [DISCONTINUED] metFORMIN (GLUCOPHAGE) 500 MG tablet Take 1,000 mg by mouth 2 (two) times daily with a meal.   [DISCONTINUED] metFORMIN (GLUCOPHAGE-XR) 500 MG 24 hr tablet Take 500 mg by mouth daily with breakfast.   No facility-administered encounter medications on file as of 02/03/2021.     Lab Results  Component Value Date   WBC 8.1 12/21/2020   HGB 11.1 (L) 12/21/2020   HCT 35.6 (L) 12/21/2020   PLT 289.0 12/21/2020   GLUCOSE 112 (H) 12/21/2020   CHOL 174 12/21/2020   TRIG 73.0 12/21/2020   HDL 51.80 12/21/2020   LDLCALC 108 (H) 12/21/2020   ALT 12 12/21/2020   AST 13 12/21/2020   NA 137 12/21/2020   K 4.7 12/21/2020   CL 103 12/21/2020   CREATININE 0.89 12/21/2020   BUN 23 12/21/2020   CO2 28 12/21/2020   TSH 0.73 05/13/2020   HGBA1C 6.7 (H) 12/21/2020   MICROALBUR 1.6 02/14/2019    US THYROID  Result Date: 09/30/2020 CLINICAL DATA:   Prior ultrasound follow-up. Right mid thyroid nodule currently under imaging surveillance. History of prior partial thyroidectomy. EXAM: THYROID ULTRASOUND TECHNIQUE: Ultrasound examination of the thyroid gland and adjacent soft tissues was performed. COMPARISON:  Prior thyroid ultrasound 06/19/2019; 12/05/2010 FINDINGS: Parenchymal Echotexture: Markedly heterogenous Isthmus: 1.7 cm Right lobe: 7.9 x 3.5 x 3.9 cm Left lobe: 4.9 x 2.1 x 1.1 cm _________________________________________________________ Estimated total number of nodules >/= 1 cm: 5 Number of spongiform nodules >/=  2 cm not described below (TR1): 0 Number of mixed cystic and solid nodules >/= 1.5 cm not described below (TR2): 0 _________________________________________________________ Nodule #1 and #2: Stable for greater than 5 years dating back to November of 2012. No further follow-up required. Nodule # 3: Small spongiform nodule in the medial right mid gland does not meet criteria for further evaluation. Nodule # 4: Prior biopsy: No Location: Right; Mid Maximum size: 2.1 cm; Other 2 dimensions: 1.8 x 1.0 cm, previously, 1.6 x 1.0 x 1.6 cm Composition: mixed cystic and solid (1) Echogenicity: hypoechoic (2) Shape: not taller-than-wide (0) Margins: smooth (0) Echogenic foci: none (0) ACR TI-RADS total points: 3. ACR TI-RADS risk category:  TR3 (3 points). Significant change in size (>/= 20% in two dimensions and minimal increase of 2 mm): Yes Change in features: No Change in ACR TI-RADS risk category: No ACR TI-RADS recommendations: *Given size (>/= 1.5 - 2.4 cm) and appearance, a follow-up ultrasound in 1 year should be considered based on TI-RADS criteria. _________________________________________________________ Nodule # 5: Small isoechoic solid nodule in  the left mid gland does not meet criteria for further evaluation. IMPRESSION: 1. Enlarging TI-RADS category 3 nodule in the right mid gland. Nodule has not yet met size criteria to recommend  fine-needle aspiration biopsy. Continue annual surveillance imaging. 2. Numerous additional nodules as above which do not meet criteria for biopsy or further imaging surveillance. The above is in keeping with the ACR TI-RADS recommendations - J Am Coll Radiol 2017;14:587-595. Electronically Signed   By: Jacqulynn Cadet M.D.   On: 09/30/2020 16:27       Assessment & Plan:   Problem List Items Addressed This Visit     Anemia - Primary   Relevant Orders   CBC with Differential/Platelet   Basic metabolic panel   Aortic atherosclerosis ([place])    Intolerant to statins.  Continue Zetia. Low cholesterol diet and exercise.  Follow lipid panel.   Lab Results  Component Value Date   CHOL 174 12/21/2020   HDL 51.80 12/21/2020   LDLCALC 108 (H) 12/21/2020   TRIG 73.0 12/21/2020   CHOLHDL 3 12/21/2020       Compression fracture of body of thoracic vertebra (HCC)    Back is better.  Bone density scheduled for 02/14/21.       Hypercholesterolemia    Intolerant to statin medication.  On zetia.  Low cholesterol diet and exercise.  Follow lipid panel.       Relevant Orders   Lipid panel   Hepatic function panel   Hypertension    Continue lisinopril.  Blood pressure as outlined.   Start jardiance.  Stay hydrated.   Follow pressures.  Follow metabolic panel.       Hypothyroidism    Previously evaluated by Dr Gabriel Carina.  Recommended f/u in 12 months.  Follow tsh.       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  Follow cbc.       Numbness and tingling    Has had MRI and NCS - severe bilateral lower extremity polyneuropathy. Gabapentin as outlined.        Stress    Overall appears to be handling things well. Does not feel needs any further intervention.  Follow.       Type 2 diabetes mellitus with hyperglycemia (HCC)    Currently on metformin and glipizide given elevated blood sugars.  Feels intolerance to metformin.  Will stop metformin.  Previously  tried farxiga 7m q day.  Off now.  Will start jardiance. Discussed same class of medication.  Stay hydrated.  Discussed need to hold if sick.  Low carb diet and exercise given elevated blood sugars. Follow met b and a1c.  Lab Results  Component Value Date   HGBA1C 6.7 (H) 12/21/2020       Relevant Medications   empagliflozin (JARDIANCE) 10 MG TABS tablet   Other Relevant Orders   Hemoglobin A1c     CEinar Pheasant MD

## 2021-02-13 ENCOUNTER — Encounter: Payer: Self-pay | Admitting: Internal Medicine

## 2021-02-13 NOTE — Assessment & Plan Note (Signed)
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  Follow cbc.

## 2021-02-13 NOTE — Assessment & Plan Note (Signed)
Continue lisinopril.  Blood pressure as outlined.   Start jardiance.  Stay hydrated.   Follow pressures.  Follow metabolic panel.

## 2021-02-13 NOTE — Assessment & Plan Note (Signed)
Previously evaluated by Dr Solum.  Recommended f/u in 12 months.  Follow tsh.  

## 2021-02-13 NOTE — Assessment & Plan Note (Signed)
Back is better.  Bone density scheduled for 02/14/21.

## 2021-02-13 NOTE — Assessment & Plan Note (Signed)
Overall appears to be handling things well.  Does not feel needs any further intervention. Follow.  

## 2021-02-13 NOTE — Assessment & Plan Note (Signed)
Currently on metformin and glipizide given elevated blood sugars.  Feels intolerance to metformin.  Will stop metformin.  Previously tried farxiga 45m q day.  Off now.  Will start jardiance. Discussed same class of medication.  Stay hydrated.  Discussed need to hold if sick.  Low carb diet and exercise given elevated blood sugars. Follow met b and a1c.  Lab Results  Component Value Date   HGBA1C 6.7 (H) 12/21/2020

## 2021-02-13 NOTE — Assessment & Plan Note (Signed)
Intolerant to statin medication. On zetia.  Low cholesterol diet and exercise.  Follow lipid panel.  

## 2021-02-13 NOTE — Assessment & Plan Note (Signed)
Has had MRI and NCS - severe bilateral lower extremity polyneuropathy. Gabapentin as outlined.

## 2021-02-13 NOTE — Assessment & Plan Note (Signed)
Intolerant to statins.  Continue Zetia. Low cholesterol diet and exercise.  Follow lipid panel.   Lab Results  Component Value Date   CHOL 174 12/21/2020   HDL 51.80 12/21/2020   LDLCALC 108 (H) 12/21/2020   TRIG 73.0 12/21/2020   CHOLHDL 3 12/21/2020

## 2021-02-14 ENCOUNTER — Other Ambulatory Visit: Payer: Self-pay

## 2021-02-14 ENCOUNTER — Ambulatory Visit
Admission: RE | Admit: 2021-02-14 | Discharge: 2021-02-14 | Disposition: A | Discharge status: Home / Self Care | Payer: Medicare Other | Source: Ambulatory Visit | Attending: Internal Medicine | Admitting: Internal Medicine

## 2021-02-14 DIAGNOSIS — S22000A Wedge compression fracture of unspecified thoracic vertebra, initial encounter for closed fracture: Secondary | ICD-10-CM | POA: Diagnosis present

## 2021-03-03 ENCOUNTER — Other Ambulatory Visit: Payer: Self-pay | Admitting: Internal Medicine

## 2021-03-10 LAB — HM DIABETES EYE EXAM

## 2021-03-15 ENCOUNTER — Other Ambulatory Visit: Payer: Self-pay | Admitting: Internal Medicine

## 2021-03-22 ENCOUNTER — Other Ambulatory Visit (INDEPENDENT_AMBULATORY_CARE_PROVIDER_SITE_OTHER): Payer: Medicare Other

## 2021-03-22 ENCOUNTER — Other Ambulatory Visit: Payer: Self-pay

## 2021-03-22 DIAGNOSIS — E1165 Type 2 diabetes mellitus with hyperglycemia: Secondary | ICD-10-CM | POA: Diagnosis not present

## 2021-03-22 DIAGNOSIS — E7800 Pure hypercholesterolemia, unspecified: Secondary | ICD-10-CM | POA: Diagnosis not present

## 2021-03-22 DIAGNOSIS — E039 Hypothyroidism, unspecified: Secondary | ICD-10-CM | POA: Diagnosis not present

## 2021-03-22 DIAGNOSIS — D649 Anemia, unspecified: Secondary | ICD-10-CM

## 2021-03-22 LAB — CBC WITH DIFFERENTIAL/PLATELET
Basophils Absolute: 0.1 10*3/uL (ref 0.0–0.1)
Basophils Relative: 0.9 % (ref 0.0–3.0)
Eosinophils Absolute: 0.1 10*3/uL (ref 0.0–0.7)
Eosinophils Relative: 2.3 % (ref 0.0–5.0)
HCT: 36.5 % (ref 36.0–46.0)
Hemoglobin: 11.5 g/dL — ABNORMAL LOW (ref 12.0–15.0)
Lymphocytes Relative: 26.8 % (ref 12.0–46.0)
Lymphs Abs: 1.6 10*3/uL (ref 0.7–4.0)
MCHC: 31.7 g/dL (ref 30.0–36.0)
MCV: 77.8 fl — ABNORMAL LOW (ref 78.0–100.0)
Monocytes Absolute: 0.6 10*3/uL (ref 0.1–1.0)
Monocytes Relative: 9.7 % (ref 3.0–12.0)
Neutro Abs: 3.7 10*3/uL (ref 1.4–7.7)
Neutrophils Relative %: 60.3 % (ref 43.0–77.0)
Platelets: 235 10*3/uL (ref 150.0–400.0)
RBC: 4.69 Mil/uL (ref 3.87–5.11)
RDW: 17.2 % — ABNORMAL HIGH (ref 11.5–15.5)
WBC: 6.1 10*3/uL (ref 4.0–10.5)

## 2021-03-22 LAB — HEPATIC FUNCTION PANEL
ALT: 10 U/L (ref 0–35)
AST: 14 U/L (ref 0–37)
Albumin: 4.1 g/dL (ref 3.5–5.2)
Alkaline Phosphatase: 44 U/L (ref 39–117)
Bilirubin, Direct: 0.1 mg/dL (ref 0.0–0.3)
Total Bilirubin: 0.5 mg/dL (ref 0.2–1.2)
Total Protein: 6.7 g/dL (ref 6.0–8.3)

## 2021-03-22 LAB — BASIC METABOLIC PANEL
BUN: 19 mg/dL (ref 6–23)
CO2: 26 mEq/L (ref 19–32)
Calcium: 9.5 mg/dL (ref 8.4–10.5)
Chloride: 104 mEq/L (ref 96–112)
Creatinine, Ser: 0.85 mg/dL (ref 0.40–1.20)
GFR: 63.12 mL/min (ref >60.00)
Glucose, Bld: 99 mg/dL (ref 70–99)
Potassium: 3.9 mEq/L (ref 3.5–5.1)
Sodium: 137 mEq/L (ref 135–145)

## 2021-03-22 LAB — LIPID PANEL
Cholesterol: 169 mg/dL (ref 0–200)
HDL: 49 mg/dL (ref >39.00)
LDL Cholesterol: 104 mg/dL — ABNORMAL HIGH (ref 0–99)
NonHDL: 119.73
Total CHOL/HDL Ratio: 3
Triglycerides: 77 mg/dL (ref 0.0–149.0)
VLDL: 15.4 mg/dL (ref 0.0–40.0)

## 2021-03-22 LAB — HEMOGLOBIN A1C: Hgb A1c MFr Bld: 6.9 % — ABNORMAL HIGH (ref 4.6–6.5)

## 2021-03-22 LAB — TSH: TSH: 0.64 u[IU]/mL (ref 0.35–5.50)

## 2021-03-25 ENCOUNTER — Ambulatory Visit: Payer: Medicare Other | Admitting: Internal Medicine

## 2021-03-25 ENCOUNTER — Other Ambulatory Visit: Payer: Self-pay

## 2021-03-25 ENCOUNTER — Encounter: Payer: Self-pay | Admitting: Internal Medicine

## 2021-03-25 DIAGNOSIS — I700 Atherosclerosis of aorta: Secondary | ICD-10-CM

## 2021-03-25 DIAGNOSIS — E1165 Type 2 diabetes mellitus with hyperglycemia: Secondary | ICD-10-CM

## 2021-03-25 DIAGNOSIS — E039 Hypothyroidism, unspecified: Secondary | ICD-10-CM

## 2021-03-25 DIAGNOSIS — M8580 Other specified disorders of bone density and structure, unspecified site: Secondary | ICD-10-CM

## 2021-03-25 DIAGNOSIS — R109 Unspecified abdominal pain: Secondary | ICD-10-CM

## 2021-03-25 DIAGNOSIS — G720 Drug-induced myopathy: Secondary | ICD-10-CM

## 2021-03-25 DIAGNOSIS — I10 Essential (primary) hypertension: Secondary | ICD-10-CM | POA: Diagnosis not present

## 2021-03-25 DIAGNOSIS — E7800 Pure hypercholesterolemia, unspecified: Secondary | ICD-10-CM

## 2021-03-25 DIAGNOSIS — D649 Anemia, unspecified: Secondary | ICD-10-CM

## 2021-03-25 DIAGNOSIS — M79606 Pain in leg, unspecified: Secondary | ICD-10-CM

## 2021-03-25 DIAGNOSIS — T466X5A Adverse effect of antihyperlipidemic and antiarteriosclerotic drugs, initial encounter: Secondary | ICD-10-CM

## 2021-03-25 MED ORDER — EMPAGLIFLOZIN 25 MG PO TABS: 25.0000 mg | ORAL_TABLET | Freq: Every day | ORAL | 2 refills | Status: DC

## 2021-03-25 NOTE — Patient Instructions (Signed)
Increase jardiance to '25mg'$  per day.  ?

## 2021-03-25 NOTE — Progress Notes (Signed)
[name], female   DOB: 06-08-1935, 86 y.o.   MRN: [id number] ? ? ?Subjective:  ? ? [name], female    DOB: 07-08-1935, 86 y.o.   MRN: [id number] ? ?This visit occurred during the SARS-CoV-2 public health emergency.  Safety protocols were in place, including screening questions prior to the visit, additional usage of staff PPE, and extensive cleaning of exam room while observing appropriate contact time as indicated for disinfecting solutions.  ? ?Patient here for a scheduled follow up.  ? ?Chief Complaint  ?Patient presents with  ? Diabetes  ? Hypertension  ? Hyperlipidemia  ? .  ? ?HPI ?Started on jardiance last visit.  Tolerating.  No problems.  Reports her legs are doing better.  Side pain better.  Discussed exercise.  No chest pain or sob reported.  No abdominal pain or bowel change reported.  Brought in blood pressure cuff.  Accurate with our cuff. Discussed labs.  A1c 6.9. just had eye exam - Dr Wallace Going.  States everything checked out ok.   ? ? ?Past Medical History:  ?Diagnosis Date  ? Anemia   ? Diabetes mellitus ([place])   ? GERD (gastroesophageal reflux disease)   ? Hypercholesterolemia   ? Hypertension   ? Hypothyroidism   ? Osteoporosis   ? ?Past Surgical History:  ?Procedure Laterality Date  ? COLONOSCOPY    ? COLONOSCOPY WITH PROPOFOL N/A 11/09/2017  ? Procedure: COLONOSCOPY WITH PROPOFOL;  Surgeon: Lollie Sails, MD;  Location[facility] ENDOSCOPY;  Service: Endoscopy;  Laterality: N/A;  ? CYST EXCISION    ? low back  ? ESOPHAGOGASTRODUODENOSCOPY (EGD) WITH PROPOFOL N/A 11/09/2017  ? Procedure: ESOPHAGOGASTRODUODENOSCOPY (EGD) WITH PROPOFOL;  Surgeon: Lollie Sails, MD;  Location: [facility] ENDOSCOPY;  Service: Endoscopy;  Laterality: N/A;  ? THYROID SURGERY  1992  ? left hemi-thyroidectomy (colloid goiter0  ? UPPER GASTROINTESTINAL ENDOSCOPY    ? ?Family History  ?Problem Relation Age of Onset  ? Emphysema Father   ? Heart disease Mother   ?     myocardial infarction  (69)  ? Hypertension Mother   ? Diabetes Mother   ? Heart disease Brother   ?     drug use  ? Diabetes Mellitus II Sister   ?     x3  ? Hypertension Sister   ? Breast cancer Other   ?     niece  ? Breast cancer Sister   ? Cancer Sister   ? Colon cancer Neg Hx   ? ?Social History  ? ?Socioeconomic History  ? Marital status: Married  ?  Spouse name: Not on file  ? Number of children: 2  ? Years of education: Not on file  ? Highest education level: Not on file  ?Occupational History  ? Not on file  ?Tobacco Use  ? Smoking status: Never  ? Smokeless tobacco: Never  ?Vaping Use  ? Vaping Use: Never used  ?Substance and Sexual Activity  ? Alcohol use: No  ?  Alcohol/week: 0.0 standard drinks  ?  Comment: occasional  ? Drug use: Never  ? Sexual activity: Not on file  ?Other Topics Concern  ? Not on file  ?Social History Narrative  ? In [place]; with husband; never smoked;  no alcohol; telephone receptionist.   ? ?Social Determinants of Health  ? ?Financial Resource Strain: Low Risk   ? Difficulty of Paying Living Expenses: Not hard at all  ?Food Insecurity: No Food Insecurity  ? Worried  About Running Out of Food in the Last Year: Never true  ? Ran Out of Food in the Last Year: Never true  ?Transportation Needs: No Transportation Needs  ? Lack of Transportation (Medical): No  ? Lack of Transportation (Non-Medical): No  ?Physical Activity: Not on file  ?Stress: No Stress Concern Present  ? Feeling of Stress : Not at all  ?Social Connections: Unknown  ? Frequency of Communication with Friends and Family: More than three times a week  ? Frequency of Social Gatherings with Friends and Family: Not on file  ? Attends Religious Services: Not on file  ? Active Member of Clubs or Organizations: Not on file  ? Attends Archivist Meetings: Not on file  ? Marital Status: Not on file  ? ? ? ?Review of Systems  ?Constitutional:  Negative for appetite change and unexpected weight change.  ?HENT:  Negative for congestion and  sinus pressure.   ?Respiratory:  Negative for cough, chest tightness and shortness of breath.   ?Cardiovascular:  Negative for chest pain, palpitations and leg swelling.  ?Gastrointestinal:  Negative for abdominal pain, diarrhea, nausea and vomiting.  ?Genitourinary:  Negative for difficulty urinating and dysuria.  ?Musculoskeletal:  Negative for joint swelling and myalgias.  ?     Pain - left side - lower anterior ribs.   ?Skin:  Negative for color change and rash.  ?Neurological:  Negative for dizziness, light-headedness and headaches.  ?Psychiatric/Behavioral:  Negative for agitation and dysphoric mood.   ? ?   ?Objective:  ?  ? ?BP 136/74   Pulse 79   Temp 98 ?F (36.7 ?C)   Resp 16   Ht '5\' 3"'$  (1.6 m)   Wt 135 lb 3.2 oz (61.3 kg)   SpO2 99%   BMI 23.95 kg/m?  ?Wt Readings from Last 3 Encounters:  ?03/25/21 135 lb 3.2 oz (61.3 kg)  ?02/03/21 136 lb (61.7 kg)  ?12/23/20 135 lb 9.6 oz (61.5 kg)  ? ? ?Physical Exam ?Vitals reviewed.  ?Constitutional:   ?   General: She is not in acute distress. ?   Appearance: Normal appearance.  ?HENT:  ?   Head: Normocephalic and atraumatic.  ?   Right Ear: External ear normal.  ?   Left Ear: External ear normal.  ?Eyes:  ?   General: No scleral icterus.    ?   Right eye: No discharge.     ?   Left eye: No discharge.  ?   Conjunctiva/sclera: Conjunctivae normal.  ?Neck:  ?   Thyroid: No thyromegaly.  ?Cardiovascular:  ?   Rate and Rhythm: Normal rate and regular rhythm.  ?Pulmonary:  ?   Effort: No respiratory distress.  ?   Breath sounds: Normal breath sounds. No wheezing.  ?Abdominal:  ?   General: Bowel sounds are normal.  ?   Palpations: Abdomen is soft.  ?   Tenderness: There is no abdominal tenderness.  ?Musculoskeletal:     ?   General: No swelling.  ?   Cervical back: Neck supple. No tenderness.  ?   Comments: Increased tenderness to palpation left lower anterior rib.   ?Lymphadenopathy:  ?   Cervical: No cervical adenopathy.  ?Skin: ?   Findings: No erythema or  rash.  ?Neurological:  ?   Mental Status: She is alert.  ?Psychiatric:     ?   Mood and Affect: Mood normal.     ?   Behavior: Behavior normal.  ? ? ? ?  Outpatient Encounter Medications as of 03/25/2021  ?Medication Sig  ? empagliflozin (JARDIANCE) 25 MG TABS tablet Take 1 tablet (25 mg total) by mouth daily before breakfast.  ? ACCU-CHEK AVIVA PLUS test strip USE TO CHECK BLOOD SUGAR TWICE A DAY  ? ACCU-CHEK SOFTCLIX LANCETS lancets CHECK BLOOD SUGAR ONCE A DAY DX E11.9  ? aspirin 81 MG tablet Take 81 mg by mouth daily.  ? calcium-vitamin D (OSCAL WITH D) 250-125 MG-UNIT tablet Take 1 tablet by mouth daily.  ? ezetimibe (ZETIA) 10 MG tablet TAKE 1 TABLET BY MOUTH EVERY DAY  ? glipiZIDE (GLUCOTROL XL) 5 MG 24 hr tablet TAKE 1 TABLET DAILY  ? lisinopril (ZESTRIL) 10 MG tablet TAKE 1 TABLET DAILY  ? Multiple Vitamin (MULTIVITAMIN) tablet Take 1 tablet by mouth daily.  ? omeprazole (PRILOSEC) 20 MG capsule TAKE 1 CAPSULE DAILY  ? [DISCONTINUED] JARDIANCE 10 MG TABS tablet TAKE 1 TABLET BY MOUTH DAILY BEFORE BREAKFAST  ? ?No facility-administered encounter medications on file as of 03/25/2021.  ?  ? ?Lab Results  ?Component Value Date  ? WBC 6.1 03/22/2021  ? HGB 11.5 (L) 03/22/2021  ? HCT 36.5 03/22/2021  ? PLT 235.0 03/22/2021  ? GLUCOSE 99 03/22/2021  ? CHOL 169 03/22/2021  ? TRIG 77.0 03/22/2021  ? HDL 49.00 03/22/2021  ? LDLCALC 104 (H) 03/22/2021  ? ALT 10 03/22/2021  ? AST 14 03/22/2021  ? NA 137 03/22/2021  ? K 3.9 03/22/2021  ? CL 104 03/22/2021  ? CREATININE 0.85 03/22/2021  ? BUN 19 03/22/2021  ? CO2 26 03/22/2021  ? TSH 0.64 03/22/2021  ? HGBA1C 6.9 (H) 03/22/2021  ? MICROALBUR 1.6 02/14/2019  ? ? ?DG Bone Density ? ?Result Date: 02/14/2021 ?EXAM: DUAL X-RAY ABSORPTIOMETRY (DXA) FOR BONE MINERAL DENSITY IMPRESSION: Dear Einar Pheasant, Your patient Wanda Ruiz completed a FRAX assessment on 02/14/2021 using the [employer] (analysis version: 14.10) manufactured by [employer]. The following  summarizes the results of our evaluation. PATIENT BIOGRAPHICAL: Name: Wanda, Ruiz Patient ID: [id number] Birth Date: August 13, 1935 Height:    62.0 in. Gender:     Female    [age]       Weight:    136.0 lbs

## 2021-03-26 ENCOUNTER — Encounter: Payer: Self-pay | Admitting: Internal Medicine

## 2021-03-26 DIAGNOSIS — G720 Drug-induced myopathy: Secondary | ICD-10-CM | POA: Insufficient documentation

## 2021-03-26 NOTE — Assessment & Plan Note (Signed)
Intolerant to statin medication. On zetia.  Low cholesterol diet and exercise.  Follow lipid panel.  

## 2021-03-26 NOTE — Assessment & Plan Note (Signed)
Better.  Reproducible on exam.  Discussed further evaluation. Given improvement, wants to monitor.  Follow.  Notify if persistent pain.  ?

## 2021-03-26 NOTE — Assessment & Plan Note (Signed)
F/u bone density - osteopenia.  Continue calcium, vitamin D and weight bearing exercise.  ?

## 2021-03-26 NOTE — Assessment & Plan Note (Signed)
Continue lisinopril.  Blood pressure as outlined.   On jardiance. Will increase to '25mg'$  q day. May help blood pressure.  Stay hydrated.   Follow pressures.  Follow metabolic panel.  ?

## 2021-03-26 NOTE — Assessment & Plan Note (Signed)
Improved

## 2021-03-26 NOTE — Assessment & Plan Note (Signed)
Currently on metformin and glipizide given elevated blood sugars.  Had intolerance to metformin.   Previously tried farxiga 31m q day - intolerance.  On jardiance now.  Tolerating. Increase to 269mq day.  Stay hydrated.  Discussed need to hold if sick.  Low carb diet and exercise given elevated blood sugars. Follow met b and a1c.  ?Lab Results  ?Component Value Date  ? HGBA1C 6.9 (H) 03/22/2021  ? ?

## 2021-03-26 NOTE — Assessment & Plan Note (Signed)
Intolerant to statins.  Continue Zetia. Low cholesterol diet and exercise.  Follow lipid panel.   ?Lab Results  ?Component Value Date  ? CHOL 169 03/22/2021  ? HDL 49.00 03/22/2021  ? LDLCALC 104 (H) 03/22/2021  ? TRIG 77.0 03/22/2021  ? CHOLHDL 3 03/22/2021  ? ?

## 2021-03-26 NOTE — Assessment & Plan Note (Signed)
Intolerant to statin medication.  On zetia. 

## 2021-03-26 NOTE — Assessment & Plan Note (Signed)
Previously evaluated by Dr Solum.  Recommended f/u in 12 months.  Follow tsh.  

## 2021-03-26 NOTE — Assessment & Plan Note (Signed)
Follow cbc.  

## 2021-05-02 ENCOUNTER — Other Ambulatory Visit: Payer: Self-pay | Admitting: Internal Medicine

## 2021-05-04 ENCOUNTER — Ambulatory Visit: Payer: Medicare Other | Admitting: Internal Medicine

## 2021-05-04 ENCOUNTER — Other Ambulatory Visit: Payer: Self-pay

## 2021-05-04 ENCOUNTER — Encounter: Payer: Self-pay | Admitting: Internal Medicine

## 2021-05-04 DIAGNOSIS — E1165 Type 2 diabetes mellitus with hyperglycemia: Secondary | ICD-10-CM | POA: Diagnosis not present

## 2021-05-04 DIAGNOSIS — I700 Atherosclerosis of aorta: Secondary | ICD-10-CM | POA: Diagnosis not present

## 2021-05-04 DIAGNOSIS — G720 Drug-induced myopathy: Secondary | ICD-10-CM

## 2021-05-04 DIAGNOSIS — I10 Essential (primary) hypertension: Secondary | ICD-10-CM

## 2021-05-04 DIAGNOSIS — D649 Anemia, unspecified: Secondary | ICD-10-CM

## 2021-05-04 DIAGNOSIS — T466X5A Adverse effect of antihyperlipidemic and antiarteriosclerotic drugs, initial encounter: Secondary | ICD-10-CM

## 2021-05-04 DIAGNOSIS — E039 Hypothyroidism, unspecified: Secondary | ICD-10-CM

## 2021-05-04 DIAGNOSIS — F439 Reaction to severe stress, unspecified: Secondary | ICD-10-CM

## 2021-05-04 DIAGNOSIS — E7800 Pure hypercholesterolemia, unspecified: Secondary | ICD-10-CM

## 2021-05-04 MED ORDER — ACCU-CHEK GUIDE CONTROL VI LIQD: 0 refills | Status: AC

## 2021-05-04 NOTE — Progress Notes (Signed)
[name], female   DOB: Dec 13, 1935, 86 y.o.   MRN: [id number] ? ? ?Subjective:  ? ? [name], female    DOB: 09-30-1935, 86 y.o.   MRN: [id number] ? ? ?Patient here for a scheduled follow up.  ?  ? ?HPI ?Here to follow up regarding her diabetes, hypertension, hypercholesterolemia and leg pain.  She reports she is doing better.  Sleeping better.  Feet and leg discomfort - improved.  No chest pain or sob reported.  No abdominal pain or bowel change reported.  Tolerating jardiance.  Increased dose to '25mg'$  q day last visit.  Tolerating and feels better.   ? ? ?Past Medical History:  ?Diagnosis Date  ? Anemia   ? Diabetes mellitus ([place])   ? GERD (gastroesophageal reflux disease)   ? Hypercholesterolemia   ? Hypertension   ? Hypothyroidism   ? Osteoporosis   ? ?Past Surgical History:  ?Procedure Laterality Date  ? COLONOSCOPY    ? COLONOSCOPY WITH PROPOFOL N/A 11/09/2017  ? Procedure: COLONOSCOPY WITH PROPOFOL;  Surgeon: Lollie Sails, MD;  Location[facility] ENDOSCOPY;  Service: Endoscopy;  Laterality: N/A;  ? CYST EXCISION    ? low back  ? ESOPHAGOGASTRODUODENOSCOPY (EGD) WITH PROPOFOL N/A 11/09/2017  ? Procedure: ESOPHAGOGASTRODUODENOSCOPY (EGD) WITH PROPOFOL;  Surgeon: Lollie Sails, MD;  Location: [facility] ENDOSCOPY;  Service: Endoscopy;  Laterality: N/A;  ? THYROID SURGERY  1992  ? left hemi-thyroidectomy (colloid goiter0  ? UPPER GASTROINTESTINAL ENDOSCOPY    ? ?Family History  ?Problem Relation Age of Onset  ? Emphysema Father   ? Heart disease Mother   ?     myocardial infarction (69)  ? Hypertension Mother   ? Diabetes Mother   ? Heart disease Brother   ?     drug use  ? Diabetes Mellitus II Sister   ?     x3  ? Hypertension Sister   ? Breast cancer Other   ?     niece  ? Breast cancer Sister   ? Cancer Sister   ? Colon cancer Neg Hx   ? ?Social History  ? ?Socioeconomic History  ? Marital status: Married  ?  Spouse name: Not on file  ? Number of children: 2  ? Years of  education: Not on file  ? Highest education level: Not on file  ?Occupational History  ? Not on file  ?Tobacco Use  ? Smoking status: Never  ? Smokeless tobacco: Never  ?Vaping Use  ? Vaping Use: Never used  ?Substance and Sexual Activity  ? Alcohol use: No  ?  Alcohol/week: 0.0 standard drinks  ?  Comment: occasional  ? Drug use: Never  ? Sexual activity: Not on file  ?Other Topics Concern  ? Not on file  ?Social History Narrative  ? In [place]; with husband; never smoked;  no alcohol; telephone receptionist.   ? ?Social Determinants of Health  ? ?Financial Resource Strain: Low Risk   ? Difficulty of Paying Living Expenses: Not hard at all  ?Food Insecurity: No Food Insecurity  ? Worried About Charity fundraiser in the Last Year: Never true  ? Ran Out of Food in the Last Year: Never true  ?Transportation Needs: No Transportation Needs  ? Lack of Transportation (Medical): No  ? Lack of Transportation (Non-Medical): No  ?Physical Activity: Not on file  ?Stress: No Stress Concern Present  ? Feeling of Stress : Not at all  ?Social Connections: Unknown  ?  Frequency of Communication with Friends and Family: More than three times a week  ? Frequency of Social Gatherings with Friends and Family: Not on file  ? Attends Religious Services: Not on file  ? Active Member of Clubs or Organizations: Not on file  ? Attends Archivist Meetings: Not on file  ? Marital Status: Not on file  ? ? ? ?Review of Systems  ?Constitutional:  Negative for appetite change and unexpected weight change.  ?HENT:  Negative for congestion and sinus pressure.   ?Respiratory:  Negative for cough, chest tightness and shortness of breath.   ?Cardiovascular:  Negative for chest pain, palpitations and leg swelling.  ?Gastrointestinal:  Negative for abdominal pain, diarrhea, nausea and vomiting.  ?Genitourinary:  Negative for difficulty urinating and dysuria.  ?Musculoskeletal:  Negative for joint swelling and myalgias.  ?Skin:  Negative for  color change and rash.  ?Neurological:  Negative for dizziness, light-headedness and headaches.  ?Psychiatric/Behavioral:  Negative for agitation and dysphoric mood.   ? ?   ?Objective:  ?  ? ?BP 122/64 (BP Location: Left Arm, Patient Position: Sitting, Cuff Size: Normal)   Pulse (!) 58   Temp (!) 94.4 ?F (34.7 ?C) (Oral)   Ht '5\' 3"'$  (1.6 m)   Wt 134 lb (60.8 kg)   SpO2 98%   BMI 23.74 kg/m?  ?Wt Readings from Last 3 Encounters:  ?05/04/21 134 lb (60.8 kg)  ?03/25/21 135 lb 3.2 oz (61.3 kg)  ?02/03/21 136 lb (61.7 kg)  ? ? ?Physical Exam ?Vitals reviewed.  ?Constitutional:   ?   General: She is not in acute distress. ?   Appearance: Normal appearance.  ?HENT:  ?   Head: Normocephalic and atraumatic.  ?   Right Ear: External ear normal.  ?   Left Ear: External ear normal.  ?Eyes:  ?   General: No scleral icterus.    ?   Right eye: No discharge.     ?   Left eye: No discharge.  ?   Conjunctiva/sclera: Conjunctivae normal.  ?Neck:  ?   Thyroid: No thyromegaly.  ?Cardiovascular:  ?   Rate and Rhythm: Normal rate and regular rhythm.  ?Pulmonary:  ?   Effort: No respiratory distress.  ?   Breath sounds: Normal breath sounds. No wheezing.  ?Abdominal:  ?   General: Bowel sounds are normal.  ?   Palpations: Abdomen is soft.  ?   Tenderness: There is no abdominal tenderness.  ?Musculoskeletal:     ?   General: No swelling or tenderness.  ?   Cervical back: Neck supple. No tenderness.  ?Lymphadenopathy:  ?   Cervical: No cervical adenopathy.  ?Skin: ?   Findings: No erythema or rash.  ?Neurological:  ?   Mental Status: She is alert.  ?Psychiatric:     ?   Mood and Affect: Mood normal.     ?   Behavior: Behavior normal.  ? ? ? ?Outpatient Encounter Medications as of 05/04/2021  ?Medication Sig  ? ACCU-CHEK AVIVA PLUS test strip USE TO CHECK BLOOD SUGAR TWICE A DAY  ? ACCU-CHEK SOFTCLIX LANCETS lancets CHECK BLOOD SUGAR ONCE A DAY DX E11.9  ? aspirin 81 MG tablet Take 81 mg by mouth daily.  ? calcium-vitamin D (OSCAL WITH  D) 250-125 MG-UNIT tablet Take 1 tablet by mouth daily.  ? empagliflozin (JARDIANCE) 25 MG TABS tablet Take 1 tablet (25 mg total) by mouth daily before breakfast.  ? ezetimibe (ZETIA) 10 MG tablet  TAKE 1 TABLET BY MOUTH EVERY DAY  ? glipiZIDE (GLUCOTROL XL) 5 MG 24 hr tablet TAKE 1 TABLET DAILY  ? lisinopril (ZESTRIL) 10 MG tablet TAKE 1 TABLET DAILY  ? Multiple Vitamin (MULTIVITAMIN) tablet Take 1 tablet by mouth daily.  ? omeprazole (PRILOSEC) 20 MG capsule TAKE 1 CAPSULE DAILY  ? ?No facility-administered encounter medications on file as of 05/04/2021.  ?  ? ?Lab Results  ?Component Value Date  ? WBC 6.1 03/22/2021  ? HGB 11.5 (L) 03/22/2021  ? HCT 36.5 03/22/2021  ? PLT 235.0 03/22/2021  ? GLUCOSE 99 03/22/2021  ? CHOL 169 03/22/2021  ? TRIG 77.0 03/22/2021  ? HDL 49.00 03/22/2021  ? LDLCALC 104 (H) 03/22/2021  ? ALT 10 03/22/2021  ? AST 14 03/22/2021  ? NA 137 03/22/2021  ? K 3.9 03/22/2021  ? CL 104 03/22/2021  ? CREATININE 0.85 03/22/2021  ? BUN 19 03/22/2021  ? CO2 26 03/22/2021  ? TSH 0.64 03/22/2021  ? HGBA1C 6.9 (H) 03/22/2021  ? MICROALBUR 1.6 02/14/2019  ? ? ?DG Bone Density ? ?Result Date: 02/14/2021 ?EXAM: DUAL X-RAY ABSORPTIOMETRY (DXA) FOR BONE MINERAL DENSITY IMPRESSION: Dear Einar Pheasant, Your patient Carry Weesner completed a FRAX assessment on 02/14/2021 using the [place] (analysis version: 14.10) manufactured by [employer]. The following summarizes the results of our evaluation. PATIENT BIOGRAPHICAL: Name: Tyshay, Adee Patient ID: [id number] Birth Date: 1935/03/18 Height:    62.0 in. Gender:     Female    [age]       Weight:    136.0 lbs. Ethnicity:  Black                            Exam Date: 02/14/2021 FRAX* RESULTS:  (version: 3.5) 10-year Probability of Fracture1 Major Osteoporotic Fracture2 Hip Fracture 6.1% 1.5% Population: [place] (Black) Risk Factors: None Based on Femur (Right) Neck BMD 1 -The 10-year probability of fracture may be lower than reported if  the patient has received treatment. 2 -Major Osteoporotic Fracture: Clinical Spine, Forearm, Hip or Shoulder *FRAX is a Materials engineer of the [employer] for Metabolic Bone Disease, a [place]

## 2021-05-15 ENCOUNTER — Encounter: Payer: Self-pay | Admitting: Internal Medicine

## 2021-05-15 NOTE — Assessment & Plan Note (Signed)
Overall appears to be handling things well.  Does not feel needs any further intervention. Follow.  

## 2021-05-15 NOTE — Assessment & Plan Note (Signed)
Continue lisinopril.  Blood pressure as outlined.   On jardiance 25mg q day. Stay hydrated.   Follow pressures.  Follow metabolic panel.  

## 2021-05-15 NOTE — Assessment & Plan Note (Signed)
Off metformin.  Continue jardiance and glipizide.  Tolerating and feels better.  Low carb diet and exercise.  Follow met b and a1c.  ?

## 2021-05-15 NOTE — Assessment & Plan Note (Signed)
Follow cbc.  

## 2021-05-15 NOTE — Assessment & Plan Note (Signed)
Intolerant to statin medication. On zetia.  Low cholesterol diet and exercise.  Follow lipid panel.  

## 2021-05-15 NOTE — Assessment & Plan Note (Signed)
Intolerant to statin medication.  On zetia. 

## 2021-05-15 NOTE — Assessment & Plan Note (Signed)
Intolerant to statins.  Continue Zetia. Low cholesterol diet and exercise.  Follow lipid panel.   ?Lab Results  ?Component Value Date  ? CHOL 169 03/22/2021  ? HDL 49.00 03/22/2021  ? LDLCALC 104 (H) 03/22/2021  ? TRIG 77.0 03/22/2021  ? CHOLHDL 3 03/22/2021  ? ?

## 2021-05-15 NOTE — Assessment & Plan Note (Signed)
Previously evaluated by Dr Solum.  Recommended f/u in 12 months.  Follow tsh.  

## 2021-05-16 ENCOUNTER — Other Ambulatory Visit: Payer: Self-pay | Admitting: Internal Medicine

## 2021-06-17 ENCOUNTER — Other Ambulatory Visit: Payer: Self-pay | Admitting: Internal Medicine

## 2021-06-21 ENCOUNTER — Telehealth (INDEPENDENT_AMBULATORY_CARE_PROVIDER_SITE_OTHER): Payer: Medicare Other | Admitting: Family

## 2021-06-21 ENCOUNTER — Encounter: Payer: Self-pay | Admitting: Family

## 2021-06-21 ENCOUNTER — Telehealth: Payer: Self-pay

## 2021-06-21 DIAGNOSIS — R0981 Nasal congestion: Secondary | ICD-10-CM | POA: Diagnosis not present

## 2021-06-21 NOTE — Progress Notes (Signed)
Onset yesterday..nose dripping....chills. no other symptons . no covid test

## 2021-06-21 NOTE — Assessment & Plan Note (Addendum)
Afebrile. Well appearing. BP extremely elevated and patient had taken lisinopril 10mg  5 minutes before. Advised that she would need in person visit  Particularly to assess blood pressure. Covid test could also be obtained at urgent care. Patient called later in the day to report she had been seen at urgent care.

## 2021-06-21 NOTE — Progress Notes (Unsigned)
Virtual Visit via Video Note  I connected with@  on 06/22/21 at 10:00 AM EDT by a video enabled telemedicine application and verified that I am speaking with the correct person using two identifiers.  Location patient: home Location provider:work  Persons participating in the virtual visit: patient, provider  I discussed the limitations of evaluation and management by telemedicine and the availability of in person appointments. The patient expressed understanding and agreed to proceed.   HPI: Complains of nasal congestion x one day, improvement today. Occasional chill, tactile warmth.   She started allegra and alka seltzer yesterday .   Compliant with lisinopril '10mg'$  and she reports she took medication 5 minutes. No cp, sob, left arm numbness, vision changes, HA.    History of hypertension, hypothyroidism, DM No history of lung disease or covid    ROS: See pertinent positives and negatives per HPI.    EXAM:  VITALS per patient if applicable: BP (!) 161/09   Ht '5\' 3"'$  (1.6 m)   Wt 135 lb 6.4 oz (61.4 kg)   BMI 23.99 kg/m  BP Readings from Last 3 Encounters:  06/21/21 (!) 187/97  05/04/21 122/64  03/25/21 136/74   Wt Readings from Last 3 Encounters:  06/21/21 135 lb 6.4 oz (61.4 kg)  05/04/21 134 lb (60.8 kg)  03/25/21 135 lb 3.2 oz (61.3 kg)    GENERAL: alert, oriented, appears well and in no acute distress HEENT: atraumatic, conjunttiva clear, no obvious abnormalities on inspection of external nose and ears  NECK: normal movements of the head and neck  LUNGS: on inspection no signs of respiratory distress, breathing rate appears normal, no obvious gross SOB, gasping or wheezing  CV: no obvious cyanosis  MS: moves all visible extremities without noticeable abnormality  PSYCH/NEURO: pleasant and cooperative, no obvious depression or anxiety, speech and thought processing grossly intact  ASSESSMENT AND PLAN:  Discussed the following assessment and  plan:  Problem List Items Addressed This Visit       Other   Nasal congestion    Afebrile. Well appearing. BP extremely elevated and patient had taken lisinopril '10mg'$  5 minutes before. Advised that she would need in person visit  Particularly to assess blood pressure. Covid test could also be obtained at urgent care. Patient called later in the day to report she had been seen at urgent care.        -we discussed possible serious and likely etiologies, options for evaluation and workup, limitations of telemedicine visit vs in person visit, treatment, treatment risks and precautions. Pt prefers to treat via telemedicine empirically rather then risking or undertaking an in person visit at this moment.  .   I discussed the assessment and treatment plan with the patient. The patient was provided an opportunity to ask questions and all were answered. The patient agreed with the plan and demonstrated an understanding of the instructions.   The patient was advised to call back or seek an in-person evaluation if the symptoms worsen or if the condition fails to improve as anticipated.   Mable Paris, FNP

## 2021-06-22 NOTE — Telephone Encounter (Signed)
noted 

## 2021-06-22 NOTE — Patient Instructions (Signed)
As discussed, I advise you to be seen at urgent care for in person evaluation of blood pressure and covid test.   Please let me know if you need anything at all.

## 2021-06-28 ENCOUNTER — Ambulatory Visit (INDEPENDENT_AMBULATORY_CARE_PROVIDER_SITE_OTHER): Payer: Medicare Other

## 2021-06-28 VITALS — Ht 63.0 in | Wt 135.0 lb

## 2021-06-28 DIAGNOSIS — Z1231 Encounter for screening mammogram for malignant neoplasm of breast: Secondary | ICD-10-CM

## 2021-06-28 DIAGNOSIS — Z0000 Encounter for general adult medical examination without abnormal findings: Secondary | ICD-10-CM | POA: Diagnosis not present

## 2021-06-28 NOTE — Patient Instructions (Addendum)
Wanda Ruiz , Thank you for taking time to come for your Medicare Wellness Visit. I appreciate your ongoing commitment to your health goals. Please review the following plan we discussed and let me know if I can assist you in the future.   These are the goals we discussed:  Goals       Patient Stated     Maintain Healthy Lifestyle (pt-stated)      Stay active Healthy diet        This is a list of the screening recommended for you and due dates:  Health Maintenance  Topic Date Due   COVID-19 Vaccine (6 - Pfizer series) 07/14/2021*   Tetanus Vaccine  12/23/2021*   Mammogram  07/14/2021   Flu Shot  08/09/2021   Hemoglobin A1C  09/22/2021   Complete foot exam   12/23/2021   Eye exam for diabetics  03/11/2022   Pneumonia Vaccine  Completed   DEXA scan (bone density measurement)  Completed   Zoster (Shingles) Vaccine  Completed   HPV Vaccine  Aged Out  *Topic was postponed. The date shown is not the original due date.    Mammogram due 07/14/21. Ordered. Call [phone] for scheduling.   Mammogram A mammogram is an X-ray of the breasts. This procedure can screen for and detect any changes that may indicate breast cancer. Mammograms are regularly done beginning at age 65 for women with average risk. A man may have a mammogram if he has a lump or swelling in his breast tissue. A mammogram can also identify other changes and variations in the breast, such as: Inflammation of the breast tissue (mastitis). An infected area that contains a collection of pus (abscess). A fluid-filled sac (cyst). Tumors that are not cancerous (benign). Fibrocystic changes. This is when breast tissue becomes denser and can make the tissue feel rope-like or uneven under the skin. Women at higher risk for breast cancer need earlier and more comprehensive screening for abnormal changes. Breast tomosynthesis, or three-dimensional (3D) mammography, and digital breast tomosynthesis are advanced forms of imaging  that create 3D pictures of the breasts. Tell a health care provider: About any allergies you have. If you have breast implants. If you have had previous breast disease, biopsy, or surgery. If you have a family history of breast cancer. If you are breastfeeding. Whether you are pregnant or may be pregnant. What are the risks? Generally, this is a safe procedure. However, problems may occur, including: Exposure to radiation. Radiation levels are very low with this test. The need for more tests. The mammogram fails to detect certain cancers or the results are misinterpreted. Difficulty with detecting breast cancer in women with dense breasts. What happens before the procedure? Schedule your test about 1-2 weeks after your menstrual period if you are menstruating. This is usually when your breasts are the least tender. If you have had a mammogram done at a different facility in the past, get the mammogram X-rays or have them sent to your current exam facility. The new and old images will be compared. Wash your breasts and underarms on the day of the test. Do not wear deodorants, perfumes, lotions, or powders anywhere on your body on the day of the test. Remove any jewelry from your neck. Wear clothes that you can change into and out of easily. What happens during the procedure?  You will undress from the waist up and put on a gown that opens in the front. You will stand in front of the  X-ray machine. Each breast will be placed between two plastic or glass plates. The plates will compress your breast for a few seconds. Try to stay as relaxed as possible. This procedure does not cause any harm to your breasts. Any discomfort you feel will be very brief. X-rays will be taken from different angles of each breast. The procedure may vary among health care providers and hospitals. What can I expect after the procedure? The mammogram will be examined by a specialist (radiologist). You may need to  repeat certain parts of the test, depending on the quality of the images. This is done if the radiologist needs a better view of the breast tissue. You may resume your normal activities. It is up to you to get the results of your procedure. Ask your health care provider, or the department that is doing the procedure, when your results will be ready. Summary A mammogram is an X-ray of the breasts. This procedure can screen for and detect any changes that may indicate breast cancer. A man may have a mammogram if he has a lump or swelling in his breast tissue. If you have had a mammogram done at a different facility in the past, get the mammogram X-rays or have them sent to your current exam facility in order to compare them. Schedule your test about 1-2 weeks after your menstrual period if you are menstruating. Ask when your test results will be ready. Make sure you get your test results. This information is not intended to replace advice given to you by your health care provider. Make sure you discuss any questions you have with your health care provider. Document Revised: 09/08/2020 Document Reviewed: 10/27/2019 Elsevier Patient Education  [place].

## 2021-06-28 NOTE — Progress Notes (Signed)
Subjective:   Wanda Ruiz is a 86 y.o. female who presents for Medicare Annual (Subsequent) preventive examination.  Review of Systems    No ROS.  Medicare Wellness Virtual Visit.  Visual/audio telehealth visit, UTA vital signs.   See social history for additional risk factors.   Cardiac Risk Factors include: advanced age (>65mn, >>33women);diabetes mellitus;hypertension     Objective:    Today's Vitals   06/28/21 1134  Weight: 135 lb (61.2 kg)  Height: '5\' 3"'$  (1.6 m)   Body mass index is 23.91 kg/m.     06/28/2021   11:39 AM 06/23/2020    3:43 PM 06/23/2019    8:52 AM 02/21/2019    3:30 PM 06/13/2018   10:47 AM 11/09/2017    7:08 AM 06/11/2017    1:24 PM  Advanced Directives  Does Patient Have a Medical Advance Directive? Yes Yes Yes No Yes No No  Type of AParamedicof AWeeping WaterLiving will HPharrLiving will HMount CrawfordLiving will      Does patient want to make changes to medical advance directive? No - Patient declined No - Patient declined No - Patient declined  No - Patient declined    Copy of HGlen Rockin Chart? No - copy requested No - copy requested No - copy requested      Would patient like information on creating a medical advance directive?    No - Patient declined   No - Patient declined    Current Medications (verified) Outpatient Encounter Medications as of 06/28/2021  Medication Sig   ACCU-CHEK AVIVA PLUS test strip USE TO CHECK BLOOD SUGAR TWICE A DAY   ACCU-CHEK SOFTCLIX LANCETS lancets CHECK BLOOD SUGAR ONCE A DAY DX E11.9   aspirin 81 MG tablet Take 81 mg by mouth daily.   Blood Glucose Calibration (ACCU-CHEK GUIDE CONTROL) LIQD Used to check glucometer for accurateness.   calcium-vitamin D (OSCAL WITH D) 250-125 MG-UNIT tablet Take 1 tablet by mouth daily.   ezetimibe (ZETIA) 10 MG tablet TAKE 1 TABLET BY MOUTH EVERY DAY   glipiZIDE (GLUCOTROL XL) 5 MG 24 hr tablet TAKE  1 TABLET DAILY   JARDIANCE 25 MG TABS tablet TAKE 1 TABLET BY MOUTH DAILY BEFORE BREAKFAST.   lisinopril (ZESTRIL) 10 MG tablet TAKE 1 TABLET DAILY   Multiple Vitamin (MULTIVITAMIN) tablet Take 1 tablet by mouth daily.   omeprazole (PRILOSEC) 20 MG capsule TAKE 1 CAPSULE DAILY   No facility-administered encounter medications on file as of 06/28/2021.    Allergies (verified) Patient has no known allergies.   History: Past Medical History:  Diagnosis Date   Anemia    Diabetes mellitus ([place]Port Tobacco Village    GERD (gastroesophageal reflux disease)    Hypercholesterolemia    Hypertension    Hypothyroidism    Osteoporosis    Past Surgical History:  Procedure Laterality Date   COLONOSCOPY     COLONOSCOPY WITH PROPOFOL N/A 11/09/2017   Procedure: COLONOSCOPY WITH PROPOFOL;  Surgeon: SLollie Sails MD;  [facility]  Service: Endoscopy;  Laterality: N/A;   CYST EXCISION     low back   ESOPHAGOGASTRODUODENOSCOPY (EGD) WITH PROPOFOL N/A 11/09/2017   Procedure: ESOPHAGOGASTRODUODENOSCOPY (EGD) WITH PROPOFOL;  Surgeon: SLollie Sails MD;  [facility];  Service: Endoscopy;  Laterality: N/A;   THYROID SURGERY  1992   left hemi-thyroidectomy (colloid goiter0   UPPER GASTROINTESTINAL ENDOSCOPY     Family History  Problem Relation Age of  Onset   Emphysema Father    Heart disease Mother        myocardial infarction (58)   Hypertension Mother    Diabetes Mother    Heart disease Brother        drug use   Diabetes Mellitus II Sister        x3   Hypertension Sister    Breast cancer Other        niece   Breast cancer Sister    Cancer Sister    Colon cancer Neg Hx    Social History   Socioeconomic History   Marital status: Married    Spouse name: Not on file   Number of children: 2   Years of education: Not on file   Highest education level: Not on file  Occupational History   Not on file  Tobacco Use   Smoking status: Never   Smokeless tobacco: Never   Vaping Use   Vaping Use: Never used  Substance and Sexual Activity   Alcohol use: No    Alcohol/week: 0.0 standard drinks of alcohol    Comment: occasional   Drug use: Never   Sexual activity: Not on file  Other Topics Concern   Not on file  Social History Narrative   In [place]; with husband; never smoked;  no alcohol; telephone receptionist.    Social Determinants of Health   Financial Resource Strain: Low Risk  (06/28/2021)   Overall Financial Resource Strain (CARDIA)    Difficulty of Paying Living Expenses: Not hard at all  Food Insecurity: No Food Insecurity (06/28/2021)   Hunger Vital Sign    Worried About Running Out of Food in the Last Year: Never true    [place] in the Last Year: Never true  Transportation Needs: No Transportation Needs (06/28/2021)   PRAPARE - Hydrologist (Medical): No    Lack of Transportation (Non-Medical): No  Physical Activity: Unknown (06/13/2018)   Exercise Vital Sign    Days of Exercise per Week: 0 days    Minutes of Exercise per Session: Not on file  Stress: No Stress Concern Present (06/28/2021)   [place]    Feeling of Stress : Not at all  Social Connections: Unknown (06/28/2021)   Social Connection and Isolation Panel [NHANES]    Frequency of Communication with Friends and Family: More than three times a week    Frequency of Social Gatherings with Friends and Family: Not on file    Attends Religious Services: Not on file    Active Member of Clubs or Organizations: Not on file    Attends Archivist Meetings: Not on file    Marital Status: Married    Tobacco Counseling Counseling given: Not Answered   Clinical Intake:  Pre-visit preparation completed: Yes        Diabetes: Yes (Followed by PCP)  How often do you need to have someone help you when you read instructions, pamphlets, or other written materials from  your doctor or pharmacy?: 1 - Never  Interpreter Needed?: No    Activities of Daily Living    06/28/2021   11:40 AM  In your present state of health, do you have any difficulty performing the following activities:  Hearing? 0  Vision? 0  Difficulty concentrating or making decisions? 0  Walking or climbing stairs? 0  Dressing or bathing? 0  Doing errands, shopping? 0  Preparing Food  and eating ? N  Using the Toilet? N  In the past six months, have you accidently leaked urine? N  Do you have problems with loss of bowel control? N  Managing your Medications? N  Managing your Finances? N  Housekeeping or managing your Housekeeping? N   Patient Care Team: Einar Pheasant, MD as PCP - General (Internal Medicine)  Indicate any recent Medical Services you may have received from other than [facility] providers in the past year (date may be approximate).     Assessment:   This is a routine wellness examination for Wanda Ruiz.  Virtual Visit via Telephone Note  I connected with  Wanda Ruiz on 06/28/21 at 11:30 AM EDT by telephone and verified that I am speaking with the correct person using two identifiers.  Persons participating in the virtual visit: patient/Nurse Health Advisor   I discussed the limitations of performing an evaluation and management service by telehealth. We continued and completed visit with audio only. Some vital signs may be absent or patient reported.   Hearing/Vision screen Hearing Screening - Comments:: Patient is able to hear conversational tones without difficulty. No issues reported. Vision Screening - Comments:: Followed by [facility] Wears reading glasses No retinopathy reported Glaucoma; dry eye drops They have regular follow up with the ophthalmologist  Dietary issues and exercise activities discussed: Current Exercise Habits: Home exercise routine Healthy diet     Goals Addressed               This Visit's Progress     Patient  Stated     Maintain Healthy Lifestyle (pt-stated)        Stay active Healthy diet       Depression Screen    06/28/2021   11:38 AM 06/21/2021   10:09 AM 12/23/2020   11:50 AM 06/23/2020    3:41 PM 06/23/2019    8:48 AM 06/13/2018   10:47 AM 08/10/2017    9:01 AM  PHQ 2/9 Scores  PHQ - 2 Score 0 0 0 0 0 1 0    Fall Risk    06/28/2021   11:39 AM 06/21/2021   10:06 AM 06/21/2021    9:54 AM 12/23/2020   11:50 AM 06/23/2020    3:56 PM  Fall Risk   Falls in the past year? 0 0 0 0 0  Number falls in past yr: 0 0 0 0 0  Injury with Fall?  0 0 0 0  Risk for fall due to :  No Fall Risks No Fall Risks    Follow up Falls evaluation completed Falls evaluation completed Falls evaluation completed Falls evaluation completed Falls evaluation completed    [place]: Home free of loose throw rugs in walkways, pet beds, electrical cords, etc? Yes  Adequate lighting in your home to reduce risk of falls? Yes   ASSISTIVE DEVICES UTILIZED TO PREVENT FALLS: Life alert? No  Use of a cane, walker or w/c? No   TIMED UP AND GO: Was the test performed? No .   Cognitive Function: Patient is alert and oriented x3.  Enjoys reading for brain health stimulation.      06/09/2016    1:49 PM  MMSE - Mini Mental State Exam  Orientation to time 5  Orientation to Place 5  Registration 3  Attention/ Calculation 5  Recall 3  Language- name 2 objects 2  Language- repeat 1  Language- follow 3 step command 3  Language- read &  follow direction 1  Write a sentence 1  Copy design 1  Total score 30        06/23/2019    8:52 AM 06/13/2018   10:48 AM 06/11/2017    1:47 PM  6CIT Screen  What Year? 0 points 0 points 0 points  What month? 0 points 0 points 0 points  What time?  0 points 0 points  Count back from 20  0 points 0 points  Months in reverse 0 points 0 points 0 points  Repeat phrase  0 points 0 points  Total Score  0 points 0 points     Immunizations Immunization History  Administered Date(s) Administered   Fluad Quad(high Dose 65+) 09/10/2018, 10/09/2019, 10/13/2020   Influenza, High Dose Seasonal PF 10/22/2015, 09/19/2016, 11/22/2017   Influenza,inj,Quad PF,6+ Mos 10/13/2013, 10/15/2014   PFIZER Comirnaty(Gray Top)Covid-19 Tri-Sucrose Vaccine 04/20/2020   PFIZER(Purple Top)SARS-COV-2 Vaccination 02/07/2019, 02/28/2019, 10/04/2019   Pfizer Covid-19 Vaccine Bivalent Booster 22yr & up 10/16/2020   Pneumococcal Conjugate-13 02/13/2014   Pneumococcal Polysaccharide-23 01/25/2017   Zoster Recombinat (Shingrix) 10/23/2019, 12/22/2019   Screening Tests Health Maintenance  Topic Date Due   COVID-19 Vaccine (6 - Pfizer series) 07/14/2021 (Originally 02/16/2021)   TETANUS/TDAP  12/23/2021 (Originally 04/06/1954)   MAMMOGRAM  07/14/2021   INFLUENZA VACCINE  08/09/2021   HEMOGLOBIN A1C  09/22/2021   FOOT EXAM  12/23/2021   OPHTHALMOLOGY EXAM  03/11/2022   Pneumonia Vaccine 86 Years old  Completed   DEXA SCAN  Completed   Zoster Vaccines- Shingrix  Completed   HPV VACCINES  Aged Out   Health Maintenance There are no preventive care reminders to display for this patient.  Mammogram due 07/14/21. Ordered. Call [phone] scheduling.   Lung Cancer Screening: (Low Dose CT Chest recommended if Age 84-80years, 30 pack-year currently smoking OR have quit w/in 15years.) does not qualify.   Hepatitis C Screening: does not qualify.  Vision Screening: Recommended annual ophthalmology exams for early detection of glaucoma and other disorders of the eye.  Dental Screening: Recommended annual dental exams for proper oral hygiene  Community Resource Referral / Chronic Care Management: CRR required this visit?  No   CCM required this visit?  No      Plan:   Keep all routine maintenance appointments.   I have personally reviewed and noted the following in the patient's chart:   Medical and social history Use of  alcohol, tobacco or illicit drugs  Current medications and supplements including opioid prescriptions.  Functional ability and status Nutritional status Physical activity Advanced directives List of other physicians Hospitalizations, surgeries, and ER visits in previous 12 months Vitals Screenings to include cognitive, depression, and falls Referrals and appointments  In addition, I have reviewed and discussed with patient certain preventive protocols, quality metrics, and best practice recommendations. A written personalized care plan for preventive services as well as general preventive health recommendations were provided to patient.     OVarney Biles LPN   [id number]

## 2021-06-30 ENCOUNTER — Other Ambulatory Visit: Payer: Self-pay | Admitting: Family

## 2021-07-08 ENCOUNTER — Other Ambulatory Visit (INDEPENDENT_AMBULATORY_CARE_PROVIDER_SITE_OTHER): Payer: Medicare Other

## 2021-07-08 DIAGNOSIS — E7800 Pure hypercholesterolemia, unspecified: Secondary | ICD-10-CM

## 2021-07-08 DIAGNOSIS — D649 Anemia, unspecified: Secondary | ICD-10-CM

## 2021-07-08 DIAGNOSIS — E1165 Type 2 diabetes mellitus with hyperglycemia: Secondary | ICD-10-CM | POA: Diagnosis not present

## 2021-07-08 LAB — LIPID PANEL
Cholesterol: 177 mg/dL (ref 0–200)
HDL: 51.8 mg/dL (ref >39.00)
LDL Cholesterol: 111 mg/dL — ABNORMAL HIGH (ref 0–99)
NonHDL: 125.02
Total CHOL/HDL Ratio: 3
Triglycerides: 69 mg/dL (ref 0.0–149.0)
VLDL: 13.8 mg/dL (ref 0.0–40.0)

## 2021-07-08 LAB — BASIC METABOLIC PANEL
BUN: 17 mg/dL (ref 6–23)
CO2: 26 mEq/L (ref 19–32)
Calcium: 9.3 mg/dL (ref 8.4–10.5)
Chloride: 106 mEq/L (ref 96–112)
Creatinine, Ser: 0.9 mg/dL (ref 0.40–1.20)
GFR: 58.81 mL/min — ABNORMAL LOW (ref >60.00)
Glucose, Bld: 90 mg/dL (ref 70–99)
Potassium: 4.2 mEq/L (ref 3.5–5.1)
Sodium: 140 mEq/L (ref 135–145)

## 2021-07-08 LAB — HEPATIC FUNCTION PANEL
ALT: 13 U/L (ref 0–35)
AST: 17 U/L (ref 0–37)
Albumin: 4.2 g/dL (ref 3.5–5.2)
Alkaline Phosphatase: 48 U/L (ref 39–117)
Bilirubin, Direct: 0.1 mg/dL (ref 0.0–0.3)
Total Bilirubin: 0.5 mg/dL (ref 0.2–1.2)
Total Protein: 7.3 g/dL (ref 6.0–8.3)

## 2021-07-08 LAB — CBC WITH DIFFERENTIAL/PLATELET
Basophils Absolute: 0.1 10*3/uL (ref 0.0–0.1)
Basophils Relative: 1.4 % (ref 0.0–3.0)
Eosinophils Absolute: 0.2 10*3/uL (ref 0.0–0.7)
Eosinophils Relative: 2.2 % (ref 0.0–5.0)
HCT: 36.2 % (ref 36.0–46.0)
Hemoglobin: 11.4 g/dL — ABNORMAL LOW (ref 12.0–15.0)
Lymphocytes Relative: 22.1 % (ref 12.0–46.0)
Lymphs Abs: 1.6 10*3/uL (ref 0.7–4.0)
MCHC: 31.4 g/dL (ref 30.0–36.0)
MCV: 78.9 fl (ref 78.0–100.0)
Monocytes Absolute: 0.6 10*3/uL (ref 0.1–1.0)
Monocytes Relative: 8.2 % (ref 3.0–12.0)
Neutro Abs: 4.9 10*3/uL (ref 1.4–7.7)
Neutrophils Relative %: 66.1 % (ref 43.0–77.0)
Platelets: 315 10*3/uL (ref 150.0–400.0)
RBC: 4.59 Mil/uL (ref 3.87–5.11)
RDW: 18.8 % — ABNORMAL HIGH (ref 11.5–15.5)
WBC: 7.4 10*3/uL (ref 4.0–10.5)

## 2021-07-08 LAB — MICROALBUMIN / CREATININE URINE RATIO
Creatinine,U: 67.9 mg/dL
Microalb Creat Ratio: 5.4 mg/g (ref 0.0–30.0)
Microalb, Ur: 3.7 mg/dL — ABNORMAL HIGH (ref 0.0–1.9)

## 2021-07-08 LAB — HEMOGLOBIN A1C: Hgb A1c MFr Bld: 6.8 % — ABNORMAL HIGH (ref 4.6–6.5)

## 2021-07-13 ENCOUNTER — Ambulatory Visit: Payer: Medicare Other | Admitting: Internal Medicine

## 2021-07-13 ENCOUNTER — Encounter: Payer: Self-pay | Admitting: Internal Medicine

## 2021-07-13 DIAGNOSIS — I700 Atherosclerosis of aorta: Secondary | ICD-10-CM | POA: Diagnosis not present

## 2021-07-13 DIAGNOSIS — E039 Hypothyroidism, unspecified: Secondary | ICD-10-CM

## 2021-07-13 DIAGNOSIS — E1165 Type 2 diabetes mellitus with hyperglycemia: Secondary | ICD-10-CM | POA: Diagnosis not present

## 2021-07-13 DIAGNOSIS — T466X5A Adverse effect of antihyperlipidemic and antiarteriosclerotic drugs, initial encounter: Secondary | ICD-10-CM

## 2021-07-13 DIAGNOSIS — E7800 Pure hypercholesterolemia, unspecified: Secondary | ICD-10-CM

## 2021-07-13 DIAGNOSIS — I10 Essential (primary) hypertension: Secondary | ICD-10-CM | POA: Diagnosis not present

## 2021-07-13 DIAGNOSIS — F439 Reaction to severe stress, unspecified: Secondary | ICD-10-CM

## 2021-07-13 DIAGNOSIS — D649 Anemia, unspecified: Secondary | ICD-10-CM

## 2021-07-13 DIAGNOSIS — G720 Drug-induced myopathy: Secondary | ICD-10-CM

## 2021-07-13 DIAGNOSIS — M79671 Pain in right foot: Secondary | ICD-10-CM

## 2021-07-13 NOTE — Progress Notes (Signed)
[name], female   DOB: [birth year], 86 y.o.   MRN: [id number]   Subjective:    [name], female    DOB: [birth year], 86 y.o.   MRN: [id number]   Patient here for a scheduled follow up.   Chief Complaint  Patient presents with   Follow-up    10 week follow up   .   HPI Recently evaluated 6/13 - urgent care.  Diagnosed with respiratory infection and placed on omnicef - covered urine infection as well.  No chest pain.  Breathing stable.  No cough or congestion.  No acid reflux.  No abdominal pain.  Bowels moving.  Persistent right foot pain.  Request referral to podiatry.  Tolerating jardiance.     Past Medical History:  Diagnosis Date   Anemia    Diabetes mellitus ([place])    GERD (gastroesophageal reflux disease)    Hypercholesterolemia    Hypertension    Hypothyroidism    Osteoporosis    Past Surgical History:  Procedure Laterality Date   COLONOSCOPY     COLONOSCOPY WITH PROPOFOL N/A 11/09/2017   Procedure: COLONOSCOPY WITH PROPOFOL;  Surgeon: Lollie Sails, MD;  Location: [facility] ENDOSCOPY;  Service: Endoscopy;  Laterality: N/A;   CYST EXCISION     low back   ESOPHAGOGASTRODUODENOSCOPY (EGD) WITH PROPOFOL N/A 11/09/2017   Procedure: ESOPHAGOGASTRODUODENOSCOPY (EGD) WITH PROPOFOL;  Surgeon: Lollie Sails, MD;  Location: [facility] ENDOSCOPY;  Service: Endoscopy;  Laterality: N/A;   THYROID SURGERY  1992   left hemi-thyroidectomy (colloid goiter0   UPPER GASTROINTESTINAL ENDOSCOPY     Family History  Problem Relation Age of Onset   Emphysema Father    Heart disease Mother        myocardial infarction (30)   Hypertension Mother    Diabetes Mother    Heart disease Brother        drug use   Diabetes Mellitus II Sister        x3   Hypertension Sister    Breast cancer Other        niece   Breast cancer Sister    Cancer Sister    Colon cancer Neg Hx    Social History   Socioeconomic History   Marital status: Married    Spouse  name: Not on file   Number of children: 2   Years of education: Not on file   Highest education level: Not on file  Occupational History   Not on file  Tobacco Use   Smoking status: Never   Smokeless tobacco: Never  Vaping Use   Vaping Use: Never used  Substance and Sexual Activity   Alcohol use: No    Alcohol/week: 0.0 standard drinks of alcohol    Comment: occasional   Drug use: Never   Sexual activity: Not on file  Other Topics Concern   Not on file  Social History Narrative   In [place]; with husband; never smoked;  no alcohol; telephone receptionist.    Social Determinants of Health   Financial Resource Strain: Low Risk  (06/28/2021)   Overall Financial Resource Strain (CARDIA)    Difficulty of Paying Living Expenses: Not hard at all  Food Insecurity: No Food Insecurity (06/28/2021)   Hunger Vital Sign    Worried About Running Out of Food in the Last Year: Never true    [place] in the Last Year: Never true  Transportation Needs: No Transportation Needs (06/28/2021)   [place] - Transportation  Lack of Transportation (Medical): No    Lack of Transportation (Non-Medical): No  Physical Activity: Unknown (06/13/2018)   Exercise Vital Sign    Days of Exercise per Week: 0 days    Minutes of Exercise per Session: Not on file  Stress: No Stress Concern Present (06/28/2021)   [place]    Feeling of Stress : Not at all  Social Connections: Unknown (06/28/2021)   Social Connection and Isolation Panel [NHANES]    Frequency of Communication with Friends and Family: More than three times a week    Frequency of Social Gatherings with Friends and Family: Not on file    Attends Religious Services: Not on file    Active Member of Clubs or Organizations: Not on file    Attends Archivist Meetings: Not on file    Marital Status: Married     Review of Systems  Constitutional:  Negative for  appetite change and unexpected weight change.  HENT:  Negative for congestion and sinus pressure.   Respiratory:  Negative for cough, chest tightness and shortness of breath.   Cardiovascular:  Negative for chest pain, palpitations and leg swelling.  Gastrointestinal:  Negative for abdominal pain, diarrhea, nausea and vomiting.  Genitourinary:  Negative for difficulty urinating and dysuria.  Musculoskeletal:  Negative for joint swelling and myalgias.  Skin:  Negative for color change and rash.  Neurological:  Negative for dizziness, light-headedness and headaches.  Psychiatric/Behavioral:  Negative for agitation and dysphoric mood.        Objective:     BP 110/64 (BP Location: Left Arm, Patient Position: Sitting, Cuff Size: Normal)   Pulse 60   Temp 97.7 F (36.5 C) (Oral)   Resp 20   Ht 5' 3" (1.6 m)   Wt 130 lb 12.8 oz (59.3 kg)   SpO2 97%   BMI 23.17 kg/m  Wt Readings from Last 3 Encounters:  07/13/21 130 lb 12.8 oz (59.3 kg)  06/28/21 135 lb (61.2 kg)  06/21/21 135 lb 6.4 oz (61.4 kg)    Physical Exam Vitals reviewed.  Constitutional:      General: She is not in acute distress.    Appearance: Normal appearance.  HENT:     Head: Normocephalic and atraumatic.     Right Ear: External ear normal.     Left Ear: External ear normal.  Eyes:     General: No scleral icterus.       Right eye: No discharge.        Left eye: No discharge.     Conjunctiva/sclera: Conjunctivae normal.  Neck:     Thyroid: No thyromegaly.  Cardiovascular:     Rate and Rhythm: Normal rate and regular rhythm.  Pulmonary:     Effort: No respiratory distress.     Breath sounds: Normal breath sounds. No wheezing.  Abdominal:     General: Bowel sounds are normal.     Palpations: Abdomen is soft.     Tenderness: There is no abdominal tenderness.  Musculoskeletal:        General: No swelling or tenderness.     Cervical back: Neck supple. No tenderness.  Lymphadenopathy:     Cervical: No  cervical adenopathy.  Skin:    Findings: No erythema or rash.  Neurological:     Mental Status: She is alert.  Psychiatric:        Mood and Affect: Mood normal.        Behavior:  Behavior normal.      Outpatient Encounter Medications as of 07/13/2021  Medication Sig   ACCU-CHEK AVIVA PLUS test strip USE TO CHECK BLOOD SUGAR TWICE A DAY   ACCU-CHEK SOFTCLIX LANCETS lancets CHECK BLOOD SUGAR ONCE A DAY DX E11.9   aspirin 81 MG tablet Take 81 mg by mouth daily.   Blood Glucose Calibration (ACCU-CHEK GUIDE CONTROL) LIQD Used to check glucometer for accurateness.   calcium-vitamin D (OSCAL WITH D) 250-125 MG-UNIT tablet Take 1 tablet by mouth daily.   ezetimibe (ZETIA) 10 MG tablet TAKE 1 TABLET BY MOUTH EVERY DAY   glipiZIDE (GLUCOTROL XL) 5 MG 24 hr tablet TAKE 1 TABLET DAILY   JARDIANCE 25 MG TABS tablet TAKE 1 TABLET BY MOUTH DAILY BEFORE BREAKFAST.   lisinopril (ZESTRIL) 10 MG tablet TAKE 1 TABLET DAILY   Multiple Vitamin (MULTIVITAMIN) tablet Take 1 tablet by mouth daily.   omeprazole (PRILOSEC) 20 MG capsule TAKE 1 CAPSULE DAILY   No facility-administered encounter medications on file as of 07/13/2021.     Lab Results  Component Value Date   WBC 7.4 07/08/2021   HGB 11.4 (L) 07/08/2021   HCT 36.2 07/08/2021   PLT 315.0 07/08/2021   GLUCOSE 90 07/08/2021   CHOL 177 07/08/2021   TRIG 69.0 07/08/2021   HDL 51.80 07/08/2021   LDLCALC 111 (H) 07/08/2021   ALT 13 07/08/2021   AST 17 07/08/2021   NA 140 07/08/2021   K 4.2 07/08/2021   CL 106 07/08/2021   CREATININE 0.90 07/08/2021   BUN 17 07/08/2021   CO2 26 07/08/2021   TSH 0.64 03/22/2021   HGBA1C 6.8 (H) 07/08/2021   MICROALBUR 3.7 (H) 07/08/2021    DG Bone Density  Result Date: 02/14/2021 EXAM: DUAL X-RAY ABSORPTIOMETRY (DXA) FOR BONE MINERAL DENSITY IMPRESSION: Dear Einar Pheasant, Your patient Dawnette Mione completed a FRAX assessment on 02/14/2021 using the [place] (analysis version: 14.10)  manufactured by [employer]. The following summarizes the results of our evaluation. PATIENT BIOGRAPHICAL: Name: Rielyn, Krupinski Patient ID: [id number] Birth Date: February 13, 1935 Height:    62.0 in. Gender:     [age]    [age]       Weight:    136.0 lbs. Ethnicity:  Black                            Exam Date: 02/14/2021 FRAX* RESULTS:  (version: 3.5) 10-year Probability of Fracture1 Major Osteoporotic Fracture2 Hip Fracture 6.1% 1.5% Population: [place] (Black) Risk Factors: None Based on Femur (Right) Neck BMD 1 -The 10-year probability of fracture may be lower than reported if the patient has received treatment. 2 -Major Osteoporotic Fracture: Clinical Spine, Forearm, Hip or Shoulder *FRAX is a Materials engineer of the [employer] for Metabolic Bone Disease, a Commerce (WHO) [facility]. ASSESSMENT: The probability of a major osteoporotic fracture is 6.1% within the next ten years. The probability of a hip fracture is 1.5% within the next ten years. . Your patient Genine Beckett completed a BMD test on 02/14/2021 using the [place] (software version: 14.10) manufactured by [employer]. The following summarizes the results of our evaluation. Technologist: PATIENT BIOGRAPHICAL: Name: Cici, Rodriges Patient ID: [id number] Birth Date: 1935-12-31 Height: 62.0 in. Gender: Female Exam Date: 02/14/2021 Weight: 136.0 lbs. Indications: Postmenopausal, Advanced Age, DIABETES Fractures: Treatments: ASPRIN 81 MG, CALCIUM VIT  D, Glipizide, Jardiance, Multi-Vitamin with calcium, omeprazole DENSITOMETRY RESULTS: Site      Region     Measured Date Measured Age WHO Classification Young Adult T-score BMD         %Change vs. Previous Significant Change (*) AP Spine L1-L4 (L3) 4/14/2023 83.8 Normal -0.2 1.156 g/cm2 2.8% Yes AP Spine L1-L4 (L3) 11/03/2014 77.5 Normal -0.5 1.125 g/cm2 - - DualFemur Neck Right 02/14/2023 83.8 Osteopenia -1.4 0.849  g/cm2 -2.9% - DualFemur Neck Right 11/03/2014 77.5 Osteopenia -1.2 0.874 g/cm2 - - DualFemur Total Mean 02/14/2021 83.8 Normal -0.3 0.969 g/cm2 -0.1% - DualFemur Total Mean 11/03/2014 77.5 Normal -0.3 0.970 g/cm2 - - ASSESSMENT: The BMD measured at Femur Neck Right is 0.849 g/cm2 with a T-score of -1.4. This patient is considered osteopenic according to [facility]) criteria. The scan quality is good. Compared with prior study, there has been a significant increase in the spine. Compared with prior study, there has been no significant change in the total hip. L-3 was excluded due to degenerative changes. World Pharmacologist [facility]) criteria for post-menopausal, Caucasian Women: Normal:                   T-score at or above -1 SD Osteopenia/low bone mass: T-score between -1 and -2.5 SD Osteoporosis:             T-score at or below -2.5 SD RECOMMENDATIONS: 1. All patients should optimize calcium and vitamin D intake. 2. Consider FDA-approved medical therapies in postmenopausal women and men aged 47 years and older, based on the following: a. A hip or vertebral(clinical or morphometric) fracture b. T-score < -2.5 at the femoral neck or spine after appropriate evaluation to exclude secondary causes c. Low bone mass (T-score between -1.0 and -2.5 at the femoral neck or spine) and a 10-year probability of a hip fracture > 3% or a 10-year probability of a major osteoporosis-related fracture > 20% based on the [place]-adapted WHO algorithm 3. Clinician judgment and/or patient preferences may indicate treatment for people with 10-year fracture probabilities above or below these levels FOLLOW-UP: People with diagnosed cases of osteoporosis or at high risk for fracture should have regular bone mineral density tests. For patients eligible for Medicare, routine testing is allowed once every 2 years. The testing frequency can be increased to one year for patients who have rapidly progressing disease, those who  are receiving or discontinuing medical therapy to restore bone mass, or have additional risk factors. I have reviewed this report, and agree with the above findings. [facility] Electronically Signed   By: Elmer Picker M.D.   On: 02/14/2021 12:02       Assessment & Plan:   Problem List Items Addressed This Visit     Anemia    Follow cbc.       Relevant Orders   CBC with Differential/Platelet   Ferritin   Vitamin B12   Aortic atherosclerosis ([place])    Intolerant to statins.  Continue Zetia. Low cholesterol diet and exercise.  Follow lipid panel.   Lab Results  Component Value Date   CHOL 177 07/08/2021   HDL 51.80 07/08/2021   LDLCALC 111 (H) 07/08/2021   TRIG 69.0 07/08/2021   CHOLHDL 3 07/08/2021       Foot pain, right    Right foot pain.  Request referral to podiatry for further evaluation.       Relevant Orders   Ambulatory referral to Podiatry   Hypercholesterolemia  Intolerant to statin medication.  On zetia.  Low cholesterol diet and exercise.  Follow lipid panel.       Relevant Orders   Hepatic function panel   Lipid panel   Hypertension    Continue lisinopril.  Blood pressure as outlined.   On jardiance 20m q day. Stay hydrated.   Follow pressures.  Follow metabolic panel.       Relevant Orders   Basic metabolic panel   Hypothyroidism    Previously evaluated by Dr SGabriel Carina  Recommended f/u in 12 months.  Follow tsh.       Statin myopathy    Intolerant to statin medication.  On zetia.       Stress    Overall appears to be handling things well. Does not feel needs any further intervention.  Follow.       Type 2 diabetes mellitus with hyperglycemia ([facility])    Continue jardiance and glipizide.  Tolerating and feels better.  Low carb diet and exercise.  Follow met b and a1c. Recent check 6.8.       Relevant Orders   Hemoglobin A1c     CEinar Pheasant MD

## 2021-07-17 ENCOUNTER — Encounter: Payer: Self-pay | Admitting: Internal Medicine

## 2021-07-17 DIAGNOSIS — M79671 Pain in right foot: Secondary | ICD-10-CM | POA: Insufficient documentation

## 2021-07-17 NOTE — Assessment & Plan Note (Signed)
Continue lisinopril.  Blood pressure as outlined.   On jardiance 25mg q day. Stay hydrated.   Follow pressures.  Follow metabolic panel.  

## 2021-07-17 NOTE — Assessment & Plan Note (Signed)
Overall appears to be handling things well. Does not feel needs any further intervention.  Follow.

## 2021-07-17 NOTE — Assessment & Plan Note (Signed)
Follow cbc.  

## 2021-07-17 NOTE — Assessment & Plan Note (Signed)
Previously evaluated by Dr Gabriel Carina.  Recommended f/u in 12 months.  Follow tsh.

## 2021-07-17 NOTE — Assessment & Plan Note (Signed)
Right foot pain.  Request referral to podiatry for further evaluation.

## 2021-07-17 NOTE — Assessment & Plan Note (Signed)
Intolerant to statin medication.  On zetia. 

## 2021-07-17 NOTE — Assessment & Plan Note (Signed)
Continue jardiance and glipizide.  Tolerating and feels better.  Low carb diet and exercise.  Follow met b and a1c. Recent check 6.8.

## 2021-07-17 NOTE — Assessment & Plan Note (Signed)
Intolerant to statin medication. On zetia.  Low cholesterol diet and exercise.  Follow lipid panel.  

## 2021-07-17 NOTE — Assessment & Plan Note (Signed)
Intolerant to statins.  Continue Zetia. Low cholesterol diet and exercise.  Follow lipid panel.   Lab Results  Component Value Date   CHOL 177 07/08/2021   HDL 51.80 07/08/2021   LDLCALC 111 (H) 07/08/2021   TRIG 69.0 07/08/2021   CHOLHDL 3 07/08/2021

## 2021-07-25 ENCOUNTER — Other Ambulatory Visit: Payer: Self-pay | Admitting: Internal Medicine

## 2021-07-25 DIAGNOSIS — Z1231 Encounter for screening mammogram for malignant neoplasm of breast: Secondary | ICD-10-CM

## 2021-08-01 ENCOUNTER — Encounter: Payer: Self-pay | Admitting: Podiatry

## 2021-08-01 ENCOUNTER — Ambulatory Visit: Payer: Medicare Other | Admitting: Podiatry

## 2021-08-01 ENCOUNTER — Ambulatory Visit (INDEPENDENT_AMBULATORY_CARE_PROVIDER_SITE_OTHER): Payer: Medicare Other

## 2021-08-01 DIAGNOSIS — M5417 Radiculopathy, lumbosacral region: Secondary | ICD-10-CM

## 2021-08-01 DIAGNOSIS — M779 Enthesopathy, unspecified: Secondary | ICD-10-CM | POA: Diagnosis not present

## 2021-08-01 NOTE — Progress Notes (Signed)
  Subjective:  [name], female    DOB: 1935-06-20,  MRN: [id number]  Chief Complaint  Patient presents with   Foot Pain    "Some time I get pain in that big toe and I have numbness.  I want to make sure I don't have a blood clot in my leg." N - pain in big toe L - hallux right D - 3 years O - gradually C - numbness, tender, Diabetic A - none T - foot massager, massage w/lotion, pain cream    86 y.o. female presents with the above complaint. History confirmed with patient.  She has a history of low back pain.  Her diabetes is always well controlled  Objective:  Physical Exam: warm, good capillary refill, no trophic changes or ulcerative lesions, normal DP and PT pulses, normal monofilament exam, and normal sensory exam.   Radiographs: Multiple views x-ray of the left foot: degenerative changes of the first metatarsal phalangeal joint, subchondral sclerosis of the joint, and hallux valgus deformity Assessment:  We will 1. Right lumbosacral radiculopathy      Plan:  Patient was evaluated and treated and all questions answered.  I discussed my clinical and radiographic findings with her.  I discussed with her that I do not see any evidence that the symptoms she is experiencing are directly related to any pathology happening within her foot itself.  She does have a history of lumbosacral radiculopathy and sciatica and I think this is probably the primary cause of this.  Her diabetes is well controlled, diabetic polyneuropathy is not completely out of the realm of possibility but she has a normal monofilament exam.  She does have osteoarthritis and hallux valgus deformity which is not particularly painful for her.  She may follow-up with me as needed I discussed with her if it worsens or does not improve she may discuss this with her PCP for referral to a back specialist  Return if symptoms worsen or fail to improve.

## 2021-08-16 ENCOUNTER — Ambulatory Visit
Admission: RE | Admit: 2021-08-16 | Discharge: 2021-08-16 | Disposition: A | Discharge status: Home / Self Care | Payer: Medicare Other | Source: Ambulatory Visit | Attending: Internal Medicine | Admitting: Internal Medicine

## 2021-08-16 DIAGNOSIS — Z1231 Encounter for screening mammogram for malignant neoplasm of breast: Secondary | ICD-10-CM | POA: Insufficient documentation

## 2021-09-07 ENCOUNTER — Other Ambulatory Visit: Payer: Self-pay | Admitting: Internal Medicine

## 2021-09-11 ENCOUNTER — Other Ambulatory Visit: Payer: Self-pay | Admitting: Internal Medicine

## 2021-10-10 ENCOUNTER — Other Ambulatory Visit (INDEPENDENT_AMBULATORY_CARE_PROVIDER_SITE_OTHER): Payer: Medicare Other

## 2021-10-10 DIAGNOSIS — I10 Essential (primary) hypertension: Secondary | ICD-10-CM | POA: Diagnosis not present

## 2021-10-10 DIAGNOSIS — E7800 Pure hypercholesterolemia, unspecified: Secondary | ICD-10-CM | POA: Diagnosis not present

## 2021-10-10 DIAGNOSIS — D649 Anemia, unspecified: Secondary | ICD-10-CM | POA: Diagnosis not present

## 2021-10-10 DIAGNOSIS — E1165 Type 2 diabetes mellitus with hyperglycemia: Secondary | ICD-10-CM

## 2021-10-10 LAB — HEPATIC FUNCTION PANEL
ALT: 11 U/L (ref 0–35)
AST: 16 U/L (ref 0–37)
Albumin: 4.1 g/dL (ref 3.5–5.2)
Alkaline Phosphatase: 48 U/L (ref 39–117)
Bilirubin, Direct: 0.1 mg/dL (ref 0.0–0.3)
Total Bilirubin: 0.5 mg/dL (ref 0.2–1.2)
Total Protein: 7.3 g/dL (ref 6.0–8.3)

## 2021-10-10 LAB — CBC WITH DIFFERENTIAL/PLATELET
Basophils Absolute: 0.1 10*3/uL (ref 0.0–0.1)
Basophils Relative: 1.2 % (ref 0.0–3.0)
Eosinophils Absolute: 0.2 10*3/uL (ref 0.0–0.7)
Eosinophils Relative: 2.9 % (ref 0.0–5.0)
HCT: 36.9 % (ref 36.0–46.0)
Hemoglobin: 11.8 g/dL — ABNORMAL LOW (ref 12.0–15.0)
Lymphocytes Relative: 26.5 % (ref 12.0–46.0)
Lymphs Abs: 1.6 10*3/uL (ref 0.7–4.0)
MCHC: 32 g/dL (ref 30.0–36.0)
MCV: 77 fl — ABNORMAL LOW (ref 78.0–100.0)
Monocytes Absolute: 0.6 10*3/uL (ref 0.1–1.0)
Monocytes Relative: 9 % (ref 3.0–12.0)
Neutro Abs: 3.8 10*3/uL (ref 1.4–7.7)
Neutrophils Relative %: 60.4 % (ref 43.0–77.0)
Platelets: 240 10*3/uL (ref 150.0–400.0)
RBC: 4.79 Mil/uL (ref 3.87–5.11)
RDW: 18.4 % — ABNORMAL HIGH (ref 11.5–15.5)
WBC: 6.2 10*3/uL (ref 4.0–10.5)

## 2021-10-10 LAB — FERRITIN: Ferritin: 72.3 ng/mL (ref 10.0–291.0)

## 2021-10-10 LAB — BASIC METABOLIC PANEL
BUN: 23 mg/dL (ref 6–23)
CO2: 24 mEq/L (ref 19–32)
Calcium: 9.5 mg/dL (ref 8.4–10.5)
Chloride: 105 mEq/L (ref 96–112)
Creatinine, Ser: 0.94 mg/dL (ref 0.40–1.20)
GFR: 55.72 mL/min — ABNORMAL LOW (ref >60.00)
Glucose, Bld: 106 mg/dL — ABNORMAL HIGH (ref 70–99)
Potassium: 4.4 mEq/L (ref 3.5–5.1)
Sodium: 137 mEq/L (ref 135–145)

## 2021-10-10 LAB — HEMOGLOBIN A1C: Hgb A1c MFr Bld: 6.8 % — ABNORMAL HIGH (ref 4.6–6.5)

## 2021-10-10 LAB — LIPID PANEL
Cholesterol: 179 mg/dL (ref 0–200)
HDL: 51.9 mg/dL (ref >39.00)
LDL Cholesterol: 114 mg/dL — ABNORMAL HIGH (ref 0–99)
NonHDL: 127.34
Total CHOL/HDL Ratio: 3
Triglycerides: 68 mg/dL (ref 0.0–149.0)
VLDL: 13.6 mg/dL (ref 0.0–40.0)

## 2021-10-10 LAB — VITAMIN B12: Vitamin B-12: 874 pg/mL (ref 211–911)

## 2021-10-13 ENCOUNTER — Ambulatory Visit: Payer: Medicare Other | Admitting: Internal Medicine

## 2021-10-17 ENCOUNTER — Ambulatory Visit: Payer: Medicare Other | Admitting: Internal Medicine

## 2021-10-17 ENCOUNTER — Encounter: Payer: Self-pay | Admitting: Internal Medicine

## 2021-10-17 VITALS — BP 118/62 | HR 67 | Temp 98.5°F | Ht 63.0 in | Wt 130.8 lb

## 2021-10-17 DIAGNOSIS — Z23 Encounter for immunization: Secondary | ICD-10-CM

## 2021-10-17 DIAGNOSIS — I10 Essential (primary) hypertension: Secondary | ICD-10-CM

## 2021-10-17 DIAGNOSIS — T466X5A Adverse effect of antihyperlipidemic and antiarteriosclerotic drugs, initial encounter: Secondary | ICD-10-CM

## 2021-10-17 DIAGNOSIS — F439 Reaction to severe stress, unspecified: Secondary | ICD-10-CM

## 2021-10-17 DIAGNOSIS — E7800 Pure hypercholesterolemia, unspecified: Secondary | ICD-10-CM

## 2021-10-17 DIAGNOSIS — E039 Hypothyroidism, unspecified: Secondary | ICD-10-CM

## 2021-10-17 DIAGNOSIS — I700 Atherosclerosis of aorta: Secondary | ICD-10-CM

## 2021-10-17 DIAGNOSIS — E1165 Type 2 diabetes mellitus with hyperglycemia: Secondary | ICD-10-CM | POA: Diagnosis not present

## 2021-10-17 DIAGNOSIS — D649 Anemia, unspecified: Secondary | ICD-10-CM

## 2021-10-17 DIAGNOSIS — G720 Drug-induced myopathy: Secondary | ICD-10-CM

## 2021-10-17 MED ORDER — ACCU-CHEK AVIVA PLUS VI STRP: ORAL_STRIP | 12 refills | Status: DC

## 2021-10-17 MED ORDER — REPATHA SURECLICK 140 MG/ML ~~LOC~~ SOAJ: 140.0000 mg | SUBCUTANEOUS | 3 refills | Status: DC

## 2021-10-17 NOTE — Progress Notes (Signed)
[name], female   DOB: August 06, 1935, 86 y.o.   MRN: [id number]   Subjective:    [name], female    DOB: [birth year], 86 y.o.   MRN: [id number]   Patient here for  Chief Complaint  Patient presents with   Follow-up    3 month follow up   .   HPI Here to follow up regarding hypercholesterolemia, diabetes, hypothyroidism and hypertension.  Reports breathing is stable.  No increased cough or congestion.  No acid reflux reported.  No chest pain.  No abdominal pain or bowel change reported.  Recent a1c 6.8.  checking sugars regularly.  With some fluctuations, she is checking more frequently - up to tid.  Discussed low carb diet and exercise.  Has f/u regarding her thyroid this week.     Past Medical History:  Diagnosis Date   Anemia    Diabetes mellitus ([place])    GERD (gastroesophageal reflux disease)    Hypercholesterolemia    Hypertension    Hypothyroidism    Osteoporosis    Past Surgical History:  Procedure Laterality Date   COLONOSCOPY     COLONOSCOPY WITH PROPOFOL N/A 11/09/2017   Procedure: COLONOSCOPY WITH PROPOFOL;  Surgeon: Lollie Sails, MD;  Location: [facility] ENDOSCOPY;  Service: Endoscopy;  Laterality: N/A;   CYST EXCISION     low back   ESOPHAGOGASTRODUODENOSCOPY (EGD) WITH PROPOFOL N/A 11/09/2017   Procedure: ESOPHAGOGASTRODUODENOSCOPY (EGD) WITH PROPOFOL;  Surgeon: Lollie Sails, MD;  Location: [facility] ENDOSCOPY;  Service: Endoscopy;  Laterality: N/A;   THYROID SURGERY  1992   left hemi-thyroidectomy (colloid goiter0   UPPER GASTROINTESTINAL ENDOSCOPY     Family History  Problem Relation Age of Onset   Emphysema Father    Heart disease Mother        myocardial infarction (78)   Hypertension Mother    Diabetes Mother    Heart disease Brother        drug use   Diabetes Mellitus II Sister        x3   Hypertension Sister    Breast cancer Other        niece   Breast cancer Sister    Cancer Sister    Colon cancer Neg Hx     Social History   Socioeconomic History   Marital status: Married    Spouse name: Not on file   Number of children: 2   Years of education: Not on file   Highest education level: Not on file  Occupational History   Not on file  Tobacco Use   Smoking status: Never   Smokeless tobacco: Never  Vaping Use   Vaping Use: Never used  Substance and Sexual Activity   Alcohol use: No    Alcohol/week: 0.0 standard drinks of alcohol    Comment: occasional   Drug use: Never   Sexual activity: Not on file  Other Topics Concern   Not on file  Social History Narrative   In [place]; with husband; never smoked;  no alcohol; telephone receptionist.    Social Determinants of Health   Financial Resource Strain: Low Risk  (06/28/2021)   Overall Financial Resource Strain (CARDIA)    Difficulty of Paying Living Expenses: Not hard at all  Food Insecurity: No Food Insecurity (06/28/2021)   Hunger Vital Sign    Worried About Running Out of Food in the Last Year: Never true    [place] in the Last Year: Never true  Transportation Needs: No Transportation Needs (06/28/2021)   PRAPARE - Hydrologist (Medical): No    Lack of Transportation (Non-Medical): No  Physical Activity: Unknown (06/13/2018)   Exercise Vital Sign    Days of Exercise per Week: 0 days    Minutes of Exercise per Session: Not on file  Stress: No Stress Concern Present (06/28/2021)   [place]    Feeling of Stress : Not at all  Social Connections: Unknown (06/28/2021)   Social Connection and Isolation Panel [NHANES]    Frequency of Communication with Friends and Family: More than three times a week    Frequency of Social Gatherings with Friends and Family: Not on file    Attends Religious Services: Not on file    Active Member of Clubs or Organizations: Not on file    Attends Archivist Meetings: Not on file     Marital Status: Married     Review of Systems  Constitutional:  Negative for appetite change and unexpected weight change.  HENT:  Negative for congestion and sinus pressure.   Respiratory:  Negative for cough, chest tightness and shortness of breath.   Cardiovascular:  Negative for chest pain, palpitations and leg swelling.  Gastrointestinal:  Negative for abdominal pain, diarrhea, nausea and vomiting.  Genitourinary:  Negative for difficulty urinating and dysuria.  Musculoskeletal:  Negative for joint swelling and myalgias.  Skin:  Negative for color change and rash.  Neurological:  Negative for dizziness, light-headedness and headaches.  Psychiatric/Behavioral:  Negative for agitation and dysphoric mood.        Objective:     BP 118/62 (BP Location: Left Arm, Patient Position: Sitting, Cuff Size: Normal)   Pulse 67   Temp 98.5 F (36.9 C) (Oral)   Ht 5' 3"  (1.6 m)   Wt 130 lb 12.8 oz (59.3 kg)   SpO2 97%   BMI 23.17 kg/m  Wt Readings from Last 3 Encounters:  10/17/21 130 lb 12.8 oz (59.3 kg)  07/13/21 130 lb 12.8 oz (59.3 kg)  06/28/21 135 lb (61.2 kg)    Physical Exam Vitals reviewed.  Constitutional:      General: She is not in acute distress.    Appearance: Normal appearance.  HENT:     Head: Normocephalic and atraumatic.     Right Ear: External ear normal.     Left Ear: External ear normal.  Eyes:     General: No scleral icterus.       Right eye: No discharge.        Left eye: No discharge.     Conjunctiva/sclera: Conjunctivae normal.  Neck:     Thyroid: No thyromegaly.  Cardiovascular:     Rate and Rhythm: Normal rate and regular rhythm.  Pulmonary:     Effort: No respiratory distress.     Breath sounds: Normal breath sounds. No wheezing.  Abdominal:     General: Bowel sounds are normal.     Palpations: Abdomen is soft.     Tenderness: There is no abdominal tenderness.  Musculoskeletal:        General: No swelling or tenderness.     Cervical  back: Neck supple. No tenderness.  Lymphadenopathy:     Cervical: No cervical adenopathy.  Skin:    Findings: No erythema or rash.  Neurological:     Mental Status: She is alert.  Psychiatric:        Mood and Affect:  Mood normal.        Behavior: Behavior normal.      Outpatient Encounter Medications as of 10/17/2021  Medication Sig   ACCU-CHEK SOFTCLIX LANCETS lancets CHECK BLOOD SUGAR ONCE A DAY DX E11.9   aspirin 81 MG tablet Take 81 mg by mouth daily.   Blood Glucose Calibration (ACCU-CHEK GUIDE CONTROL) LIQD Used to check glucometer for accurateness.   calcium-vitamin D (OSCAL WITH D) 250-125 MG-UNIT tablet Take 1 tablet by mouth daily.   Evolocumab (REPATHA SURECLICK) 299 MG/ML SOAJ Inject 140 mg into the skin every 14 (fourteen) days.   ezetimibe (ZETIA) 10 MG tablet TAKE 1 TABLET BY MOUTH EVERY DAY   glipiZIDE (GLUCOTROL XL) 5 MG 24 hr tablet TAKE 1 TABLET DAILY   JARDIANCE 25 MG TABS tablet TAKE 1 TABLET BY MOUTH EVERY DAY BEFORE BREAKFAST   lisinopril (ZESTRIL) 10 MG tablet TAKE 1 TABLET DAILY   Multiple Vitamin (MULTIVITAMIN) tablet Take 1 tablet by mouth daily.   omeprazole (PRILOSEC) 20 MG capsule TAKE 1 CAPSULE DAILY   [DISCONTINUED] ACCU-CHEK AVIVA PLUS test strip USE TO CHECK BLOOD SUGAR TWICE A DAY   glucose blood (ACCU-CHEK AVIVA PLUS) test strip USE TO CHECK BLOOD SUGAR THREE TIMES A DAY   No facility-administered encounter medications on file as of 10/17/2021.     Lab Results  Component Value Date   WBC 6.2 10/10/2021   HGB 11.8 (L) 10/10/2021   HCT 36.9 10/10/2021   PLT 240.0 10/10/2021   GLUCOSE 106 (H) 10/10/2021   CHOL 179 10/10/2021   TRIG 68.0 10/10/2021   HDL 51.90 10/10/2021   LDLCALC 114 (H) 10/10/2021   ALT 11 10/10/2021   AST 16 10/10/2021   NA 137 10/10/2021   K 4.4 10/10/2021   CL 105 10/10/2021   CREATININE 0.94 10/10/2021   BUN 23 10/10/2021   CO2 24 10/10/2021   TSH 0.64 03/22/2021   HGBA1C 6.8 (H) 10/10/2021   MICROALBUR 3.7 (H)  07/08/2021    MM 3D SCREEN BREAST BILATERAL  Result Date: 08/17/2021 CLINICAL DATA:  Screening. EXAM: DIGITAL SCREENING BILATERAL MAMMOGRAM WITH TOMOSYNTHESIS AND CAD TECHNIQUE: Bilateral screening digital craniocaudal and mediolateral oblique mammograms were obtained. Bilateral screening digital breast tomosynthesis was performed. The images were evaluated with computer-aided detection. COMPARISON:  Previous exam(s). ACR Breast Density Category b: There are scattered areas of fibroglandular density. FINDINGS: There are no findings suspicious for malignancy. IMPRESSION: No mammographic evidence of malignancy. A result letter of this screening mammogram will be mailed directly to the patient. RECOMMENDATION: Screening mammogram in one year. (Code:SM-B-01Y) BI-RADS CATEGORY  1: Negative. Electronically Signed   By: Lillia Mountain M.D.   On: 08/17/2021 10:23       Assessment & Plan:   Problem List Items Addressed This Visit     Anemia    Follow cbc.       Relevant Orders   Ferritin   CBC with Differential/Platelet   Aortic atherosclerosis ([place])    Intolerant to statins.  Continue Zetia. Low cholesterol diet and exercise.  Follow lipid panel.   Lab Results  Component Value Date   CHOL 179 10/10/2021   HDL 51.90 10/10/2021   LDLCALC 114 (H) 10/10/2021   TRIG 68.0 10/10/2021   CHOLHDL 3 10/10/2021       Relevant Medications   Evolocumab (REPATHA SURECLICK) 371 MG/ML SOAJ   Hypercholesterolemia    Intolerant to statin medication.  On zetia.  Low cholesterol diet and exercise.  Discussed other treatment options -  including repatha.  Agreeable.  Discussed need for instruction on proper way to give medication. Follow lipid panel.       Relevant Medications   Evolocumab (REPATHA SURECLICK) 161 MG/ML SOAJ   Other Relevant Orders   Lipid panel   Hepatic function panel   Hypertension    Continue lisinopril.  Blood pressure as outlined.   On jardiance 68m q day. Stay hydrated.   Follow  pressures.  Follow metabolic panel.       Relevant Medications   Evolocumab (REPATHA SURECLICK) 1096MG/ML SOAJ   Other Relevant Orders   Basic metabolic panel   Hypothyroidism    Previously evaluated by Dr SGabriel Carina  Recommended f/u in 12 months. Scheduled for f/u this week.  Follow tsh.       Relevant Orders   TSH   Statin myopathy    Intolerant to statin medication.  On zetia. Discussed repatha.       Stress    Overall appears to be handling things well. Does not feel needs any further intervention.  Follow.       Type 2 diabetes mellitus with hyperglycemia (HCC)    Continue jardiance and glipizide.  Tolerating and feels better.  Low carb diet and exercise.  Follow met b and a1c. Recent check 6.8.       Relevant Orders   Hemoglobin A1c   Other Visit Diagnoses     Need for immunization against influenza    -  Primary   Relevant Orders   Flu Vaccine QUAD High Dose(Fluad) (Completed)        CEinar Pheasant MD

## 2021-10-23 ENCOUNTER — Encounter: Payer: Self-pay | Admitting: Internal Medicine

## 2021-10-23 NOTE — Assessment & Plan Note (Signed)
Intolerant to statins.  Continue Zetia. Low cholesterol diet and exercise.  Follow lipid panel.   Lab Results  Component Value Date   CHOL 179 10/10/2021   HDL 51.90 10/10/2021   LDLCALC 114 (H) 10/10/2021   TRIG 68.0 10/10/2021   CHOLHDL 3 10/10/2021

## 2021-10-23 NOTE — Assessment & Plan Note (Signed)
Previously evaluated by Dr Gabriel Carina.  Recommended f/u in 12 months. Scheduled for f/u this week.  Follow tsh.

## 2021-10-23 NOTE — Assessment & Plan Note (Signed)
Follow cbc.  

## 2021-10-23 NOTE — Assessment & Plan Note (Signed)
Continue lisinopril.  Blood pressure as outlined.   On jardiance 25mg q day. Stay hydrated.   Follow pressures.  Follow metabolic panel.  

## 2021-10-23 NOTE — Assessment & Plan Note (Signed)
Intolerant to statin medication.  On zetia. Discussed repatha.

## 2021-10-23 NOTE — Assessment & Plan Note (Signed)
Continue jardiance and glipizide.  Tolerating and feels better.  Low carb diet and exercise.  Follow met b and a1c. Recent check 6.8.  

## 2021-10-23 NOTE — Assessment & Plan Note (Signed)
Intolerant to statin medication.  On zetia.  Low cholesterol diet and exercise.  Discussed other treatment options - including repatha.  Agreeable.  Discussed need for instruction on proper way to give medication. Follow lipid panel.

## 2021-10-23 NOTE — Assessment & Plan Note (Signed)
Overall appears to be handling things well. Does not feel needs any further intervention.  Follow.

## 2021-11-16 ENCOUNTER — Telehealth: Payer: Self-pay

## 2021-11-16 NOTE — Telephone Encounter (Signed)
Appeal filed for coverage of [place] with [employer]  Case ID # - [id number]  Labs faxed to [phone]   Filed as Urgent - decision within 72hrs

## 2021-11-23 ENCOUNTER — Other Ambulatory Visit: Payer: Self-pay | Admitting: Internal Medicine

## 2021-11-29 ENCOUNTER — Ambulatory Visit (INDEPENDENT_AMBULATORY_CARE_PROVIDER_SITE_OTHER): Payer: Medicare Other

## 2021-11-29 DIAGNOSIS — E7800 Pure hypercholesterolemia, unspecified: Secondary | ICD-10-CM | POA: Diagnosis not present

## 2021-11-29 NOTE — Progress Notes (Signed)
Pt presented for a Repatha teaching. Pt was identified through two identifiers. Both I and Cannon Kettle was present as we showed the pt a training video on how to use Repatha Sureclick. After the video we demonstrated to the pt using a demo tool and had the pt also use the demo tool to ensure she understood how to use it. The pt then asked was she supposed to stop taking the lipitor as she is allergic to statin and she believed that's why Dr. Nicki Reaper was putting her on Repatha. LaToya advised her to stop taking the lipitor and to just take the [place].  [place]avia, CMA

## 2021-12-07 ENCOUNTER — Other Ambulatory Visit: Payer: Self-pay | Admitting: Family

## 2021-12-09 ENCOUNTER — Other Ambulatory Visit: Payer: Self-pay

## 2021-12-09 ENCOUNTER — Telehealth: Payer: Self-pay | Admitting: Internal Medicine

## 2021-12-09 DIAGNOSIS — E1165 Type 2 diabetes mellitus with hyperglycemia: Secondary | ICD-10-CM

## 2021-12-09 MED ORDER — EMPAGLIFLOZIN 25 MG PO TABS: ORAL_TABLET | ORAL | 2 refills | Status: DC

## 2021-12-09 NOTE — Telephone Encounter (Signed)
Medication sent to the patients pharmacy at her request. Amandamarie Feggins,cma

## 2021-12-09 NOTE — Telephone Encounter (Signed)
Pt need refill on JARDIANCE sent to [facility]

## 2022-01-19 ENCOUNTER — Other Ambulatory Visit (INDEPENDENT_AMBULATORY_CARE_PROVIDER_SITE_OTHER): Payer: Medicare Other

## 2022-01-19 DIAGNOSIS — E7800 Pure hypercholesterolemia, unspecified: Secondary | ICD-10-CM

## 2022-01-19 DIAGNOSIS — I10 Essential (primary) hypertension: Secondary | ICD-10-CM

## 2022-01-19 DIAGNOSIS — E039 Hypothyroidism, unspecified: Secondary | ICD-10-CM

## 2022-01-19 DIAGNOSIS — D649 Anemia, unspecified: Secondary | ICD-10-CM | POA: Diagnosis not present

## 2022-01-19 DIAGNOSIS — E1165 Type 2 diabetes mellitus with hyperglycemia: Secondary | ICD-10-CM | POA: Diagnosis not present

## 2022-01-19 LAB — LIPID PANEL
Cholesterol: 95 mg/dL (ref 0–200)
HDL: 49.5 mg/dL (ref >39.00)
LDL Cholesterol: 36 mg/dL (ref 0–99)
NonHDL: 45.88
Total CHOL/HDL Ratio: 2
Triglycerides: 47 mg/dL (ref 0.0–149.0)
VLDL: 9.4 mg/dL (ref 0.0–40.0)

## 2022-01-19 LAB — CBC WITH DIFFERENTIAL/PLATELET
Basophils Absolute: 0.1 10*3/uL (ref 0.0–0.1)
Basophils Relative: 1.2 % (ref 0.0–3.0)
Eosinophils Absolute: 0.1 10*3/uL (ref 0.0–0.7)
Eosinophils Relative: 1.7 % (ref 0.0–5.0)
HCT: 36.9 % (ref 36.0–46.0)
Hemoglobin: 11.7 g/dL — ABNORMAL LOW (ref 12.0–15.0)
Lymphocytes Relative: 24.3 % (ref 12.0–46.0)
Lymphs Abs: 2 10*3/uL (ref 0.7–4.0)
MCHC: 31.8 g/dL (ref 30.0–36.0)
MCV: 77.8 fl — ABNORMAL LOW (ref 78.0–100.0)
Monocytes Absolute: 0.7 10*3/uL (ref 0.1–1.0)
Monocytes Relative: 8 % (ref 3.0–12.0)
Neutro Abs: 5.3 10*3/uL (ref 1.4–7.7)
Neutrophils Relative %: 64.8 % (ref 43.0–77.0)
Platelets: 311 10*3/uL (ref 150.0–400.0)
RBC: 4.74 Mil/uL (ref 3.87–5.11)
RDW: 17.8 % — ABNORMAL HIGH (ref 11.5–15.5)
WBC: 8.1 10*3/uL (ref 4.0–10.5)

## 2022-01-19 LAB — BASIC METABOLIC PANEL
BUN: 21 mg/dL (ref 6–23)
CO2: 26 mEq/L (ref 19–32)
Calcium: 9.4 mg/dL (ref 8.4–10.5)
Chloride: 105 mEq/L (ref 96–112)
Creatinine, Ser: 0.92 mg/dL (ref 0.40–1.20)
GFR: 57.07 mL/min — ABNORMAL LOW (ref >60.00)
Glucose, Bld: 95 mg/dL (ref 70–99)
Potassium: 4.4 mEq/L (ref 3.5–5.1)
Sodium: 138 mEq/L (ref 135–145)

## 2022-01-19 LAB — HEPATIC FUNCTION PANEL
ALT: 13 U/L (ref 0–35)
AST: 15 U/L (ref 0–37)
Albumin: 4.2 g/dL (ref 3.5–5.2)
Alkaline Phosphatase: 44 U/L (ref 39–117)
Bilirubin, Direct: 0.1 mg/dL (ref 0.0–0.3)
Total Bilirubin: 0.4 mg/dL (ref 0.2–1.2)
Total Protein: 7.3 g/dL (ref 6.0–8.3)

## 2022-01-19 LAB — FERRITIN: Ferritin: 78.5 ng/mL (ref 10.0–291.0)

## 2022-01-19 LAB — HEMOGLOBIN A1C: Hgb A1c MFr Bld: 7 % — ABNORMAL HIGH (ref 4.6–6.5)

## 2022-01-19 LAB — TSH: TSH: 0.68 u[IU]/mL (ref 0.35–5.50)

## 2022-01-24 ENCOUNTER — Ambulatory Visit (INDEPENDENT_AMBULATORY_CARE_PROVIDER_SITE_OTHER): Payer: Medicare Other | Admitting: Internal Medicine

## 2022-01-24 ENCOUNTER — Encounter: Payer: Self-pay | Admitting: Internal Medicine

## 2022-01-24 VITALS — BP 122/74 | HR 68 | Temp 97.8°F | Resp 18 | Ht 63.0 in | Wt 132.0 lb

## 2022-01-24 DIAGNOSIS — I700 Atherosclerosis of aorta: Secondary | ICD-10-CM

## 2022-01-24 DIAGNOSIS — D649 Anemia, unspecified: Secondary | ICD-10-CM

## 2022-01-24 DIAGNOSIS — G720 Drug-induced myopathy: Secondary | ICD-10-CM

## 2022-01-24 DIAGNOSIS — E039 Hypothyroidism, unspecified: Secondary | ICD-10-CM

## 2022-01-24 DIAGNOSIS — E7800 Pure hypercholesterolemia, unspecified: Secondary | ICD-10-CM

## 2022-01-24 DIAGNOSIS — I10 Essential (primary) hypertension: Secondary | ICD-10-CM | POA: Diagnosis not present

## 2022-01-24 DIAGNOSIS — Z0000 Encounter for general adult medical examination without abnormal findings: Secondary | ICD-10-CM

## 2022-01-24 DIAGNOSIS — E1165 Type 2 diabetes mellitus with hyperglycemia: Secondary | ICD-10-CM

## 2022-01-24 DIAGNOSIS — T466X5A Adverse effect of antihyperlipidemic and antiarteriosclerotic drugs, initial encounter: Secondary | ICD-10-CM

## 2022-01-24 DIAGNOSIS — H6122 Impacted cerumen, left ear: Secondary | ICD-10-CM

## 2022-01-24 MED ORDER — REPATHA SURECLICK 140 MG/ML ~~LOC~~ SOAJ: 140.0000 mg | SUBCUTANEOUS | 3 refills | Status: DC

## 2022-01-24 MED ORDER — OMEPRAZOLE 20 MG PO CPDR: 20.0000 mg | DELAYED_RELEASE_CAPSULE | Freq: Every day | ORAL | 3 refills | Status: DC

## 2022-01-24 MED ORDER — GLIPIZIDE ER 5 MG PO TB24: 5.0000 mg | ORAL_TABLET | Freq: Every day | ORAL | 3 refills | Status: DC

## 2022-01-24 MED ORDER — LISINOPRIL 10 MG PO TABS: 10.0000 mg | ORAL_TABLET | Freq: Every day | ORAL | 3 refills | Status: DC

## 2022-01-24 MED ORDER — EMPAGLIFLOZIN 25 MG PO TABS: ORAL_TABLET | ORAL | 3 refills | Status: DC

## 2022-01-24 NOTE — Progress Notes (Signed)
Subjective:    [name], female    DOB: Oct 11, 1935, 87 y.o.   MRN: [id number]  Patient here for  Chief Complaint  Patient presents with   Annual Exam    HPI Here for physical exam. Reports she is doing relatively well.  Tries to stay active.  No chest pain or sob reported.  No abdominal pain. Last visit, discussed repatha. On repatha and tolerating.  Discussed labs.  Cholesterol improved.  A1c 7.0. On jardiance and glipizide for sugar.    Past Medical History:  Diagnosis Date   Anemia    Diabetes mellitus ([place])    GERD (gastroesophageal reflux disease)    Hypercholesterolemia    Hypertension    Hypothyroidism    Osteoporosis    Past Surgical History:  Procedure Laterality Date   COLONOSCOPY     COLONOSCOPY WITH PROPOFOL N/A 11/09/2017   Procedure: COLONOSCOPY WITH PROPOFOL;  Surgeon: Lollie Sails, MD;  Location: [facility] ENDOSCOPY;  Service: Endoscopy;  Laterality: N/A;   CYST EXCISION     low back   ESOPHAGOGASTRODUODENOSCOPY (EGD) WITH PROPOFOL N/A 11/09/2017   Procedure: ESOPHAGOGASTRODUODENOSCOPY (EGD) WITH PROPOFOL;  Surgeon: Lollie Sails, MD;  Location: [facility] ENDOSCOPY;  Service: Endoscopy;  Laterality: N/A;   THYROID SURGERY  1992   left hemi-thyroidectomy (colloid goiter0   UPPER GASTROINTESTINAL ENDOSCOPY     Family History  Problem Relation Age of Onset   Emphysema Father    Heart disease Mother        myocardial infarction (55)   Hypertension Mother    Diabetes Mother    Heart disease Brother        drug use   Diabetes Mellitus II Sister        x3   Hypertension Sister    Breast cancer Other        niece   Breast cancer Sister    Cancer Sister    Colon cancer Neg Hx    Social History   Socioeconomic History   Marital status: Married    Spouse name: Not on file   Number of children: 2   Years of education: Not on file   Highest education level: Not on file  Occupational History   Not on file  Tobacco Use   Smoking  status: Never   Smokeless tobacco: Never  Vaping Use   Vaping Use: Never used  Substance and Sexual Activity   Alcohol use: No    Alcohol/week: 0.0 standard drinks of alcohol    Comment: occasional   Drug use: Never   Sexual activity: Not on file  Other Topics Concern   Not on file  Social History Narrative   In [place]; with husband; never smoked;  no alcohol; telephone receptionist.    Social Determinants of Health   Financial Resource Strain: Low Risk  (06/28/2021)   Overall Financial Resource Strain (CARDIA)    Difficulty of Paying Living Expenses: Not hard at all  Food Insecurity: No Food Insecurity (06/28/2021)   Hunger Vital Sign    Worried About Running Out of Food in the Last Year: Never true    [place] in the Last Year: Never true  Transportation Needs: No Transportation Needs (06/28/2021)   PRAPARE - Hydrologist (Medical): No    Lack of Transportation (Non-Medical): No  Physical Activity: Unknown (06/13/2018)   Exercise Vital Sign    Days of Exercise per Week: 0 days  Minutes of Exercise per Session: Not on file  Stress: No Stress Concern Present (06/28/2021)   [place]    Feeling of Stress : Not at all  Social Connections: Unknown (06/28/2021)   Social Connection and Isolation Panel [NHANES]    Frequency of Communication with Friends and Family: More than three times a week    Frequency of Social Gatherings with Friends and Family: Not on file    Attends Religious Services: Not on file    Active Member of Clubs or Organizations: Not on file    Attends Archivist Meetings: Not on file    Marital Status: Married     Review of Systems  Constitutional:  Negative for appetite change and unexpected weight change.  HENT:  Negative for congestion, sinus pressure and sore throat.   Eyes:  Negative for pain and visual disturbance.  Respiratory:  Negative  for cough, chest tightness and shortness of breath.   Cardiovascular:  Negative for chest pain, palpitations and leg swelling.  Gastrointestinal:  Negative for abdominal pain, diarrhea, nausea and vomiting.  Genitourinary:  Negative for difficulty urinating, dysuria and frequency.  Musculoskeletal:  Negative for joint swelling and myalgias.  Skin:  Negative for color change and rash.  Neurological:  Negative for dizziness and headaches.  Hematological:  Negative for adenopathy. Does not bruise/bleed easily.  Psychiatric/Behavioral:  Negative for agitation and dysphoric mood.        Objective:     BP 122/74 (BP Location: Left Arm, Patient Position: Sitting, Cuff Size: Normal)   Pulse 68   Temp 97.8 F (36.6 C) (Oral)   Resp 18   Ht '5\' 3"'$  (1.6 m)   Wt 132 lb (59.9 kg)   SpO2 98%   BMI 23.38 kg/m  Wt Readings from Last 3 Encounters:  01/24/22 132 lb (59.9 kg)  10/17/21 130 lb 12.8 oz (59.3 kg)  07/13/21 130 lb 12.8 oz (59.3 kg)    Physical Exam Vitals reviewed.  Constitutional:      General: She is not in acute distress.    Appearance: Normal appearance. She is well-developed.  HENT:     Head: Normocephalic and atraumatic.     Right Ear: External ear normal.     Left Ear: External ear normal.  Eyes:     General: No scleral icterus.       Right eye: No discharge.        Left eye: No discharge.     Conjunctiva/sclera: Conjunctivae normal.  Neck:     Thyroid: No thyromegaly.  Cardiovascular:     Rate and Rhythm: Normal rate and regular rhythm.  Pulmonary:     Effort: No tachypnea, accessory muscle usage or respiratory distress.     Breath sounds: Normal breath sounds. No decreased breath sounds or wheezing.  Chest:  Breasts:    Right: No inverted nipple, mass, nipple discharge or tenderness (no axillary adenopathy).     Left: No inverted nipple, mass, nipple discharge or tenderness (no axilarry adenopathy).  Abdominal:     General: Bowel sounds are normal.      Palpations: Abdomen is soft.     Tenderness: There is no abdominal tenderness.  Musculoskeletal:        General: No swelling or tenderness.     Cervical back: Neck supple.  Lymphadenopathy:     Cervical: No cervical adenopathy.  Skin:    Findings: No erythema or rash.  Neurological:  Mental Status: She is alert and oriented to person, place, and time.  Psychiatric:        Mood and Affect: Mood normal.        Behavior: Behavior normal.      Outpatient Encounter Medications as of 01/24/2022  Medication Sig   ACCU-CHEK SOFTCLIX LANCETS lancets CHECK BLOOD SUGAR ONCE A DAY DX E11.9   aspirin 81 MG tablet Take 81 mg by mouth daily.   Blood Glucose Calibration (ACCU-CHEK GUIDE CONTROL) LIQD Used to check glucometer for accurateness.   calcium-vitamin D (OSCAL WITH D) 250-125 MG-UNIT tablet Take 1 tablet by mouth daily.   empagliflozin (JARDIANCE) 25 MG TABS tablet TAKE 1 TABLET BY MOUTH EVERY DAY BEFORE BREAKFAST   Evolocumab (REPATHA SURECLICK) 379 MG/ML SOAJ Inject 140 mg into the skin every 14 (fourteen) days. in   ezetimibe (ZETIA) 10 MG tablet TAKE 1 TABLET BY MOUTH EVERY DAY   glipiZIDE (GLUCOTROL XL) 5 MG 24 hr tablet Take 1 tablet (5 mg total) by mouth daily.   glucose blood (ACCU-CHEK AVIVA PLUS) test strip USE TO CHECK BLOOD SUGAR THREE TIMES A DAY   lisinopril (ZESTRIL) 10 MG tablet Take 1 tablet (10 mg total) by mouth daily.   Multiple Vitamin (MULTIVITAMIN) tablet Take 1 tablet by mouth daily.   omeprazole (PRILOSEC) 20 MG capsule Take 1 capsule (20 mg total) by mouth daily.   [DISCONTINUED] empagliflozin (JARDIANCE) 25 MG TABS tablet TAKE 1 TABLET BY MOUTH EVERY DAY BEFORE BREAKFAST   [DISCONTINUED] Evolocumab (REPATHA SURECLICK) 024 MG/ML SOAJ INJECT 140 MG INTO THE SKIN EVERY 14 (FOURTEEN) DAYS. (PA DENIED)   [DISCONTINUED] glipiZIDE (GLUCOTROL XL) 5 MG 24 hr tablet TAKE 1 TABLET DAILY   [DISCONTINUED] lisinopril (ZESTRIL) 10 MG tablet TAKE 1 TABLET DAILY    [DISCONTINUED] omeprazole (PRILOSEC) 20 MG capsule TAKE 1 CAPSULE DAILY   No facility-administered encounter medications on file as of 01/24/2022.     Lab Results  Component Value Date   WBC 8.1 01/19/2022   HGB 11.7 (L) 01/19/2022   HCT 36.9 01/19/2022   PLT 311.0 01/19/2022   GLUCOSE 95 01/19/2022   CHOL 95 01/19/2022   TRIG 47.0 01/19/2022   HDL 49.50 01/19/2022   LDLCALC 36 01/19/2022   ALT 13 01/19/2022   AST 15 01/19/2022   NA 138 01/19/2022   K 4.4 01/19/2022   CL 105 01/19/2022   CREATININE 0.92 01/19/2022   BUN 21 01/19/2022   CO2 26 01/19/2022   TSH 0.68 01/19/2022   HGBA1C 7.0 (H) 01/19/2022   MICROALBUR 3.7 (H) 07/08/2021    MM 3D SCREEN BREAST BILATERAL  Result Date: 08/17/2021 CLINICAL DATA:  Screening. EXAM: DIGITAL SCREENING BILATERAL MAMMOGRAM WITH TOMOSYNTHESIS AND CAD TECHNIQUE: Bilateral screening digital craniocaudal and mediolateral oblique mammograms were obtained. Bilateral screening digital breast tomosynthesis was performed. The images were evaluated with computer-aided detection. COMPARISON:  Previous exam(s). ACR Breast Density Category b: There are scattered areas of fibroglandular density. FINDINGS: There are no findings suspicious for malignancy. IMPRESSION: No mammographic evidence of malignancy. A result letter of this screening mammogram will be mailed directly to the patient. RECOMMENDATION: Screening mammogram in one year. (Code:SM-B-01Y) BI-RADS CATEGORY  1: Negative. Electronically Signed   By: Lillia Mountain M.D.   On: 08/17/2021 10:23       Assessment & Plan:  Routine general medical examination at a health care facility  Type 2 diabetes mellitus with hyperglycemia, without long-term current use of insulin ([place]) Assessment & Plan: Continue jardiance and  glipizide.  Tolerating and feels better.  Low carb diet and exercise.  Follow met b and a1c. Recent check 7.0.  slight increase.  Follow.  Hold on additional medication.   Orders: -      Empagliflozin; TAKE 1 TABLET BY MOUTH EVERY DAY BEFORE BREAKFAST  Dispense: 90 tablet; Refill: 3 -     Hemoglobin A1c; Future  Hypercholesterolemia Assessment & Plan: Intolerant to statin medication.  On zetia and repatha. Cholesterol significantly improved.  Will stop zetia.  Low cholesterol diet and exercise.  Follow lipid panel.   Orders: -     Hepatic function panel; Future -     Lipid panel; Future -     Basic metabolic panel; Future  Primary hypertension Assessment & Plan: Continue lisinopril.  Blood pressure as outlined.   On jardiance '25mg'$  q day. Stay hydrated.   Follow pressures.  Follow metabolic panel.   Orders: -     CBC with Differential/Platelet; Future  Anemia, unspecified type Assessment & Plan: Recent hgb 11.7.  follow cbc.   Orders: -     CBC with Differential/Platelet; Future  Aortic atherosclerosis ([place]) Assessment & Plan: Continue repatha.    Impacted cerumen of left ear Assessment & Plan: Left ear.  Debrox.  Can return for ear irrigation.   Health care maintenance Assessment & Plan: Physical today 01/24/22.   Colonoscopy 11/2017.  Mammogram 08/16/21 - Birads I.    Hypothyroidism, unspecified type Assessment & Plan: Evaluated by Dr Gabriel Carina.  Recommended f/u in 12 months. Just evaluated 10/2021.  Follow tsh.   Orders: -     TSH; Future  Statin myopathy Assessment & Plan: Intolerant to statin medication.  On zetia and repatha.    Other orders -     glipiZIDE ER; Take 1 tablet (5 mg total) by mouth daily.  Dispense: 90 tablet; Refill: 3 -     Lisinopril; Take 1 tablet (10 mg total) by mouth daily.  Dispense: 90 tablet; Refill: 3 -     Omeprazole; Take 1 capsule (20 mg total) by mouth daily.  Dispense: 90 capsule; Refill: 3 -     Repatha SureClick; Inject 140 mg into the skin every 14 (fourteen) days. in  Dispense: 6 mL; Refill: 3     Einar Pheasant, MD

## 2022-01-28 ENCOUNTER — Encounter: Payer: Self-pay | Admitting: Internal Medicine

## 2022-01-28 NOTE — Assessment & Plan Note (Signed)
Recent hgb 11.7.  follow cbc.

## 2022-01-28 NOTE — Assessment & Plan Note (Signed)
Continue lisinopril.  Blood pressure as outlined.   On jardiance '25mg'$  q day. Stay hydrated.   Follow pressures.  Follow metabolic panel.

## 2022-01-28 NOTE — Assessment & Plan Note (Signed)
Intolerant to statin medication.  On zetia and repatha.

## 2022-01-28 NOTE — Assessment & Plan Note (Signed)
Physical today 01/24/22.   Colonoscopy 11/2017.  Mammogram 08/16/21 - Birads I.

## 2022-01-28 NOTE — Assessment & Plan Note (Signed)
Continue repatha.

## 2022-01-28 NOTE — Assessment & Plan Note (Signed)
Left ear.  Debrox.  Can return for ear irrigation.

## 2022-01-28 NOTE — Assessment & Plan Note (Signed)
Intolerant to statin medication.  On zetia and repatha. Cholesterol significantly improved.  Will stop zetia.  Low cholesterol diet and exercise.  Follow lipid panel.

## 2022-01-28 NOTE — Assessment & Plan Note (Signed)
Evaluated by Dr Gabriel Carina.  Recommended f/u in 12 months. Just evaluated 10/2021.  Follow tsh.

## 2022-01-28 NOTE — Assessment & Plan Note (Signed)
Continue jardiance and glipizide.  Tolerating and feels better.  Low carb diet and exercise.  Follow met b and a1c. Recent check 7.0.  slight increase.  Follow.  Hold on additional medication.

## 2022-02-09 ENCOUNTER — Other Ambulatory Visit: Payer: Self-pay | Admitting: Internal Medicine

## 2022-02-28 ENCOUNTER — Telehealth: Payer: Self-pay | Admitting: Internal Medicine

## 2022-02-28 NOTE — Telephone Encounter (Signed)
Form completed and placed in quick sign for signature. Has to be completed in order to get replacement pen.

## 2022-02-28 NOTE — Telephone Encounter (Signed)
Patient called, she has issues with one of her Evolocumab (REPATHA SURECLICK) [id number] MG/ML SOAJ pens, She called the company, they  told her that her provider would have to call [facility] [phone] , ref # [id number] for a replacement pen.   [facility] [place] in [place],

## 2022-02-28 NOTE — Telephone Encounter (Signed)
Signed and placed in box.   

## 2022-03-01 NOTE — Telephone Encounter (Signed)
Form faxed to [facility]

## 2022-03-01 NOTE — Telephone Encounter (Signed)
Pt called back and I read the message to her and she verbalized understanding 

## 2022-03-01 NOTE — Telephone Encounter (Signed)
Left message to let patient know that her form for her replacement repatha pen has been faxed.

## 2022-03-01 NOTE — Telephone Encounter (Signed)
noted 

## 2022-03-06 ENCOUNTER — Other Ambulatory Visit: Payer: Self-pay | Admitting: Internal Medicine

## 2022-03-16 LAB — HM DIABETES EYE EXAM

## 2022-03-26 ENCOUNTER — Other Ambulatory Visit: Payer: Self-pay | Admitting: Internal Medicine

## 2022-05-22 ENCOUNTER — Other Ambulatory Visit (INDEPENDENT_AMBULATORY_CARE_PROVIDER_SITE_OTHER): Payer: Medicare Other

## 2022-05-22 DIAGNOSIS — D649 Anemia, unspecified: Secondary | ICD-10-CM | POA: Diagnosis not present

## 2022-05-22 DIAGNOSIS — E039 Hypothyroidism, unspecified: Secondary | ICD-10-CM

## 2022-05-22 DIAGNOSIS — E1165 Type 2 diabetes mellitus with hyperglycemia: Secondary | ICD-10-CM

## 2022-05-22 DIAGNOSIS — E7800 Pure hypercholesterolemia, unspecified: Secondary | ICD-10-CM | POA: Diagnosis not present

## 2022-05-22 DIAGNOSIS — I10 Essential (primary) hypertension: Secondary | ICD-10-CM | POA: Diagnosis not present

## 2022-05-22 LAB — BASIC METABOLIC PANEL
BUN: 20 mg/dL (ref 6–23)
CO2: 26 mEq/L (ref 19–32)
Calcium: 9.6 mg/dL (ref 8.4–10.5)
Chloride: 103 mEq/L (ref 96–112)
Creatinine, Ser: 0.92 mg/dL (ref 0.40–1.20)
GFR: 56.93 mL/min — ABNORMAL LOW (ref >60.00)
Glucose, Bld: 115 mg/dL — ABNORMAL HIGH (ref 70–99)
Potassium: 4.5 mEq/L (ref 3.5–5.1)
Sodium: 138 mEq/L (ref 135–145)

## 2022-05-22 LAB — CBC WITH DIFFERENTIAL/PLATELET
Basophils Absolute: 0.1 10*3/uL (ref 0.0–0.1)
Basophils Relative: 1.2 % (ref 0.0–3.0)
Eosinophils Absolute: 0.2 10*3/uL (ref 0.0–0.7)
Eosinophils Relative: 2.2 % (ref 0.0–5.0)
HCT: 38.5 % (ref 36.0–46.0)
Hemoglobin: 12.4 g/dL (ref 12.0–15.0)
Lymphocytes Relative: 24.5 % (ref 12.0–46.0)
Lymphs Abs: 2.1 10*3/uL (ref 0.7–4.0)
MCHC: 32.2 g/dL (ref 30.0–36.0)
MCV: 79.1 fl (ref 78.0–100.0)
Monocytes Absolute: 0.7 10*3/uL (ref 0.1–1.0)
Monocytes Relative: 8.5 % (ref 3.0–12.0)
Neutro Abs: 5.4 10*3/uL (ref 1.4–7.7)
Neutrophils Relative %: 63.6 % (ref 43.0–77.0)
Platelets: 263 10*3/uL (ref 150.0–400.0)
RBC: 4.86 Mil/uL (ref 3.87–5.11)
RDW: 17.1 % — ABNORMAL HIGH (ref 11.5–15.5)
WBC: 8.5 10*3/uL (ref 4.0–10.5)

## 2022-05-22 LAB — HEPATIC FUNCTION PANEL
ALT: 14 U/L (ref 0–35)
AST: 17 U/L (ref 0–37)
Albumin: 4.2 g/dL (ref 3.5–5.2)
Alkaline Phosphatase: 45 U/L (ref 39–117)
Bilirubin, Direct: 0.1 mg/dL (ref 0.0–0.3)
Total Bilirubin: 0.5 mg/dL (ref 0.2–1.2)
Total Protein: 7.4 g/dL (ref 6.0–8.3)

## 2022-05-22 LAB — LIPID PANEL
Cholesterol: 106 mg/dL (ref 0–200)
HDL: 51.7 mg/dL (ref >39.00)
LDL Cholesterol: 41 mg/dL (ref 0–99)
NonHDL: 53.88
Total CHOL/HDL Ratio: 2
Triglycerides: 65 mg/dL (ref 0.0–149.0)
VLDL: 13 mg/dL (ref 0.0–40.0)

## 2022-05-22 LAB — TSH: TSH: 0.58 u[IU]/mL (ref 0.35–5.50)

## 2022-05-22 LAB — HEMOGLOBIN A1C: Hgb A1c MFr Bld: 6.8 % — ABNORMAL HIGH (ref 4.6–6.5)

## 2022-05-25 ENCOUNTER — Encounter: Payer: Self-pay | Admitting: Internal Medicine

## 2022-05-25 ENCOUNTER — Ambulatory Visit (INDEPENDENT_AMBULATORY_CARE_PROVIDER_SITE_OTHER): Payer: Medicare Other | Admitting: Internal Medicine

## 2022-05-25 VITALS — BP 110/68 | HR 86 | Temp 97.9°F | Resp 16 | Ht 63.0 in | Wt 128.4 lb

## 2022-05-25 DIAGNOSIS — D649 Anemia, unspecified: Secondary | ICD-10-CM | POA: Diagnosis not present

## 2022-05-25 DIAGNOSIS — R202 Paresthesia of skin: Secondary | ICD-10-CM

## 2022-05-25 DIAGNOSIS — I10 Essential (primary) hypertension: Secondary | ICD-10-CM | POA: Diagnosis not present

## 2022-05-25 DIAGNOSIS — I700 Atherosclerosis of aorta: Secondary | ICD-10-CM | POA: Diagnosis not present

## 2022-05-25 DIAGNOSIS — E039 Hypothyroidism, unspecified: Secondary | ICD-10-CM

## 2022-05-25 DIAGNOSIS — Z7984 Long term (current) use of oral hypoglycemic drugs: Secondary | ICD-10-CM

## 2022-05-25 DIAGNOSIS — R200 Anesthesia of skin: Secondary | ICD-10-CM

## 2022-05-25 DIAGNOSIS — E1165 Type 2 diabetes mellitus with hyperglycemia: Secondary | ICD-10-CM

## 2022-05-25 DIAGNOSIS — E7800 Pure hypercholesterolemia, unspecified: Secondary | ICD-10-CM | POA: Diagnosis not present

## 2022-05-25 DIAGNOSIS — G720 Drug-induced myopathy: Secondary | ICD-10-CM

## 2022-05-25 NOTE — Patient Instructions (Signed)
Stop zetia  Benefiber daily - for bowels  Alpha lipoic acid 600mg  - take one per day - for neuropathy.

## 2022-05-25 NOTE — Progress Notes (Signed)
Subjective:    [name], female    DOB: 12-Sep-1935, 87 y.o.   MRN: [id number]  Patient here for  Chief Complaint  Patient presents with   Medical Management of Chronic Issues    HPI Here to follow up regarding hypercholesterolemia (on repatha), hypertension and diabetes.  Continues on jardiance and glipizide.  Discussed labs.  A1c 6.8. tries to stay active.  No chest pain or sob reported.  No increased cough or congestion.  Discussed bowels.  Discussed using benefiber to help regulate her bowels.  Some neuropathy issues.  Discussed alpha lipoic acid.  Has tried gabapentin.    Past Medical History:  Diagnosis Date   Anemia    Diabetes mellitus (HCC)    GERD (gastroesophageal reflux disease)    Hypercholesterolemia    Hypertension    Hypothyroidism    Osteoporosis    Past Surgical History:  Procedure Laterality Date   COLONOSCOPY     COLONOSCOPY WITH PROPOFOL N/A 11/09/2017   Procedure: COLONOSCOPY WITH PROPOFOL;  Surgeon: Christena Deem, MD;  Location: [facility] ENDOSCOPY;  Service: Endoscopy;  Laterality: N/A;   CYST EXCISION     low back   ESOPHAGOGASTRODUODENOSCOPY (EGD) WITH PROPOFOL N/A 11/09/2017   Procedure: ESOPHAGOGASTRODUODENOSCOPY (EGD) WITH PROPOFOL;  Surgeon: Christena Deem, MD;  Location: [facility] ENDOSCOPY;  Service: Endoscopy;  Laterality: N/A;   THYROID SURGERY  1992   left hemi-thyroidectomy (colloid goiter0   UPPER GASTROINTESTINAL ENDOSCOPY     Family History  Problem Relation Age of Onset   Emphysema Father    Heart disease Mother        myocardial infarction (71)   Hypertension Mother    Diabetes Mother    Heart disease Brother        drug use   Diabetes Mellitus II Sister        x3   Hypertension Sister    Breast cancer Other        niece   Breast cancer Sister    Cancer Sister    Colon cancer Neg Hx    Social History   Socioeconomic History   Marital status: Married    Spouse name: Not on file   Number of children: 2    Years of education: Not on file   Highest education level: Not on file  Occupational History   Not on file  Tobacco Use   Smoking status: Never   Smokeless tobacco: Never  Vaping Use   Vaping Use: Never used  Substance and Sexual Activity   Alcohol use: No    Alcohol/week: 0.0 standard drinks of alcohol    Comment: occasional   Drug use: Never   Sexual activity: Not on file  Other Topics Concern   Not on file  Social History Narrative   In [place]; with husband; never smoked;  no alcohol; telephone receptionist.    Social Determinants of Health   Financial Resource Strain: Low Risk  (06/28/2021)   Overall Financial Resource Strain (CARDIA)    Difficulty of Paying Living Expenses: Not hard at all  Food Insecurity: No Food Insecurity (06/28/2021)   Hunger Vital Sign    Worried About Running Out of Food in the Last Year: Never true    Ran Out of Food in the Last Year: Never true  Transportation Needs: No Transportation Needs (06/28/2021)   PRAPARE - Administrator, Civil Service (Medical): No    Lack of Transportation (Non-Medical): No  Physical Activity: Unknown (  06/13/2018)   Exercise Vital Sign    Days of Exercise per Week: 0 days    Minutes of Exercise per Session: Not on file  Stress: No Stress Concern Present (06/28/2021)   [employer] of Occupational Health - Occupational Stress Questionnaire    Feeling of Stress : Not at all  Social Connections: Unknown (06/28/2021)   Social Connection and Isolation Panel [NHANES]    Frequency of Communication with Friends and Family: More than three times a week    Frequency of Social Gatherings with Friends and Family: Not on file    Attends Religious Services: Not on file    Active Member of Clubs or Organizations: Not on file    Attends Banker Meetings: Not on file    Marital Status: Married     Review of Systems  Constitutional:  Negative for appetite change and unexpected weight change.   HENT:  Negative for congestion and sinus pressure.   Respiratory:  Negative for cough, chest tightness and shortness of breath.   Cardiovascular:  Negative for chest pain and palpitations.  Gastrointestinal:  Negative for abdominal pain, diarrhea, nausea and vomiting.  Genitourinary:  Negative for difficulty urinating and dysuria.  Musculoskeletal:  Negative for joint swelling and myalgias.  Skin:  Negative for color change and rash.  Neurological:  Negative for dizziness and headaches.  Psychiatric/Behavioral:  Negative for agitation and dysphoric mood.        Objective:     BP 110/68   Pulse 86   Temp 97.9 F (36.6 C)   Resp 16   Ht 5\' 3"  (1.6 m)   Wt 128 lb 6.4 oz (58.2 kg)   SpO2 98%   BMI 22.75 kg/m  Wt Readings from Last 3 Encounters:  05/25/22 128 lb 6.4 oz (58.2 kg)  01/24/22 132 lb (59.9 kg)  10/17/21 130 lb 12.8 oz (59.3 kg)    Physical Exam Vitals reviewed.  Constitutional:      General: She is not in acute distress.    Appearance: Normal appearance.  HENT:     Head: Normocephalic and atraumatic.     Right Ear: External ear normal.     Left Ear: External ear normal.  Eyes:     General: No scleral icterus.       Right eye: No discharge.        Left eye: No discharge.     Conjunctiva/sclera: Conjunctivae normal.  Neck:     Thyroid: No thyromegaly.  Cardiovascular:     Rate and Rhythm: Normal rate and regular rhythm.  Pulmonary:     Effort: No respiratory distress.     Breath sounds: Normal breath sounds. No wheezing.  Abdominal:     General: Bowel sounds are normal.     Palpations: Abdomen is soft.     Tenderness: There is no abdominal tenderness.  Musculoskeletal:        General: No swelling or tenderness.     Cervical back: Neck supple. No tenderness.  Lymphadenopathy:     Cervical: No cervical adenopathy.  Skin:    Findings: No erythema or rash.  Neurological:     Mental Status: She is alert.  Psychiatric:        Mood and Affect: Mood  normal.        Behavior: Behavior normal.      Outpatient Encounter Medications as of 05/25/2022  Medication Sig   ACCU-CHEK SOFTCLIX LANCETS lancets CHECK BLOOD SUGAR ONCE A DAY DX E11.9  aspirin 81 MG tablet Take 81 mg by mouth daily.   Blood Glucose Calibration (ACCU-CHEK GUIDE CONTROL) LIQD Used to check glucometer for accurateness.   calcium-vitamin D (OSCAL WITH D) 250-125 MG-UNIT tablet Take 1 tablet by mouth daily.   empagliflozin (JARDIANCE) 25 MG TABS tablet TAKE 1 TABLET BY MOUTH EVERY DAY BEFORE BREAKFAST   Evolocumab (REPATHA SURECLICK) 140 MG/ML SOAJ INJECT 140 MG INTO THE SKIN EVERY 14 (FOURTEEN) DAYS. (PA DENIED)   ezetimibe (ZETIA) 10 MG tablet TAKE 1 TABLET BY MOUTH EVERY DAY   glipiZIDE (GLUCOTROL XL) 5 MG 24 hr tablet Take 1 tablet (5 mg total) by mouth daily.   glucose blood (ACCU-CHEK AVIVA PLUS) test strip USE TO CHECK BLOOD SUGAR THREE TIMES A DAY   lisinopril (ZESTRIL) 10 MG tablet Take 1 tablet (10 mg total) by mouth daily.   Multiple Vitamin (MULTIVITAMIN) tablet Take 1 tablet by mouth daily.   omeprazole (PRILOSEC) 20 MG capsule Take 1 capsule (20 mg total) by mouth daily.   No facility-administered encounter medications on file as of 05/25/2022.     Lab Results  Component Value Date   WBC 8.5 05/22/2022   HGB 12.4 05/22/2022   HCT 38.5 05/22/2022   PLT 263.0 05/22/2022   GLUCOSE 115 (H) 05/22/2022   CHOL 106 05/22/2022   TRIG 65.0 05/22/2022   HDL 51.70 05/22/2022   LDLCALC 41 05/22/2022   ALT 14 05/22/2022   AST 17 05/22/2022   NA 138 05/22/2022   K 4.5 05/22/2022   CL 103 05/22/2022   CREATININE 0.92 05/22/2022   BUN 20 05/22/2022   CO2 26 05/22/2022   TSH 0.58 05/22/2022   HGBA1C 6.8 (H) 05/22/2022   MICROALBUR 3.7 (H) 07/08/2021    MM 3D SCREEN BREAST BILATERAL  Result Date: 08/17/2021 CLINICAL DATA:  Screening. EXAM: DIGITAL SCREENING BILATERAL MAMMOGRAM WITH TOMOSYNTHESIS AND CAD TECHNIQUE: Bilateral screening digital craniocaudal and  mediolateral oblique mammograms were obtained. Bilateral screening digital breast tomosynthesis was performed. The images were evaluated with computer-aided detection. COMPARISON:  Previous exam(s). ACR Breast Density Category b: There are scattered areas of fibroglandular density. FINDINGS: There are no findings suspicious for malignancy. IMPRESSION: No mammographic evidence of malignancy. A result letter of this screening mammogram will be mailed directly to the patient. RECOMMENDATION: Screening mammogram in one year. (Code:SM-B-01Y) BI-RADS CATEGORY  1: Negative. Electronically Signed   By: Baird Lyons M.D.   On: 08/17/2021 10:23       Assessment & Plan:  Anemia, unspecified type Assessment & Plan: Hgb just checked - improved 12.4.  follow.    Aortic atherosclerosis (HCC) Assessment & Plan: Continue repatha.    Hypercholesterolemia Assessment & Plan: Intolerant to statin medication.  On repatha. Will stop zetia. Cholesterol improved.  Low cholesterol diet and exercise.  Follow lipid panel.   Orders: -     Hepatic function panel; Future -     Lipid panel; Future  Primary hypertension Assessment & Plan: Continue lisinopril.  Blood pressure as outlined.   On jardiance 25mg  q day. Stay hydrated.   Follow pressures.  Follow metabolic panel.    Hypothyroidism, unspecified type Assessment & Plan: Evaluated by Dr Tedd Sias.  Recommended f/u in 12 months. Last evaluated 10/2021.  Follow tsh.    Numbness and tingling Assessment & Plan: Has had MRI and NCS - severe bilateral lower extremity polyneuropathy. Had trial of gabapentin.  Discussed trial of alpha lipoic acid.  Follow.    Statin myopathy Assessment & Plan: Intolerant to  statin medication.  On repatha.    Type 2 diabetes mellitus with hyperglycemia, without long-term current use of insulin (HCC) Assessment & Plan: Continue jardiance and glipizide. Low carb diet and exercise.  Follow met b and a1c. Recent A1c 6.8.    Orders: -     Hemoglobin A1c; Future -     Basic metabolic panel; Future -     Microalbumin / creatinine urine ratio; Future     Dale Bella Villa, MD

## 2022-05-29 ENCOUNTER — Encounter: Payer: Self-pay | Admitting: Internal Medicine

## 2022-05-29 NOTE — Assessment & Plan Note (Signed)
Continue lisinopril.  Blood pressure as outlined.   On jardiance 25mg q day. Stay hydrated.   Follow pressures.  Follow metabolic panel.  

## 2022-05-29 NOTE — Assessment & Plan Note (Signed)
Hgb just checked - improved 12.4.  follow.

## 2022-05-29 NOTE — Assessment & Plan Note (Addendum)
Intolerant to statin medication.  On repatha. Will stop zetia. Cholesterol improved.  Low cholesterol diet and exercise.  Follow lipid panel.

## 2022-05-29 NOTE — Assessment & Plan Note (Signed)
Continue repatha.  

## 2022-05-29 NOTE — Assessment & Plan Note (Signed)
Continue jardiance and glipizide. Low carb diet and exercise.  Follow met b and a1c. Recent A1c 6.8.

## 2022-05-29 NOTE — Assessment & Plan Note (Signed)
Intolerant to statin medication.  On repatha.

## 2022-05-29 NOTE — Assessment & Plan Note (Signed)
Has had MRI and NCS - severe bilateral lower extremity polyneuropathy. Had trial of gabapentin.  Discussed trial of alpha lipoic acid.  Follow.

## 2022-05-29 NOTE — Assessment & Plan Note (Addendum)
Evaluated by Dr Tedd Sias.  Recommended f/u in 12 months. Last evaluated 10/2021.  Follow tsh.

## 2022-06-30 ENCOUNTER — Ambulatory Visit (INDEPENDENT_AMBULATORY_CARE_PROVIDER_SITE_OTHER): Payer: Medicare Other

## 2022-06-30 VITALS — Ht 63.0 in | Wt 128.0 lb

## 2022-06-30 DIAGNOSIS — Z1231 Encounter for screening mammogram for malignant neoplasm of breast: Secondary | ICD-10-CM | POA: Diagnosis not present

## 2022-06-30 DIAGNOSIS — Z0000 Encounter for general adult medical examination without abnormal findings: Secondary | ICD-10-CM

## 2022-06-30 NOTE — Progress Notes (Signed)
You have an order for:  []   2D Mammogram  []   3D Mammogram  []   Bone Density     Please call for appointment:   [facility] [employer] Mammo Bus [phone]  [facility]  [address] [place] [address] [phone]  [facility] [address] [place], [place] [address] [phone]  [place] [facility] [address] Geanie Logan [place], [place] [address] [phone]  [facility] [address] [place], [place] [address] [phone]  [facility] [address] [place], [place] [address] [phone]  [facility] [place] [address] [place], [place] [address] [phone]  [facility] [place] [facility] [address] [place], [place] [address] [phone]  [facility] [address] [place], [place] [address] [phone]  [facility] at [facility] [address] [place], [place] [address] [phone]  [facility] [address] [place], [place] [address] [phone]  [facility] [place] [address] [place], [place] [address] [phone]  [facility] at [facility] [address] [place], [place] [address] [phone]  [facility] [place] [address] [place], [place] [address] [phone]  [facility] [address] [place], [place] [address] [phone] [facility]  [facility] at [facility] [address]iology [place], [place] [address] [phone]  [facility] [address], [place] [address] [phone]   Make sure to wear  two-piece clothing.  No lotions powders or deodorants the day of the appointment Make sure to bring picture ID and insurance card.  Bring list of medications you are currently taking including any supplements.   Schedule your [place] screening mammogram through MyChart!   Log into your MyChart account.  Go to 'Visit' (or 'Appointments' if on mobile App) --> Schedule an Appointment  Under 'Select a Reason for Visit' choose the Mammogram Screening option.  Complete the pre-visit questions and select the time and place that best fits your schedule.     Subjective:   Wanda Ruiz is a 87 y.o. female who presents for Medicare Annual (Subsequent) preventive examination.  Visit Complete: Virtual  I connected with  Wanda Ruiz on 06/30/22 by a audio enabled telemedicine application and verified that I am speaking with the correct person using two identifiers.  Patient Location: Home  Provider Location: Home Office  I discussed the limitations of evaluation and management by telemedicine. The patient expressed understanding and agreed to proceed.  Review of Systems     Cardiac Risk Factors include: advanced age (>36men, >44 women);diabetes mellitus;hypertension     Objective:    Today's Vitals   06/30/22 1154  Weight: 128 lb (58.1 kg)  Height: 5\' 3"  (1.6 m)   Body mass index is 22.67 kg/m.     06/30/2022   12:08 PM 06/28/2021   11:39 AM 06/23/2020    3:43 PM 06/23/2019    8:52 AM 02/21/2019    3:30 PM 06/13/2018   10:47 AM 11/09/2017    7:08 AM  Advanced Directives  Does Patient Have a Medical Advance Directive? No Yes Yes Yes No Yes No  Type of Advance Directive  Healthcare Power of  Attorney;Living will Healthcare Power of [place];Living will Healthcare Power of [place];Living will     Does patient want to make changes to medical advance directive?  No - Patient declined No - Patient declined No - Patient declined  No - Patient declined   Copy of Healthcare Power  of Attorney in Chart?  No - copy requested No - copy requested No - copy requested     Would patient like information on creating a medical advance directive? Yes (MAU/Ambulatory/Procedural Areas - Information given)    No - Patient declined      Current Medications (verified) Outpatient Encounter Medications as of 06/30/2022  Medication Sig   ACCU-CHEK SOFTCLIX LANCETS lancets CHECK BLOOD SUGAR ONCE A DAY DX E11.9   aspirin 81 MG tablet Take 81 mg by mouth daily.   Blood Glucose Calibration (ACCU-CHEK GUIDE CONTROL) LIQD Used to check glucometer for accurateness.   calcium-vitamin D (OSCAL WITH D) 250-125 MG-UNIT tablet Take 1 tablet by mouth daily.   empagliflozin (JARDIANCE) 25 MG TABS tablet TAKE 1 TABLET BY MOUTH EVERY DAY BEFORE BREAKFAST   Evolocumab (REPATHA SURECLICK) 140 MG/ML SOAJ INJECT 140 MG INTO THE SKIN EVERY 14 (FOURTEEN) DAYS. (PA DENIED)   ezetimibe (ZETIA) 10 MG tablet TAKE 1 TABLET BY MOUTH EVERY DAY   glipiZIDE (GLUCOTROL XL) 5 MG 24 hr tablet Take 1 tablet (5 mg total) by mouth daily.   glucose blood (ACCU-CHEK AVIVA PLUS) test strip USE TO CHECK BLOOD SUGAR THREE TIMES A DAY   lisinopril (ZESTRIL) 10 MG tablet Take 1 tablet (10 mg total) by mouth daily.   Multiple Vitamin (MULTIVITAMIN) tablet Take 1 tablet by mouth daily.   omeprazole (PRILOSEC) 20 MG capsule Take 1 capsule (20 mg total) by mouth daily.   No facility-administered encounter medications on file as of 06/30/2022.    Allergies (verified) Patient has no known allergies.   History: Past Medical History:  Diagnosis Date   Anemia    Diabetes mellitus (HCC)    GERD (gastroesophageal reflux disease)    Hypercholesterolemia    Hypertension    Hypothyroidism    Osteoporosis    Past Surgical History:  Procedure Laterality Date   COLONOSCOPY     COLONOSCOPY WITH PROPOFOL N/A 11/09/2017   Procedure: COLONOSCOPY WITH PROPOFOL;  Surgeon: Christena Deem, MD;  Location[facility]COPY;  Service:  Endoscopy;  Laterality: N/A;   CYST EXCISION     low back   ESOPHAGOGASTRODUODENOSCOPY (EGD) WITH PROPOFOL N/A 11/09/2017   Procedure: ESOPHAGOGASTRODUODENOSCOPY (EGD) WITH PROPOFOL;  Surgeon: Christena Deem, MD;  Location[facility] ENDOSCOPY;  Service: Endoscopy;  Laterality: N/A;   THYROID SURGERY  1992   left hemi-thyroidectomy (colloid goiter0   UPPER GASTROINTESTINAL ENDOSCOPY     Family History  Problem Relation Age of Onset   Emphysema Father    Heart disease Mother        myocardial infarction (51)   Hypertension Mother    Diabetes Mother    Heart disease Brother        drug use   Diabetes Mellitus II Sister        x3   Hypertension Sister    Breast cancer Other        niece   Breast cancer Sister    Cancer Sister    Colon cancer Neg Hx    Social History   Socioeconomic History   Marital status: Married    Spouse name: Not on file   Number of children: 2  Years of education: Not on file   Highest education level: Not on file  Occupational History   Not on file  Tobacco Use   Smoking status: Never   Smokeless tobacco: Never  Vaping Use   Vaping Use: Never used  Substance and Sexual Activity   Alcohol use: No    Alcohol/week: 0.0 standard drinks of alcohol    Comment: occasional   Drug use: Never   Sexual activity: Not on file  Other Topics Concern   Not on file  Social History Narrative   In [place]; with husband; never smoked;  no alcohol; telephone receptionist.    Social Determinants of Health   Financial Resource Strain: Low Risk  (06/30/2022)   Overall Financial Resource Strain (CARDIA)    Difficulty of Paying Living Expenses: Not hard at all  Food Insecurity: No Food Insecurity (06/30/2022)   Hunger Vital Sign    Worried About Running Out of Food in the Last Year: Never true    Ran Out of Food in the Last Year: Never true  Transportation Needs: No Transportation Needs (06/30/2022)   PRAPARE - Administrator, Civil Service  (Medical): No    Lack of Transportation (Non-Medical): No  Physical Activity: Insufficiently Active (06/30/2022)   Exercise Vital Sign    Days of Exercise per Week: 3 days    Minutes of Exercise per Session: 30 min  Stress: No Stress Concern Present (06/30/2022)   [employer] of Occupational Health - Occupational Stress Questionnaire    Feeling of Stress : Not at all  Social Connections: Socially Integrated (06/30/2022)   Social Connection and Isolation Panel [NHANES]    Frequency of Communication with Friends and Family: More than three times a week    Frequency of Social Gatherings with Friends and Family: Three times a week    Attends Religious Services: More than 4 times per year    Active Member of Clubs or Organizations: Yes    Attends Engineer, structural: More than 4 times per year    Marital Status: Married    Tobacco Counseling Counseling given: Not Answered   Clinical Intake:  Pre-visit preparation completed: Yes  Pain : No/denies pain     Diabetes: Yes CBG done?: No Did pt. bring in CBG monitor from home?: No  How often do you need to have someone help you when you read instructions, pamphlets, or other written materials from your doctor or pharmacy?: 1 - Never  Interpreter Needed?: No  Information entered by :: Kandis Fantasia LPN   Activities of Daily Living    06/30/2022   12:08 PM  In your present state of health, do you have any difficulty performing the following activities:  Hearing? 0  Vision? 0  Difficulty concentrating or making decisions? 0  Walking or climbing stairs? 0  Dressing or bathing? 0  Doing errands, shopping? 0  Preparing Food and eating ? N  Using the Toilet? N  In the past six months, have you accidently leaked urine? N  Do you have problems with loss of bowel control? N  Managing your Medications? N  Managing your Finances? N  Housekeeping or managing your Housekeeping? N    Patient Care Team: Dale Allen, MD as PCP - General (Internal Medicine) [facility], [facility])  Indicate any recent Medical Services you may have received from other than [facility] providers in the past year (date may be approximate).     Assessment:   This  is a routine wellness examination for Wanda Ruiz.  Hearing/Vision screen Hearing Screening - Comments:: Denies hearing difficulties   Vision Screening - Comments:: Wears rx glasses - up to date with routine eye exams with [facility]      Dietary issues and exercise activities discussed:     Goals Addressed               This Visit's Progress     COMPLETED: Maintain Healthy Lifestyle (pt-stated)        Stay active Healthy diet      Remain active and independent        Depression Screen    06/30/2022   12:06 PM 01/24/2022    1:29 PM 10/17/2021    1:24 PM 07/13/2021   11:43 AM 06/28/2021   11:38 AM 06/21/2021   10:09 AM 12/23/2020   11:50 AM  PHQ 2/9 Scores  PHQ - 2 Score 0 0 0 0 0 0 0    Fall Risk    06/30/2022   12:07 PM 01/24/2022    1:29 PM 10/17/2021    1:24 PM 07/13/2021   11:43 AM 06/28/2021   11:39 AM  Fall Risk   Falls in the past year? 0 0 0 0 0  Number falls in past yr: 0 0   0  Injury with Fall? 0 0     Risk for fall due to : No Fall Risks No Fall Risks No Fall Risks No Fall Risks   Follow up Falls prevention discussed;Education provided;Falls evaluation completed Falls evaluation completed Falls evaluation completed Falls evaluation completed Falls evaluation completed    MEDICARE RISK AT HOME:  Medicare Risk at Home - 06/30/22 1207     Any stairs in or around the home? No    If so, are there any without handrails? No    Home free of loose throw rugs in walkways, pet beds, electrical cords, etc? Yes    Adequate lighting in your home to reduce risk of falls? Yes    Life alert? No    Use of a cane, walker or w/c? No    Grab bars in the bathroom? Yes    Shower chair or bench in shower? No    Elevated  toilet seat or a handicapped toilet? Yes             TIMED UP AND GO:  Was the test performed?  No    Cognitive Function:    06/09/2016    1:49 PM  MMSE - Mini Mental State Exam  Orientation to time 5  Orientation to Place 5  Registration 3  Attention/ Calculation 5  Recall 3  Language- name 2 objects 2  Language- repeat 1  Language- follow 3 step command 3  Language- read & follow direction 1  Write a sentence 1  Copy design 1  Total score 30        06/30/2022   12:08 PM 06/23/2019    8:52 AM 06/13/2018   10:48 AM 06/11/2017    1:47 PM  6CIT Screen  What Year? 0 points 0 points 0 points 0 points  What month? 0 points 0 points 0 points 0 points  What time? 0 points  0 points 0 points  Count back from 20 0 points  0 points 0 points  Months in reverse 0 points 0 points 0 points 0 points  Repeat phrase 0 points  0 points 0 points  Total Score 0 points  0  points 0 points    Immunizations Immunization History  Administered Date(s) Administered   COVID-19, mRNA, vaccine(Comirnaty)12 years and older 11/01/2021   Fluad Quad(high Dose 65+) 09/10/2018, 10/09/2019, 10/13/2020, 10/17/2021   Influenza, High Dose Seasonal PF 10/22/2015, 09/19/2016, 11/22/2017   Influenza,inj,Quad PF,6+ Mos 10/13/2013, 10/15/2014   PFIZER Comirnaty(Gray Top)Covid-19 Tri-Sucrose Vaccine 04/20/2020   PFIZER(Purple Top)SARS-COV-2 Vaccination 02/07/2019, 02/28/2019, 10/04/2019   Pfizer Covid-19 Vaccine Bivalent Booster 41yrs & up 10/16/2020   Pneumococcal Conjugate-13 02/13/2014   Pneumococcal Polysaccharide-23 01/25/2017   Zoster Recombinat (Shingrix) 10/23/2019, 12/22/2019    TDAP status: Due, Education has been provided regarding the importance of this vaccine. Advised may receive this vaccine at local pharmacy or Health Dept. Aware to provide a copy of the vaccination record if obtained from local pharmacy or Health Dept. Verbalized acceptance and understanding.  Pneumococcal vaccine status:  Up to date  Covid-19 vaccine status: Completed vaccines  Qualifies for Shingles Vaccine? Yes   Zostavax completed No   Shingrix Completed?: Yes  Screening Tests Health Maintenance  Topic Date Due   FOOT EXAM  12/23/2021   Diabetic kidney evaluation - Urine ACR  07/09/2022   INFLUENZA VACCINE  08/10/2022   MAMMOGRAM  08/17/2022   HEMOGLOBIN A1C  11/22/2022   OPHTHALMOLOGY EXAM  03/16/2023   Diabetic kidney evaluation - eGFR measurement  05/22/2023   Medicare Annual Wellness (AWV)  06/30/2023   Pneumonia Vaccine 48+ Years old  Completed   DEXA SCAN  Completed   COVID-19 Vaccine  Completed   Zoster Vaccines- Shingrix  Completed   HPV VACCINES  Aged Out   DTaP/Tdap/Td  Discontinued    Health Maintenance  Health Maintenance Due  Topic Date Due   FOOT EXAM  12/23/2021   Diabetic kidney evaluation - Urine ACR  07/09/2022    Colorectal cancer screening: No longer required.   Mammogram status: Ordered today; last 08/16/21. Pt provided with contact info and advised to call to schedule appt.   Bone Density status: Completed 02/14/21. Results reflect: Bone density results: OSTEOPENIA. Repeat every 2 years.  Lung Cancer Screening: (Low Dose CT Chest recommended if Age 58-80 years, 20 pack-year currently smoking OR have quit w/in 15years.) does not qualify.   Lung Cancer Screening Referral: n/a  Additional Screening:  Hepatitis C Screening: does not qualify  Vision Screening: Recommended annual ophthalmology exams for early detection of glaucoma and other disorders of the eye. Is the patient up to date with their annual eye exam?  Yes  Who is the provider or what is the name of the office in which the patient attends annual eye exams? [facility] If pt is not established with a provider, would they like to be referred to a provider to establish care? No .   Dental Screening: Recommended annual dental exams for proper oral hygiene  Diabetic Foot Exam: Diabetic Foot Exam:  Overdue, Pt has been advised about the importance in completing this exam. Pt is scheduled for diabetic foot exam on at next office visit.  Community Resource Referral / Chronic Care Management: CRR required this visit?  No   CCM required this visit?  No     Plan:     I have personally reviewed and noted the following in the patient's chart:   Medical and social history Use of alcohol, tobacco or illicit drugs  Current medications and supplements including opioid prescriptions. Patient is not currently taking opioid prescriptions. Functional ability and status Nutritional status Physical activity Advanced directives List of other physicians  Hospitalizations, surgeries, and ER visits in previous 12 months Vitals Screenings to include cognitive, depression, and falls Referrals and appointments  In addition, I have reviewed and discussed with patient certain preventive protocols, quality metrics, and best practice recommendations. A written personalized care plan for preventive services as well as general preventive health recommendations were provided to patient.     Kandis Fantasia [place], [place]   1/61/0960   After Visit Summary: (Mail) Due to this being a telephonic visit, the after visit summary with patients personalized plan was offered to patient via mail   Nurse Notes: No concerns

## 2022-06-30 NOTE — Patient Instructions (Signed)
Wanda Ruiz , Thank you for taking time to come for your Medicare Wellness Visit. I appreciate your ongoing commitment to your health goals. Please review the following plan we discussed and let me know if I can assist you in the future.   These are the goals we discussed:  Goals      Remain active and independent        This is a list of the screening recommended for you and due dates:  Health Maintenance  Topic Date Due   Complete foot exam   12/23/2021   Yearly kidney health urinalysis for diabetes  07/09/2022   Flu Shot  08/10/2022   Mammogram  08/17/2022   Hemoglobin A1C  11/22/2022   Eye exam for diabetics  03/16/2023   Yearly kidney function blood test for diabetes  05/22/2023   Medicare Annual Wellness Visit  06/30/2023   Pneumonia Vaccine  Completed   DEXA scan (bone density measurement)  Completed   COVID-19 Vaccine  Completed   Zoster (Shingles) Vaccine  Completed   HPV Vaccine  Aged Out   DTaP/Tdap/Td vaccine  Discontinued    Advanced directives: Information on Advanced Care Planning can be found at [facility] of [facility] Advance Health Care Directives Advance Health Care Directives ([email or web address] Please bring a copy of your health care power of attorney and living will to the office to be added to your chart at your convenience.  Conditions/risks identified: Aim for 30 minutes of exercise or brisk walking, 6-8 glasses of water, and 5 servings of fruits and vegetables each day.  Next appointment: Follow up in one year for your annual wellness visit   You have an order for:  []   2D Mammogram  [x]   3D Mammogram  []   Bone Density     Please call for appointment:   [facility]  [address] [place] [address] [phone]  Make sure to wear two-piece clothing.  No lotions powders or deodorants the day of the appointment Make sure to bring picture ID and insurance card.  Bring list of medications  you are currently taking including any supplements.   Schedule your [place] screening mammogram through MyChart!   Log into your MyChart account.  Go to 'Visit' (or 'Appointments' if on mobile App) --> Schedule an Appointment  Under 'Select a Reason for Visit' choose the Mammogram Screening option.  Complete the pre-visit questions and select the time and place that best fits your schedule.     Preventive Care 53 Years and Older, Female Preventive care refers to lifestyle choices and visits with your health care provider that can promote health and wellness. What does preventive care include? A yearly physical exam. This is also called an annual well check. Dental exams once or twice a year. Routine eye exams. Ask your health care provider how often you should have your eyes checked. Personal lifestyle choices, including: Daily care of your teeth and gums. Regular physical activity. Eating a healthy diet. Avoiding tobacco and drug use. Limiting alcohol use. Practicing safe sex. Taking low-dose aspirin every day. Taking vitamin and mineral supplements as recommended by your health care provider. What happens during an annual well check? The services and screenings done by your health care provider during your annual well check will depend on your age, overall health, lifestyle risk factors, and family history of disease. Counseling  Your health care provider may ask you questions about your: Alcohol use. Tobacco  use. Drug use. Emotional well-being. Home and relationship well-being. Sexual activity. Eating habits. History of falls. Memory and ability to understand (cognition). Work and work Astronomer. Reproductive health. Screening  You may have the following tests or measurements: Height, weight, and BMI. Blood pressure. Lipid and cholesterol levels. These may be checked every 5 years, or more frequently if you are over 67 years old. Skin check. Lung cancer  screening. You may have this screening every year starting at age 76 if you have a 30-pack-year history of smoking and currently smoke or have quit within the past 15 years. Fecal occult blood test (FOBT) of the stool. You may have this test every year starting at age 18. Flexible sigmoidoscopy or colonoscopy. You may have a sigmoidoscopy every 5 years or a colonoscopy every 10 years starting at age 62. Hepatitis C blood test. Hepatitis B blood test. Sexually transmitted disease (STD) testing. Diabetes screening. This is done by checking your blood sugar (glucose) after you have not eaten for a while (fasting). You may have this done every 1-3 years. Bone density scan. This is done to screen for osteoporosis. You may have this done starting at age 65. Mammogram. This may be done every 1-2 years. Talk to your health care provider about how often you should have regular mammograms. Talk with your health care provider about your test results, treatment options, and if necessary, the need for more tests. Vaccines  Your health care provider may recommend certain vaccines, such as: Influenza vaccine. This is recommended every year. Tetanus, diphtheria, and acellular pertussis (Tdap, Td) vaccine. You may need a Td booster every 10 years. Zoster vaccine. You may need this after age 30. Pneumococcal 13-valent conjugate (PCV13) vaccine. One dose is recommended after age 45. Pneumococcal polysaccharide (PPSV23) vaccine. One dose is recommended after age 17. Talk to your health care provider about which screenings and vaccines you need and how often you need them. This information is not intended to replace advice given to you by your health care provider. Make sure you discuss any questions you have with your health care provider. Document Released: 01/22/2015 Document Revised: 09/15/2015 Document Reviewed: 10/27/2014 Elsevier Interactive Patient Education  2017 [employer].  Fall Prevention in the  Home Falls can cause injuries. They can happen to people of all ages. There are many things you can do to make your home safe and to help prevent falls. What can I do on the outside of my home? Regularly fix the edges of walkways and driveways and fix any cracks. Remove anything that might make you trip as you walk through a door, such as a raised step or threshold. Trim any bushes or trees on the path to your home. Use bright outdoor lighting. Clear any walking paths of anything that might make someone trip, such as rocks or tools. Regularly check to see if handrails are loose or broken. Make sure that both sides of any steps have handrails. Any raised decks and porches should have guardrails on the edges. Have any leaves, snow, or ice cleared regularly. Use sand or salt on walking paths during winter. Clean up any spills in your garage right away. This includes oil or grease spills. What can I do in the bathroom? Use night lights. Install grab bars by the toilet and in the tub and shower. Do not use towel bars as grab bars. Use non-skid mats or decals in the tub or shower. If you need to sit down in the shower, use a  plastic, non-slip stool. Keep the floor dry. Clean up any water that spills on the floor as soon as it happens. Remove soap buildup in the tub or shower regularly. Attach bath mats securely with double-sided non-slip rug tape. Do not have throw rugs and other things on the floor that can make you trip. What can I do in the bedroom? Use night lights. Make sure that you have a light by your bed that is easy to reach. Do not use any sheets or blankets that are too big for your bed. They should not hang down onto the floor. Have a firm chair that has side arms. You can use this for support while you get dressed. Do not have throw rugs and other things on the floor that can make you trip. What can I do in the kitchen? Clean up any spills right away. Avoid walking on wet  floors. Keep items that you use a lot in easy-to-reach places. If you need to reach something above you, use a strong step stool that has a grab bar. Keep electrical cords out of the way. Do not use floor polish or wax that makes floors slippery. If you must use wax, use non-skid floor wax. Do not have throw rugs and other things on the floor that can make you trip. What can I do with my stairs? Do not leave any items on the stairs. Make sure that there are handrails on both sides of the stairs and use them. Fix handrails that are broken or loose. Make sure that handrails are as long as the stairways. Check any carpeting to make sure that it is firmly attached to the stairs. Fix any carpet that is loose or worn. Avoid having throw rugs at the top or bottom of the stairs. If you do have throw rugs, attach them to the floor with carpet tape. Make sure that you have a light switch at the top of the stairs and the bottom of the stairs. If you do not have them, ask someone to add them for you. What else can I do to help prevent falls? Wear shoes that: Do not have high heels. Have rubber bottoms. Are comfortable and fit you well. Are closed at the toe. Do not wear sandals. If you use a stepladder: Make sure that it is fully opened. Do not climb a closed stepladder. Make sure that both sides of the stepladder are locked into place. Ask someone to hold it for you, if possible. Clearly mark and make sure that you can see: Any grab bars or handrails. First and last steps. Where the edge of each step is. Use tools that help you move around (mobility aids) if they are needed. These include: Canes. Walkers. Scooters. Crutches. Turn on the lights when you go into a dark area. Replace any light bulbs as soon as they burn out. Set up your furniture so you have a clear path. Avoid moving your furniture around. If any of your floors are uneven, fix them. If there are any pets around you, be aware of  where they are. Review your medicines with your doctor. Some medicines can make you feel dizzy. This can increase your chance of falling. Ask your doctor what other things that you can do to help prevent falls. This information is not intended to replace advice given to you by your health care provider. Make sure you discuss any questions you have with your health care provider. Document Released: 10/22/2008 Document Revised: 06/03/2015  Document Reviewed: 01/30/2014 Elsevier Interactive Patient Education  2017 [employer].

## 2022-08-18 ENCOUNTER — Ambulatory Visit
Admission: RE | Admit: 2022-08-18 | Discharge: 2022-08-18 | Disposition: A | Discharge status: Home / Self Care | Payer: Medicare Other | Source: Ambulatory Visit | Attending: Internal Medicine | Admitting: Internal Medicine

## 2022-08-18 DIAGNOSIS — Z1231 Encounter for screening mammogram for malignant neoplasm of breast: Secondary | ICD-10-CM | POA: Diagnosis present

## 2022-08-24 ENCOUNTER — Encounter (INDEPENDENT_AMBULATORY_CARE_PROVIDER_SITE_OTHER): Payer: Self-pay

## 2022-09-08 ENCOUNTER — Other Ambulatory Visit: Payer: Self-pay | Admitting: Internal Medicine

## 2022-09-22 LAB — HM DIABETES EYE EXAM

## 2022-09-25 ENCOUNTER — Other Ambulatory Visit (INDEPENDENT_AMBULATORY_CARE_PROVIDER_SITE_OTHER): Payer: Medicare Other

## 2022-09-25 DIAGNOSIS — E1165 Type 2 diabetes mellitus with hyperglycemia: Secondary | ICD-10-CM

## 2022-09-25 DIAGNOSIS — E7800 Pure hypercholesterolemia, unspecified: Secondary | ICD-10-CM | POA: Diagnosis not present

## 2022-09-25 LAB — BASIC METABOLIC PANEL
BUN: 20 mg/dL (ref 6–23)
CO2: 28 meq/L (ref 19–32)
Calcium: 9.4 mg/dL (ref 8.4–10.5)
Chloride: 102 meq/L (ref 96–112)
Creatinine, Ser: 0.99 mg/dL (ref 0.40–1.20)
GFR: 52.01 mL/min — ABNORMAL LOW (ref >60.00)
Glucose, Bld: 113 mg/dL — ABNORMAL HIGH (ref 70–99)
Potassium: 4.1 meq/L (ref 3.5–5.1)
Sodium: 137 meq/L (ref 135–145)

## 2022-09-25 LAB — MICROALBUMIN / CREATININE URINE RATIO
Creatinine,U: 72.7 mg/dL
Microalb Creat Ratio: 4.1 mg/g (ref 0.0–30.0)
Microalb, Ur: 3 mg/dL — ABNORMAL HIGH (ref 0.0–1.9)

## 2022-09-25 LAB — HEMOGLOBIN A1C: Hgb A1c MFr Bld: 6.6 % — ABNORMAL HIGH (ref 4.6–6.5)

## 2022-09-25 LAB — HEPATIC FUNCTION PANEL
ALT: 11 U/L (ref 0–35)
AST: 15 U/L (ref 0–37)
Albumin: 4.1 g/dL (ref 3.5–5.2)
Alkaline Phosphatase: 50 U/L (ref 39–117)
Bilirubin, Direct: 0.1 mg/dL (ref 0.0–0.3)
Total Bilirubin: 0.5 mg/dL (ref 0.2–1.2)
Total Protein: 7.6 g/dL (ref 6.0–8.3)

## 2022-09-25 LAB — LIPID PANEL
Cholesterol: 151 mg/dL (ref 0–200)
HDL: 57.2 mg/dL (ref >39.00)
LDL Cholesterol: 81 mg/dL (ref 0–99)
NonHDL: 93.58
Total CHOL/HDL Ratio: 3
Triglycerides: 61 mg/dL (ref 0.0–149.0)
VLDL: 12.2 mg/dL (ref 0.0–40.0)

## 2022-09-27 ENCOUNTER — Ambulatory Visit: Payer: Medicare Other | Admitting: Internal Medicine

## 2022-09-27 ENCOUNTER — Encounter: Payer: Self-pay | Admitting: Internal Medicine

## 2022-09-27 VITALS — BP 112/68 | HR 74 | Temp 98.9°F | Ht 63.0 in | Wt 130.4 lb

## 2022-09-27 DIAGNOSIS — Z7984 Long term (current) use of oral hypoglycemic drugs: Secondary | ICD-10-CM

## 2022-09-27 DIAGNOSIS — E7800 Pure hypercholesterolemia, unspecified: Secondary | ICD-10-CM | POA: Diagnosis not present

## 2022-09-27 DIAGNOSIS — Z23 Encounter for immunization: Secondary | ICD-10-CM

## 2022-09-27 DIAGNOSIS — G720 Drug-induced myopathy: Secondary | ICD-10-CM

## 2022-09-27 DIAGNOSIS — M79604 Pain in right leg: Secondary | ICD-10-CM

## 2022-09-27 DIAGNOSIS — I700 Atherosclerosis of aorta: Secondary | ICD-10-CM | POA: Diagnosis not present

## 2022-09-27 DIAGNOSIS — T466X5A Adverse effect of antihyperlipidemic and antiarteriosclerotic drugs, initial encounter: Secondary | ICD-10-CM

## 2022-09-27 DIAGNOSIS — E1165 Type 2 diabetes mellitus with hyperglycemia: Secondary | ICD-10-CM

## 2022-09-27 DIAGNOSIS — D649 Anemia, unspecified: Secondary | ICD-10-CM | POA: Diagnosis not present

## 2022-09-27 DIAGNOSIS — I10 Essential (primary) hypertension: Secondary | ICD-10-CM

## 2022-09-27 DIAGNOSIS — E039 Hypothyroidism, unspecified: Secondary | ICD-10-CM

## 2022-09-27 NOTE — Progress Notes (Signed)
Subjective:    [name], female    DOB: February 22, 1935, 87 y.o.   MRN: [id number]  Patient here for  Chief Complaint  Patient presents with   Medication Management    HPI Here to follow up regarding hypercholesterolemia (on repatha), hypertension and diabetes. Continues on jardiance and glipizide. On repatha for cholesterol. A1c recent check 6.6.  discussed recent labs.  Reports she is doing relatively well.  Main complaint is soreness - legs - mostly lateral right lower leg/right calf.  Feels heavy.  Also pain in buttock.  Has had previous MRI and NCS.  Has tried gabapentin for neuropathy.  Persistent pain - right lower leg - into calf.    Past Medical History:  Diagnosis Date   Anemia    Diabetes mellitus (HCC)    GERD (gastroesophageal reflux disease)    Hypercholesterolemia    Hypertension    Hypothyroidism    Osteoporosis    Past Surgical History:  Procedure Laterality Date   COLONOSCOPY     COLONOSCOPY WITH PROPOFOL N/A 11/09/2017   Procedure: COLONOSCOPY WITH PROPOFOL;  Surgeon: Christena Deem, MD;  [facility] ENDOSCOPY;  Service: Endoscopy;  Laterality: N/A;   CYST EXCISION     low back   ESOPHAGOGASTRODUODENOSCOPY (EGD) WITH PROPOFOL N/A 11/09/2017   Procedure: ESOPHAGOGASTRODUODENOSCOPY (EGD) WITH PROPOFOL;  Surgeon: Christena Deem, MD;  Location: [facility] ENDOSCOPY;  Service: Endoscopy;  Laterality: N/A;   THYROID SURGERY  1992   left hemi-thyroidectomy (colloid goiter0   UPPER GASTROINTESTINAL ENDOSCOPY     Family History  Problem Relation Age of Onset   Emphysema Father    Heart disease Mother        myocardial infarction (55)   Hypertension Mother    Diabetes Mother    Heart disease Brother        drug use   Diabetes Mellitus II Sister        x3   Hypertension Sister    Breast cancer Other        niece   Breast cancer Sister    Cancer Sister    Colon cancer Neg Hx    Social History   Socioeconomic History   Marital status:  Married    Spouse name: Not on file   Number of children: 2   Years of education: Not on file   Highest education level: Not on file  Occupational History   Not on file  Tobacco Use   Smoking status: Never   Smokeless tobacco: Never  Vaping Use   Vaping status: Never Used  Substance and Sexual Activity   Alcohol use: No    Alcohol/week: 0.0 standard drinks of alcohol    Comment: occasional   Drug use: Never   Sexual activity: Not on file  Other Topics Concern   Not on file  Social History Narrative   In [place]; with husband; never smoked;  no alcohol; telephone receptionist.    Social Determinants of Health   Financial Resource Strain: Low Risk  (06/30/2022)   Overall Financial Resource Strain (CARDIA)    Difficulty of Paying Living Expenses: Not hard at all  Food Insecurity: No Food Insecurity (06/30/2022)   Hunger Vital Sign    Worried About Running Out of Food in the Last Year: Never true    Ran Out of Food in the Last Year: Never true  Transportation Needs: No Transportation Needs (06/30/2022)   PRAPARE - Administrator, Civil Service (Medical): No  Lack of Transportation (Non-Medical): No  Physical Activity: Insufficiently Active (06/30/2022)   Exercise Vital Sign    Days of Exercise per Week: 3 days    Minutes of Exercise per Session: 30 min  Stress: No Stress Concern Present (06/30/2022)   [employer] of Occupational Health - Occupational Stress Questionnaire    Feeling of Stress : Not at all  Social Connections: Socially Integrated (06/30/2022)   Social Connection and Isolation Panel [NHANES]    Frequency of Communication with Friends and Family: More than three times a week    Frequency of Social Gatherings with Friends and Family: Three times a week    Attends Religious Services: More than 4 times per year    Active Member of Clubs or Organizations: Yes    Attends Banker Meetings: More than 4 times per year    Marital Status:  Married     Review of Systems  Constitutional:  Negative for appetite change and unexpected weight change.  HENT:  Negative for congestion and sinus pressure.   Respiratory:  Negative for cough, chest tightness and shortness of breath.   Cardiovascular:  Negative for chest pain and palpitations.  Gastrointestinal:  Negative for abdominal pain, diarrhea, nausea and vomiting.  Genitourinary:  Negative for difficulty urinating and dysuria.  Musculoskeletal:  Negative for joint swelling and myalgias.       Right lower leg/calf pain as outlined.   Skin:  Negative for color change and rash.  Neurological:  Negative for dizziness and headaches.  Psychiatric/Behavioral:  Negative for agitation and dysphoric mood.        Objective:     BP 112/68   Pulse 74   Temp 98.9 F (37.2 C) (Oral)   Ht 5\' 3"  (1.6 m)   Wt 130 lb 6.4 oz (59.1 kg)   SpO2 97%   BMI 23.10 kg/m  Wt Readings from Last 3 Encounters:  09/27/22 130 lb 6.4 oz (59.1 kg)  06/30/22 128 lb (58.1 kg)  05/25/22 128 lb 6.4 oz (58.2 kg)    Physical Exam Vitals reviewed.  Constitutional:      General: She is not in acute distress.    Appearance: Normal appearance.  HENT:     Head: Normocephalic and atraumatic.     Right Ear: External ear normal.     Left Ear: External ear normal.  Eyes:     General: No scleral icterus.       Right eye: No discharge.        Left eye: No discharge.     Conjunctiva/sclera: Conjunctivae normal.  Neck:     Thyroid: No thyromegaly.  Cardiovascular:     Rate and Rhythm: Normal rate and regular rhythm.  Pulmonary:     Effort: No respiratory distress.     Breath sounds: Normal breath sounds. No wheezing.  Abdominal:     General: Bowel sounds are normal.     Palpations: Abdomen is soft.     Tenderness: There is no abdominal tenderness.  Musculoskeletal:        General: No swelling or tenderness.     Cervical back: Neck supple. No tenderness.     Comments: DP pulses palpable and equal  bilaterally.   Lymphadenopathy:     Cervical: No cervical adenopathy.  Skin:    Findings: No erythema or rash.  Neurological:     Mental Status: She is alert.  Psychiatric:        Mood and Affect: Mood normal.  Behavior: Behavior normal.      Outpatient Encounter Medications as of 09/27/2022  Medication Sig   ACCU-CHEK SOFTCLIX LANCETS lancets CHECK BLOOD SUGAR ONCE A DAY DX E11.9   aspirin 81 MG tablet Take 81 mg by mouth daily.   Blood Glucose Calibration (ACCU-CHEK GUIDE CONTROL) LIQD Used to check glucometer for accurateness.   calcium-vitamin D (OSCAL WITH D) 250-125 MG-UNIT tablet Take 1 tablet by mouth daily.   empagliflozin (JARDIANCE) 25 MG TABS tablet TAKE 1 TABLET BY MOUTH EVERY DAY BEFORE BREAKFAST   Evolocumab (REPATHA SURECLICK) 140 MG/ML SOAJ INJECT 140 MG INTO THE SKIN EVERY 14 (FOURTEEN) DAYS. (PA DENIED)   ezetimibe (ZETIA) 10 MG tablet TAKE 1 TABLET BY MOUTH EVERY DAY   glipiZIDE (GLUCOTROL XL) 5 MG 24 hr tablet Take 1 tablet (5 mg total) by mouth daily.   glucose blood (ACCU-CHEK AVIVA PLUS) test strip USE TO CHECK BLOOD SUGAR THREE TIMES A DAY   lisinopril (ZESTRIL) 10 MG tablet Take 1 tablet (10 mg total) by mouth daily.   Multiple Vitamin (MULTIVITAMIN) tablet Take 1 tablet by mouth daily.   omeprazole (PRILOSEC) 20 MG capsule Take 1 capsule (20 mg total) by mouth daily.   No facility-administered encounter medications on file as of 09/27/2022.     Lab Results  Component Value Date   WBC 8.5 05/22/2022   HGB 12.4 05/22/2022   HCT 38.5 05/22/2022   PLT 263.0 05/22/2022   GLUCOSE 113 (H) 09/25/2022   CHOL 151 09/25/2022   TRIG 61.0 09/25/2022   HDL 57.20 09/25/2022   LDLCALC 81 09/25/2022   ALT 11 09/25/2022   AST 15 09/25/2022   NA 137 09/25/2022   K 4.1 09/25/2022   CL 102 09/25/2022   CREATININE 0.99 09/25/2022   BUN 20 09/25/2022   CO2 28 09/25/2022   TSH 0.58 05/22/2022   HGBA1C 6.6 (H) 09/25/2022   MICROALBUR 3.0 (H) 09/25/2022     MM 3D SCREENING MAMMOGRAM BILATERAL BREAST  Result Date: 08/22/2022 CLINICAL DATA:  Screening. EXAM: DIGITAL SCREENING BILATERAL MAMMOGRAM WITH TOMOSYNTHESIS AND CAD TECHNIQUE: Bilateral screening digital craniocaudal and mediolateral oblique mammograms were obtained. Bilateral screening digital breast tomosynthesis was performed. The images were evaluated with computer-aided detection. COMPARISON:  Previous exam(s). ACR Breast Density Category b: There are scattered areas of fibroglandular density. FINDINGS: There are no findings suspicious for malignancy. IMPRESSION: No mammographic evidence of malignancy. A result letter of this screening mammogram will be mailed directly to the patient. RECOMMENDATION: Screening mammogram in one year. (Code:[id number]-B-01Y) BI-RADS CATEGORY  1: Negative. Electronically Signed   By: Elberta Fortis M.D.   On: 08/22/2022 08:09       Assessment & Plan:  Need for influenza vaccination -     Flu Vaccine Trivalent High Dose (Fluad)  Aortic atherosclerosis (HCC) Assessment & Plan: Continue repatha.    Anemia, unspecified type Assessment & Plan: Follow cbc.    Hypercholesterolemia Assessment & Plan: Intolerant to statin medication.  On repatha. Will stop zetia. Cholesterol as outlined.  Low cholesterol diet and exercise.  Follow lipid panel.    Primary hypertension Assessment & Plan: Continue lisinopril.  Blood pressure as outlined.   On jardiance 25mg  q day. Stay hydrated.   Follow pressures.  Follow metabolic panel.    Hypothyroidism, unspecified type Assessment & Plan: Evaluated by Dr Tedd Sias.  Recommended f/u in 12 months. Last evaluated 10/2021.  Follow tsh.    Statin myopathy Assessment & Plan: Intolerant to statin medication.  On repatha.  Type 2 diabetes mellitus with hyperglycemia, without long-term current use of insulin (HCC) Assessment & Plan: Continue jardiance and glipizide. Low carb diet and exercise.  Follow met b and a1c.  Lab  Results  Component Value Date   HGBA1C 6.6 (H) 09/25/2022      Pain of right lower extremity Assessment & Plan: Right lower leg/calf pain as outlined. Main complaint is soreness - legs - mostly lateral right lower leg/right calf.  Feels heavy.  Also pain in buttock.  Has had previous MRI and NCS.  Has tried gabapentin for neuropathy.  Persistent pain - right lower leg - into calf. Refer to ortho for further evaluation.   Orders: -     Ambulatory referral to Orthopedic Surgery     D[place], MD

## 2022-10-01 ENCOUNTER — Encounter: Payer: Self-pay | Admitting: Internal Medicine

## 2022-10-01 NOTE — Assessment & Plan Note (Signed)
Continue repatha.

## 2022-10-01 NOTE — Assessment & Plan Note (Signed)
Continue jardiance and glipizide. Low carb diet and exercise.  Follow met b and a1c.  Lab Results  Component Value Date   HGBA1C 6.6 (H) 09/25/2022

## 2022-10-01 NOTE — Assessment & Plan Note (Signed)
Intolerant to statin medication.  On repatha. Will stop zetia. Cholesterol as outlined.  Low cholesterol diet and exercise.  Follow lipid panel.

## 2022-10-01 NOTE — Assessment & Plan Note (Signed)
Right lower leg/calf pain as outlined. Main complaint is soreness - legs - mostly lateral right lower leg/right calf.  Feels heavy.  Also pain in buttock.  Has had previous MRI and NCS.  Has tried gabapentin for neuropathy.  Persistent pain - right lower leg - into calf. Refer to ortho for further evaluation.

## 2022-10-01 NOTE — Assessment & Plan Note (Signed)
Continue lisinopril.  Blood pressure as outlined.   On jardiance 25mg  q day. Stay hydrated.   Follow pressures.  Follow metabolic panel.

## 2022-10-01 NOTE — Assessment & Plan Note (Signed)
Evaluated by Dr Tedd Sias.  Recommended f/u in 12 months. Last evaluated 10/2021.  Follow tsh.

## 2022-10-01 NOTE — Assessment & Plan Note (Signed)
Follow cbc.  

## 2022-10-01 NOTE — Assessment & Plan Note (Signed)
Intolerant to statin medication.  On repatha.

## 2022-11-01 ENCOUNTER — Telehealth: Payer: Self-pay

## 2022-11-01 NOTE — Patient Outreach (Signed)
  Care Coordination   11/01/2022 Name: Wanda Ruiz MRN: [id number] DOB: 03/15/1935   Care Coordination Outreach Attempts:  An unsuccessful telephone outreach was attempted today to offer the patient information about available care coordination services. HIPAA compliant message left with call back phone number and return call request.   Follow Up Plan:  Additional outreach attempts will be made to offer the patient care coordination information and services.   Encounter Outcome:  No Answer   Care Coordination Interventions:  No, not indicated    George Ina [facility] Care Coordination [phone] direct line

## 2022-11-19 ENCOUNTER — Other Ambulatory Visit: Payer: Self-pay | Admitting: Internal Medicine

## 2022-12-11 ENCOUNTER — Telehealth: Payer: Self-pay | Admitting: Internal Medicine

## 2022-12-11 NOTE — Telephone Encounter (Signed)
Pt need PA for repatha

## 2022-12-11 NOTE — Telephone Encounter (Signed)
Prior auth needed for Repatha

## 2022-12-12 ENCOUNTER — Telehealth: Payer: Self-pay

## 2022-12-12 ENCOUNTER — Other Ambulatory Visit (HOSPITAL_COMMUNITY): Payer: Self-pay

## 2022-12-12 NOTE — Telephone Encounter (Signed)
Pharmacy Patient Advocate Encounter  Received notification from [facility] that Prior Authorization for Repatha SureClick 140MG /ML auto-injectors has been APPROVED from 11/12/22 to 12/12/23. Unable to obtain price due to refill too soon rejection, last fill date 12/12/22 next available fill date 02/12/23   PA #/Case ID/Reference #: [id number]

## 2022-12-12 NOTE — Telephone Encounter (Signed)
Noted  

## 2022-12-13 NOTE — Telephone Encounter (Signed)
Patient is aware 

## 2023-01-02 ENCOUNTER — Other Ambulatory Visit: Payer: Self-pay | Admitting: Internal Medicine

## 2023-01-02 DIAGNOSIS — E1165 Type 2 diabetes mellitus with hyperglycemia: Secondary | ICD-10-CM

## 2023-01-16 ENCOUNTER — Telehealth: Payer: Self-pay | Admitting: Internal Medicine

## 2023-01-16 DIAGNOSIS — E1165 Type 2 diabetes mellitus with hyperglycemia: Secondary | ICD-10-CM

## 2023-01-16 DIAGNOSIS — E7800 Pure hypercholesterolemia, unspecified: Secondary | ICD-10-CM

## 2023-01-16 DIAGNOSIS — E039 Hypothyroidism, unspecified: Secondary | ICD-10-CM

## 2023-01-16 DIAGNOSIS — I10 Essential (primary) hypertension: Secondary | ICD-10-CM

## 2023-01-16 NOTE — Telephone Encounter (Signed)
 Patient need orders

## 2023-01-16 NOTE — Telephone Encounter (Signed)
 Orders placed for labs

## 2023-01-16 NOTE — Addendum Note (Signed)
 Addended by: Charm Barges on: 01/16/2023 12:28 PM   Modules accepted: Orders

## 2023-01-25 ENCOUNTER — Other Ambulatory Visit: Payer: Medicare Other

## 2023-01-25 DIAGNOSIS — I10 Essential (primary) hypertension: Secondary | ICD-10-CM | POA: Diagnosis not present

## 2023-01-25 DIAGNOSIS — E1165 Type 2 diabetes mellitus with hyperglycemia: Secondary | ICD-10-CM | POA: Diagnosis not present

## 2023-01-25 DIAGNOSIS — E7800 Pure hypercholesterolemia, unspecified: Secondary | ICD-10-CM | POA: Diagnosis not present

## 2023-01-25 LAB — BASIC METABOLIC PANEL
BUN: 22 mg/dL (ref 6–23)
CO2: 25 meq/L (ref 19–32)
Calcium: 9.3 mg/dL (ref 8.4–10.5)
Chloride: 105 meq/L (ref 96–112)
Creatinine, Ser: 0.92 mg/dL (ref 0.40–1.20)
GFR: 56.66 mL/min — ABNORMAL LOW (ref >60.00)
Glucose, Bld: 114 mg/dL — ABNORMAL HIGH (ref 70–99)
Potassium: 4 meq/L (ref 3.5–5.1)
Sodium: 139 meq/L (ref 135–145)

## 2023-01-25 LAB — HEPATIC FUNCTION PANEL
ALT: 11 U/L (ref 0–35)
AST: 18 U/L (ref 0–37)
Albumin: 4.4 g/dL (ref 3.5–5.2)
Alkaline Phosphatase: 45 U/L (ref 39–117)
Bilirubin, Direct: 0.1 mg/dL (ref 0.0–0.3)
Total Bilirubin: 0.5 mg/dL (ref 0.2–1.2)
Total Protein: 7.7 g/dL (ref 6.0–8.3)

## 2023-01-25 LAB — HEMOGLOBIN A1C: Hgb A1c MFr Bld: 6.9 % — ABNORMAL HIGH (ref 4.6–6.5)

## 2023-01-25 LAB — LIPID PANEL
Cholesterol: 140 mg/dL (ref 0–200)
HDL: 52.9 mg/dL (ref >39.00)
LDL Cholesterol: 75 mg/dL (ref 0–99)
NonHDL: 86.97
Total CHOL/HDL Ratio: 3
Triglycerides: 61 mg/dL (ref 0.0–149.0)
VLDL: 12.2 mg/dL (ref 0.0–40.0)

## 2023-01-29 ENCOUNTER — Encounter: Payer: Self-pay | Admitting: Internal Medicine

## 2023-01-29 ENCOUNTER — Ambulatory Visit: Payer: Medicare Other | Admitting: Internal Medicine

## 2023-01-29 VITALS — BP 120/72 | HR 77 | Temp 98.0°F | Resp 16 | Ht 63.0 in | Wt 130.8 lb

## 2023-01-29 DIAGNOSIS — Z0000 Encounter for general adult medical examination without abnormal findings: Secondary | ICD-10-CM | POA: Diagnosis not present

## 2023-01-29 DIAGNOSIS — I700 Atherosclerosis of aorta: Secondary | ICD-10-CM

## 2023-01-29 DIAGNOSIS — E7800 Pure hypercholesterolemia, unspecified: Secondary | ICD-10-CM | POA: Diagnosis not present

## 2023-01-29 DIAGNOSIS — D649 Anemia, unspecified: Secondary | ICD-10-CM

## 2023-01-29 DIAGNOSIS — G720 Drug-induced myopathy: Secondary | ICD-10-CM

## 2023-01-29 DIAGNOSIS — E1165 Type 2 diabetes mellitus with hyperglycemia: Secondary | ICD-10-CM

## 2023-01-29 DIAGNOSIS — E039 Hypothyroidism, unspecified: Secondary | ICD-10-CM

## 2023-01-29 DIAGNOSIS — M5416 Radiculopathy, lumbar region: Secondary | ICD-10-CM | POA: Insufficient documentation

## 2023-01-29 DIAGNOSIS — I10 Essential (primary) hypertension: Secondary | ICD-10-CM

## 2023-01-29 DIAGNOSIS — T466X5A Adverse effect of antihyperlipidemic and antiarteriosclerotic drugs, initial encounter: Secondary | ICD-10-CM

## 2023-01-29 DIAGNOSIS — Z7984 Long term (current) use of oral hypoglycemic drugs: Secondary | ICD-10-CM

## 2023-01-29 LAB — HM DIABETES FOOT EXAM

## 2023-01-29 MED ORDER — OMEPRAZOLE 20 MG PO CPDR: 20.0000 mg | DELAYED_RELEASE_CAPSULE | Freq: Every day | ORAL | 3 refills | Status: AC

## 2023-01-29 MED ORDER — GLIPIZIDE ER 5 MG PO TB24: 5.0000 mg | ORAL_TABLET | Freq: Every day | ORAL | 3 refills | Status: DC

## 2023-01-29 MED ORDER — REPATHA SURECLICK 140 MG/ML ~~LOC~~ SOAJ: 140.0000 mg | SUBCUTANEOUS | 3 refills | Status: DC

## 2023-01-29 MED ORDER — EZETIMIBE 10 MG PO TABS: 10.0000 mg | ORAL_TABLET | Freq: Every day | ORAL | 1 refills | Status: DC

## 2023-01-29 NOTE — Assessment & Plan Note (Signed)
Physical today 01/29/23.   Colonoscopy 11/2017.  Mammogram 08/18/22 - Birads I.

## 2023-01-29 NOTE — Assessment & Plan Note (Signed)
Continue lisinopril.  Blood pressure as outlined.   On jardiance 25mg  q day. Stay hydrated.   Follow pressures.  Follow metabolic panel.

## 2023-01-29 NOTE — Progress Notes (Signed)
Subjective:    [name], female    DOB: 01/01/1936, 88 y.o.   MRN: [id number]  Patient here for  Chief Complaint  Patient presents with   Annual Exam    HPI Here for a physical exam -  history of hypercholesterolemia (on repatha), hypertension and diabetes. Continues on jardiance and glipizide. On repatha for cholesterol. Had persistent leg pain. Saw orth 10/04/22 - felt to be related to her back - lumbar radiculopathy. Recommended PT. She states she was given exercises to do at home. Feels made worse.  Has seen Dr Yves Dill previously. Request referral back. Had f/u with Dr Tedd Sias 10/2022 - f/u [facility]. Recommended f/u ultrasound and tsh in 12 months. Breathing stable. No abdominal pain. Bowels moving and regular on her current regimen.    Past Medical History:  Diagnosis Date   Anemia    Diabetes mellitus (HCC)    GERD (gastroesophageal reflux disease)    Hypercholesterolemia    Hypertension    Hypothyroidism    Osteoporosis    Past Surgical History:  Procedure Laterality Date   COLONOSCOPY     COLONOSCOPY WITH PROPOFOL N/A 11/09/2017   Procedure: COLONOSCOPY WITH PROPOFOL;  Surgeon: Christena Deem, MD;  Location: [facility] ENDOSCOPY;  Service: Endoscopy;  Laterality: N/A;   CYST EXCISION     low back   ESOPHAGOGASTRODUODENOSCOPY (EGD) WITH PROPOFOL N/A 11/09/2017   Procedure: ESOPHAGOGASTRODUODENOSCOPY (EGD) WITH PROPOFOL;  Surgeon: Christena Deem, MD;  Location: [facility] ENDOSCOPY;  Service: Endoscopy;  Laterality: N/A;   THYROID SURGERY  1992   left hemi-thyroidectomy (colloid goiter0   UPPER GASTROINTESTINAL ENDOSCOPY     Family History  Problem Relation Age of Onset   Emphysema Father    Heart disease Mother        myocardial infarction (58)   Hypertension Mother    Diabetes Mother    Heart disease Brother        drug use   Diabetes Mellitus II Sister        x3   Hypertension Sister    Breast cancer Other        niece   Breast cancer Sister     Cancer Sister    Colon cancer Neg Hx    Social History   Socioeconomic History   Marital status: Married    Spouse name: Not on file   Number of children: 2   Years of education: Not on file   Highest education level: Not on file  Occupational History   Not on file  Tobacco Use   Smoking status: Never   Smokeless tobacco: Never  Vaping Use   Vaping status: Never Used  Substance and Sexual Activity   Alcohol use: No    Alcohol/week: 0.0 standard drinks of alcohol    Comment: occasional   Drug use: Never   Sexual activity: Not on file  Other Topics Concern   Not on file  Social History Narrative   In [place]; with husband; never smoked;  no alcohol; telephone receptionist.    Social Drivers of Health   Financial Resource Strain: Low Risk  (06/30/2022)   Overall Financial Resource Strain (CARDIA)    Difficulty of Paying Living Expenses: Not hard at all  Food Insecurity: No Food Insecurity (06/30/2022)   Hunger Vital Sign    Worried About Running Out of Food in the Last Year: Never true    Ran Out of Food in the Last Year: Never true  Transportation Needs: No  Transportation Needs (06/30/2022)   PRAPARE - Administrator, Civil Service (Medical): No    Lack of Transportation (Non-Medical): No  Physical Activity: Insufficiently Active (06/30/2022)   Exercise Vital Sign    Days of Exercise per Week: 3 days    Minutes of Exercise per Session: 30 min  Stress: No Stress Concern Present (06/30/2022)   [employer] of Occupational Health - Occupational Stress Questionnaire    Feeling of Stress : Not at all  Social Connections: Socially Integrated (06/30/2022)   Social Connection and Isolation Panel [NHANES]    Frequency of Communication with Friends and Family: More than three times a week    Frequency of Social Gatherings with Friends and Family: Three times a week    Attends Religious Services: More than 4 times per year    Active Member of Clubs or  Organizations: Yes    Attends Banker Meetings: More than 4 times per year    Marital Status: Married     Review of Systems  Constitutional:  Negative for appetite change and unexpected weight change.  HENT:  Negative for congestion, sinus pressure and sore throat.   Eyes:  Negative for pain and visual disturbance.  Respiratory:  Negative for cough, chest tightness and shortness of breath.   Cardiovascular:  Negative for chest pain, palpitations and leg swelling.  Gastrointestinal:  Negative for abdominal pain, diarrhea, nausea and vomiting.  Genitourinary:  Negative for difficulty urinating and dysuria.  Musculoskeletal:  Negative for joint swelling and myalgias.       Right low back/buttock and leg pain.   Skin:  Negative for color change and rash.  Neurological:  Negative for dizziness and headaches.  Hematological:  Negative for adenopathy. Does not bruise/bleed easily.  Psychiatric/Behavioral:  Negative for agitation and dysphoric mood.        Objective:     BP 120/72   Pulse 77   Temp 98 F (36.7 C)   Resp 16   Ht 5\' 3"  (1.6 m)   Wt 130 lb 12.8 oz (59.3 kg)   SpO2 98%   BMI 23.17 kg/m  Wt Readings from Last 3 Encounters:  01/29/23 130 lb 12.8 oz (59.3 kg)  09/27/22 130 lb 6.4 oz (59.1 kg)  06/30/22 128 lb (58.1 kg)    Physical Exam Vitals reviewed.  Constitutional:      General: She is not in acute distress.    Appearance: Normal appearance. She is well-developed.  HENT:     Head: Normocephalic and atraumatic.     Right Ear: External ear normal.     Left Ear: External ear normal.     Mouth/Throat:     Pharynx: No oropharyngeal exudate or posterior oropharyngeal erythema.  Eyes:     General: No scleral icterus.       Right eye: No discharge.        Left eye: No discharge.     Conjunctiva/sclera: Conjunctivae normal.  Neck:     Thyroid: No thyromegaly.  Cardiovascular:     Rate and Rhythm: Normal rate and regular rhythm.  Pulmonary:      Effort: No tachypnea, accessory muscle usage or respiratory distress.     Breath sounds: Normal breath sounds. No decreased breath sounds or wheezing.  Chest:  Breasts:    Right: No inverted nipple, mass, nipple discharge or tenderness (no axillary adenopathy).     Left: No inverted nipple, mass, nipple discharge or tenderness (no axilarry adenopathy).  Abdominal:  General: Bowel sounds are normal.     Palpations: Abdomen is soft.     Tenderness: There is no abdominal tenderness.  Musculoskeletal:        General: No swelling or tenderness.     Cervical back: Neck supple.  Lymphadenopathy:     Cervical: No cervical adenopathy.  Skin:    Findings: No erythema or rash.  Neurological:     Mental Status: She is alert and oriented to person, place, and time.  Psychiatric:        Mood and Affect: Mood normal.        Behavior: Behavior normal.         Outpatient Encounter Medications as of 01/29/2023  Medication Sig   ACCU-CHEK AVIVA PLUS test strip USE TO CHECK BLOOD SUGAR THREE TIMES A DAY   ACCU-CHEK SOFTCLIX LANCETS lancets CHECK BLOOD SUGAR ONCE A DAY DX E11.9   aspirin 81 MG tablet Take 81 mg by mouth daily.   Blood Glucose Calibration (ACCU-CHEK GUIDE CONTROL) LIQD Used to check glucometer for accurateness.   calcium-vitamin D (OSCAL WITH D) 250-125 MG-UNIT tablet Take 1 tablet by mouth daily.   Evolocumab (REPATHA SURECLICK) 140 MG/ML SOAJ Inject 140 mg into the skin every 14 (fourteen) days.   ezetimibe (ZETIA) 10 MG tablet Take 1 tablet (10 mg total) by mouth daily.   glipiZIDE (GLUCOTROL XL) 5 MG 24 hr tablet Take 1 tablet (5 mg total) by mouth daily.   JARDIANCE 25 MG TABS tablet TAKE 1 TABLET DAILY BEFORE BREAKFAST   lisinopril (ZESTRIL) 10 MG tablet TAKE 1 TABLET DAILY   Multiple Vitamin (MULTIVITAMIN) tablet Take 1 tablet by mouth daily.   omeprazole (PRILOSEC) 20 MG capsule Take 1 capsule (20 mg total) by mouth daily.   [DISCONTINUED] Evolocumab (REPATHA SURECLICK)  140 MG/ML SOAJ INJECT 140 MG INTO THE SKIN EVERY 14 (FOURTEEN) DAYS. (PA DENIED)   [DISCONTINUED] ezetimibe (ZETIA) 10 MG tablet TAKE 1 TABLET BY MOUTH EVERY DAY   [DISCONTINUED] glipiZIDE (GLUCOTROL XL) 5 MG 24 hr tablet Take 1 tablet (5 mg total) by mouth daily.   [DISCONTINUED] omeprazole (PRILOSEC) 20 MG capsule Take 1 capsule (20 mg total) by mouth daily.   No facility-administered encounter medications on file as of 01/29/2023.     Lab Results  Component Value Date   WBC 8.5 05/22/2022   HGB 12.4 05/22/2022   HCT 38.5 05/22/2022   PLT 263.0 05/22/2022   GLUCOSE 114 (H) 01/25/2023   CHOL 140 01/25/2023   TRIG 61.0 01/25/2023   HDL 52.90 01/25/2023   LDLCALC 75 01/25/2023   ALT 11 01/25/2023   AST 18 01/25/2023   NA 139 01/25/2023   K 4.0 01/25/2023   CL 105 01/25/2023   CREATININE 0.92 01/25/2023   BUN 22 01/25/2023   CO2 25 01/25/2023   TSH 0.58 05/22/2022   HGBA1C 6.9 (H) 01/25/2023   MICROALBUR 3.0 (H) 09/25/2022    MM 3D SCREENING MAMMOGRAM BILATERAL BREAST Result Date: 08/22/2022 CLINICAL DATA:  Screening. EXAM: DIGITAL SCREENING BILATERAL MAMMOGRAM WITH TOMOSYNTHESIS AND CAD TECHNIQUE: Bilateral screening digital craniocaudal and mediolateral oblique mammograms were obtained. Bilateral screening digital breast tomosynthesis was performed. The images were evaluated with computer-aided detection. COMPARISON:  Previous exam(s). ACR Breast Density Category b: There are scattered areas of fibroglandular density. FINDINGS: There are no findings suspicious for malignancy. IMPRESSION: No mammographic evidence of malignancy. A result letter of this screening mammogram will be mailed directly to the patient. RECOMMENDATION: Screening mammogram in one year. (  Code:[id number]-B-01Y) BI-RADS CATEGORY  1: Negative. Electronically Signed   By: Elberta Fortis M.D.   On: 08/22/2022 08:09       Assessment & Plan:  Hypercholesterolemia Assessment & Plan: Intolerant to statin medication.  On  repatha. Will stop zetia. Cholesterol as outlined.  Low cholesterol diet and exercise.  Follow lipid panel.   Orders: -     Lipid panel; Future -     Hepatic function panel; Future  Type 2 diabetes mellitus with hyperglycemia, without long-term current use of insulin (HCC) Assessment & Plan: Continue jardiance and glipizide. Low carb diet and exercise.  Follow met b and a1c.  Lab Results  Component Value Date   HGBA1C 6.9 (H) 01/25/2023     Orders: -     Hemoglobin A1c; Future  Anemia, unspecified type Assessment & Plan: Check cbc with next labs.   Orders: -     Basic metabolic panel; Future -     CBC with Differential/Platelet; Future -     TSH; Future  Health care maintenance Assessment & Plan: Physical today 01/29/23.   Colonoscopy 11/2017.  Mammogram 08/18/22 - Birads I.    Lumbar radiculopathy Assessment & Plan: Increased pain - right buttock/right leg.  Persistent. Did not tolerate gabapentin. Would like for her to avoid antiinflammatory medication due to decreased GFR. Discussed tylenol arthritis - two tablets bid prn.  She has seen Dr Yves Dill previously.  Arrange f/u to discuss other treatment options. She feels the exercises are aggravating her symptoms.   Orders: -     Ambulatory referral to Orthopedic Surgery  Aortic atherosclerosis [facility]) Assessment & Plan: Continue repatha.    Primary hypertension Assessment & Plan: Continue lisinopril.  Blood pressure as outlined.   On jardiance 25mg  q day. Stay hydrated.   Follow pressures.  Follow metabolic panel.    Hypothyroidism, unspecified type Assessment & Plan: Evaluated by Dr Tedd Sias.  Recommended f/u in 12 months. Last evaluated 10/2022.  Follow tsh.    Statin myopathy Assessment & Plan: Intolerant to statin medication.  On repatha.    Other orders -     Repatha SureClick; Inject 140 mg into the skin every 14 (fourteen) days.  Dispense: 6 mL; Refill: 3 -     Ezetimibe; Take 1 tablet (10 mg total) by mouth  daily.  Dispense: 90 tablet; Refill: 1 -     glipiZIDE ER; Take 1 tablet (5 mg total) by mouth daily.  Dispense: 90 tablet; Refill: 3 -     Omeprazole; Take 1 capsule (20 mg total) by mouth daily.  Dispense: 90 capsule; Refill: 3     Dale , MD

## 2023-01-29 NOTE — Assessment & Plan Note (Signed)
Continue jardiance and glipizide. Low carb diet and exercise.  Follow met b and a1c.  Lab Results  Component Value Date   HGBA1C 6.9 (H) 01/25/2023

## 2023-01-29 NOTE — Assessment & Plan Note (Signed)
Continue repatha.

## 2023-01-29 NOTE — Assessment & Plan Note (Signed)
Intolerant to statin medication.  On repatha.

## 2023-01-29 NOTE — Assessment & Plan Note (Signed)
Intolerant to statin medication.  On repatha. Will stop zetia. Cholesterol as outlined.  Low cholesterol diet and exercise.  Follow lipid panel.

## 2023-01-29 NOTE — Assessment & Plan Note (Signed)
Increased pain - right buttock/right leg.  Persistent. Did not tolerate gabapentin. Would like for her to avoid antiinflammatory medication due to decreased GFR. Discussed tylenol arthritis - two tablets bid prn.  She has seen Dr Yves Dill previously.  Arrange f/u to discuss other treatment options. She feels the exercises are aggravating her symptoms.

## 2023-01-29 NOTE — Assessment & Plan Note (Signed)
Evaluated by Dr Tedd Sias.  Recommended f/u in 12 months. Last evaluated 10/2022.  Follow tsh.

## 2023-01-29 NOTE — Assessment & Plan Note (Signed)
Check cbc with next labs.  

## 2023-02-06 ENCOUNTER — Other Ambulatory Visit: Payer: Self-pay | Admitting: Physical Medicine and Rehabilitation

## 2023-02-06 DIAGNOSIS — M5416 Radiculopathy, lumbar region: Secondary | ICD-10-CM

## 2023-02-08 ENCOUNTER — Other Ambulatory Visit: Payer: Self-pay | Admitting: Internal Medicine

## 2023-02-09 NOTE — Telephone Encounter (Signed)
 Rx sent to [facility]

## 2023-02-09 NOTE — Telephone Encounter (Signed)
[facility] is needing [facility] instead she has a new health care plan

## 2023-02-10 ENCOUNTER — Ambulatory Visit
Admission: RE | Admit: 2023-02-10 | Discharge: 2023-02-10 | Disposition: A | Discharge status: Home / Self Care | Payer: Medicare Other | Source: Ambulatory Visit | Attending: Physical Medicine and Rehabilitation | Admitting: Physical Medicine and Rehabilitation

## 2023-02-10 DIAGNOSIS — M5416 Radiculopathy, lumbar region: Secondary | ICD-10-CM

## 2023-05-15 LAB — HM DIABETES EYE EXAM

## 2023-05-17 ENCOUNTER — Encounter: Payer: Self-pay | Admitting: Internal Medicine

## 2023-05-29 ENCOUNTER — Other Ambulatory Visit (INDEPENDENT_AMBULATORY_CARE_PROVIDER_SITE_OTHER): Payer: Medicare Other

## 2023-05-29 DIAGNOSIS — D649 Anemia, unspecified: Secondary | ICD-10-CM | POA: Diagnosis not present

## 2023-05-29 DIAGNOSIS — E7800 Pure hypercholesterolemia, unspecified: Secondary | ICD-10-CM

## 2023-05-29 DIAGNOSIS — E1165 Type 2 diabetes mellitus with hyperglycemia: Secondary | ICD-10-CM | POA: Diagnosis not present

## 2023-05-29 LAB — HEPATIC FUNCTION PANEL
ALT: 10 U/L (ref 0–35)
AST: 17 U/L (ref 0–37)
Albumin: 4 g/dL (ref 3.5–5.2)
Alkaline Phosphatase: 33 U/L — ABNORMAL LOW (ref 39–117)
Bilirubin, Direct: 0.1 mg/dL (ref 0.0–0.3)
Total Bilirubin: 0.4 mg/dL (ref 0.2–1.2)
Total Protein: 7.2 g/dL (ref 6.0–8.3)

## 2023-05-29 LAB — LIPID PANEL
Cholesterol: 125 mg/dL (ref 0–200)
HDL: 43.1 mg/dL (ref >39.00)
LDL Cholesterol: 71 mg/dL (ref 0–99)
NonHDL: 81.44
Total CHOL/HDL Ratio: 3
Triglycerides: 52 mg/dL (ref 0.0–149.0)
VLDL: 10.4 mg/dL (ref 0.0–40.0)

## 2023-05-29 LAB — CBC WITH DIFFERENTIAL/PLATELET
Basophils Absolute: 0.1 10*3/uL (ref 0.0–0.1)
Basophils Relative: 1.4 % (ref 0.0–3.0)
Eosinophils Absolute: 0.2 10*3/uL (ref 0.0–0.7)
Eosinophils Relative: 3.1 % (ref 0.0–5.0)
HCT: 34 % — ABNORMAL LOW (ref 36.0–46.0)
Hemoglobin: 10.8 g/dL — ABNORMAL LOW (ref 12.0–15.0)
Lymphocytes Relative: 29.8 % (ref 12.0–46.0)
Lymphs Abs: 1.8 10*3/uL (ref 0.7–4.0)
MCHC: 31.8 g/dL (ref 30.0–36.0)
MCV: 78.2 fl (ref 78.0–100.0)
Monocytes Absolute: 0.6 10*3/uL (ref 0.1–1.0)
Monocytes Relative: 10.4 % (ref 3.0–12.0)
Neutro Abs: 3.3 10*3/uL (ref 1.4–7.7)
Neutrophils Relative %: 55.3 % (ref 43.0–77.0)
Platelets: 232 10*3/uL (ref 150.0–400.0)
RBC: 4.35 Mil/uL (ref 3.87–5.11)
RDW: 17.5 % — ABNORMAL HIGH (ref 11.5–15.5)
WBC: 6 10*3/uL (ref 4.0–10.5)

## 2023-05-29 LAB — BASIC METABOLIC PANEL WITH GFR
BUN: 24 mg/dL — ABNORMAL HIGH (ref 6–23)
CO2: 24 meq/L (ref 19–32)
Calcium: 9 mg/dL (ref 8.4–10.5)
Chloride: 105 meq/L (ref 96–112)
Creatinine, Ser: 0.87 mg/dL (ref 0.40–1.20)
GFR: 60.45 mL/min (ref >60.00)
Glucose, Bld: 106 mg/dL — ABNORMAL HIGH (ref 70–99)
Potassium: 4.4 meq/L (ref 3.5–5.1)
Sodium: 137 meq/L (ref 135–145)

## 2023-05-29 LAB — TSH: TSH: 0.1 u[IU]/mL — ABNORMAL LOW (ref 0.35–5.50)

## 2023-05-29 LAB — HEMOGLOBIN A1C: Hgb A1c MFr Bld: 6.7 % — ABNORMAL HIGH (ref 4.6–6.5)

## 2023-05-31 ENCOUNTER — Ambulatory Visit: Payer: Medicare Other | Admitting: Internal Medicine

## 2023-05-31 VITALS — BP 120/74 | HR 78 | Temp 98.2°F | Resp 16 | Ht 63.0 in | Wt 125.8 lb

## 2023-05-31 DIAGNOSIS — D649 Anemia, unspecified: Secondary | ICD-10-CM

## 2023-05-31 DIAGNOSIS — R946 Abnormal results of thyroid function studies: Secondary | ICD-10-CM | POA: Diagnosis not present

## 2023-05-31 DIAGNOSIS — E7800 Pure hypercholesterolemia, unspecified: Secondary | ICD-10-CM

## 2023-05-31 DIAGNOSIS — I10 Essential (primary) hypertension: Secondary | ICD-10-CM

## 2023-05-31 DIAGNOSIS — R7989 Other specified abnormal findings of blood chemistry: Secondary | ICD-10-CM | POA: Insufficient documentation

## 2023-05-31 DIAGNOSIS — H9319 Tinnitus, unspecified ear: Secondary | ICD-10-CM

## 2023-05-31 DIAGNOSIS — I700 Atherosclerosis of aorta: Secondary | ICD-10-CM

## 2023-05-31 DIAGNOSIS — T466X5A Adverse effect of antihyperlipidemic and antiarteriosclerotic drugs, initial encounter: Secondary | ICD-10-CM

## 2023-05-31 DIAGNOSIS — E1165 Type 2 diabetes mellitus with hyperglycemia: Secondary | ICD-10-CM | POA: Diagnosis not present

## 2023-05-31 DIAGNOSIS — Z7984 Long term (current) use of oral hypoglycemic drugs: Secondary | ICD-10-CM

## 2023-05-31 DIAGNOSIS — G720 Drug-induced myopathy: Secondary | ICD-10-CM

## 2023-05-31 MED ORDER — REPATHA SURECLICK 140 MG/ML ~~LOC~~ SOAJ: SUBCUTANEOUS | 1 refills | Status: DC

## 2023-05-31 MED ORDER — EZETIMIBE 10 MG PO TABS: 10.0000 mg | ORAL_TABLET | Freq: Every day | ORAL | 1 refills | Status: DC

## 2023-05-31 NOTE — Progress Notes (Unsigned)
 Subjective:    [name], female    DOB: 1935-07-20, 88 y.o.   MRN: [id number]  Patient here for  Chief Complaint  Patient presents with  . Medical Management of Chronic Issues    HPI Here for a scheduled follow up -  follow up regarding hypercholesterolemia (on repatha ), hypertension and diabetes. Continues on jardiance  and glipizide . On repatha  for cholesterol. Had persistent leg pain. Saw orth 10/04/22 - felt to be related to her back - lumbar radiculopathy. Recommended PT. She states she was given exercises to do at home. Feels made worse. Had f/u with Dr Lorelei Rogers 10/2022 - f/u [facility]. Recommended f/u ultrasound and tsh in 12 months. Seeing Dr Erman Hayward - low back pain with radiation - right. Had f/u 03/08/23  and 05/02/23 - tramadol. Wanted to hold on ESI. MRI - severe spinal stenosis at L4-5 and L5-S1.    Past Medical History:  Diagnosis Date  . Anemia   . Diabetes mellitus (HCC)   . GERD (gastroesophageal reflux disease)   . Hypercholesterolemia   . Hypertension   . Hypothyroidism   . Osteoporosis    Past Surgical History:  Procedure Laterality Date  . COLONOSCOPY    . COLONOSCOPY WITH PROPOFOL  N/A 11/09/2017   Procedure: COLONOSCOPY WITH PROPOFOL ;  Surgeon: Deveron Fly, MD;  Location: [facility] ENDOSCOPY;  Service: Endoscopy;  Laterality: N/A;  . CYST EXCISION     low back  . ESOPHAGOGASTRODUODENOSCOPY (EGD) WITH PROPOFOL  N/A 11/09/2017   Procedure: ESOPHAGOGASTRODUODENOSCOPY (EGD) WITH PROPOFOL ;  Surgeon: Deveron Fly, MD;  Location: [facility] ENDOSCOPY;  Service: Endoscopy;  Laterality: N/A;  . THYROID  SURGERY  1992   left hemi-thyroidectomy (colloid goiter0  . UPPER GASTROINTESTINAL ENDOSCOPY     Family History  Problem Relation Age of Onset  . Emphysema Father   . Heart disease Mother        myocardial infarction (69)  . Hypertension Mother   . Diabetes Mother   . Heart disease Brother        drug use  . Diabetes Mellitus II Sister        x3  .  Hypertension Sister   . Breast cancer Other        niece  . Breast cancer Sister   . Cancer Sister   . Colon cancer Neg Hx    Social History   Socioeconomic History  . Marital status: Married    Spouse name: Not on file  . Number of children: 2  . Years of education: Not on file  . Highest education level: Not on file  Occupational History  . Not on file  Tobacco Use  . Smoking status: Never  . Smokeless tobacco: Never  Vaping Use  . Vaping status: Never Used  Substance and Sexual Activity  . Alcohol use: No    Alcohol/week: 0.0 standard drinks of alcohol    Comment: occasional  . Drug use: Never  . Sexual activity: Not on file  Other Topics Concern  . Not on file  Social History Narrative   In [place]; with husband; never smoked;  no alcohol; telephone receptionist.    Social Drivers of Health   Financial Resource Strain: Low Risk  (06/30/2022)   Overall Financial Resource Strain (CARDIA)   . Difficulty of Paying Living Expenses: Not hard at all  Food Insecurity: No Food Insecurity (06/30/2022)   Hunger Vital Sign   . Worried About Programme researcher, broadcasting/film/video in the Last Year: Never true   .  Ran Out of Food in the Last Year: Never true  Transportation Needs: No Transportation Needs (06/30/2022)   PRAPARE - Transportation   . Lack of Transportation (Medical): No   . Lack of Transportation (Non-Medical): No  Physical Activity: Insufficiently Active (06/30/2022)   Exercise Vital Sign   . Days of Exercise per Week: 3 days   . Minutes of Exercise per Session: 30 min  Stress: No Stress Concern Present (06/30/2022)   [employer] of Occupational Health - Occupational Stress Questionnaire   . Feeling of Stress : Not at all  Social Connections: Socially Integrated (06/30/2022)   Social Connection and Isolation Panel [NHANES]   . Frequency of Communication with Friends and Family: More than three times a week   . Frequency of Social Gatherings with Friends and Family: Three  times a week   . Attends Religious Services: More than 4 times per year   . Active Member of Clubs or Organizations: Yes   . Attends Banker Meetings: More than 4 times per year   . Marital Status: Married     Review of Systems     Objective:     BP 120/74   Pulse 78   Temp 98.2 F (36.8 C)   Resp 16   Ht 5\' 3"  (1.6 m)   Wt 125 lb 12.8 oz (57.1 kg)   SpO2 98%   BMI 22.28 kg/m  Wt Readings from Last 3 Encounters:  05/31/23 125 lb 12.8 oz (57.1 kg)  01/29/23 130 lb 12.8 oz (59.3 kg)  09/27/22 130 lb 6.4 oz (59.1 kg)    Physical Exam  {Perform Simple Foot Exam  Perform Detailed exam:1} {Insert foot Exam (Optional):30965}   Outpatient Encounter Medications as of 05/31/2023  Medication Sig  . ACCU-CHEK AVIVA PLUS test strip USE TO CHECK BLOOD SUGAR THREE TIMES A DAY  . ACCU-CHEK SOFTCLIX LANCETS lancets CHECK BLOOD SUGAR ONCE A DAY DX E11.9  . aspirin 81 MG tablet Take 81 mg by mouth daily.  . Blood Glucose Calibration (ACCU-CHEK GUIDE CONTROL) LIQD Used to check glucometer for accurateness.  . calcium -vitamin D  (OSCAL WITH D) 250-125 MG-UNIT tablet Take 1 tablet by mouth daily.  . Evolocumab  (REPATHA  SURECLICK) 140 MG/ML SOAJ INJECT 140 MG INTO THE SKIN EVERY 14 (FOURTEEN) DAYS. (PA DENIED)  . ezetimibe  (ZETIA ) 10 MG tablet Take 1 tablet (10 mg total) by mouth daily.  . glipiZIDE  (GLUCOTROL  XL) 5 MG 24 hr tablet Take 1 tablet (5 mg total) by mouth daily.  . JARDIANCE  25 MG TABS tablet TAKE 1 TABLET DAILY BEFORE BREAKFAST  . lisinopril  (ZESTRIL ) 10 MG tablet TAKE 1 TABLET DAILY  . Multiple Vitamin (MULTIVITAMIN) tablet Take 1 tablet by mouth daily.  . omeprazole  (PRILOSEC) 20 MG capsule Take 1 capsule (20 mg total) by mouth daily.  . traMADol (ULTRAM) 50 MG tablet Take 25-50 mg by mouth 2 (two) times daily as needed.   No facility-administered encounter medications on file as of 05/31/2023.     Lab Results  Component Value Date   WBC 6.0 05/29/2023    HGB 10.8 (L) 05/29/2023   HCT 34.0 (L) 05/29/2023   PLT 232.0 05/29/2023   GLUCOSE 106 (H) 05/29/2023   CHOL 125 05/29/2023   TRIG 52.0 05/29/2023   HDL 43.10 05/29/2023   LDLCALC 71 05/29/2023   ALT 10 05/29/2023   AST 17 05/29/2023   NA 137 05/29/2023   K 4.4 05/29/2023   CL 105 05/29/2023   CREATININE 0.87  05/29/2023   BUN 24 (H) 05/29/2023   CO2 24 05/29/2023   TSH 0.10 (L) 05/29/2023   HGBA1C 6.7 (H) 05/29/2023   MICROALBUR 3.0 (H) 09/25/2022    MR LUMBAR SPINE WO CONTRAST Result Date: 02/25/2023 CLINICAL DATA:  Chronic low back pain and right buttock and leg pain. EXAM: MRI LUMBAR SPINE WITHOUT CONTRAST TECHNIQUE: Multiplanar, multisequence MR imaging of the lumbar spine was performed. No intravenous contrast was administered. COMPARISON:  Lumbar spine MRI 01/26/2015 FINDINGS: Segmentation: There are five lumbar type vertebral bodies. The last full intervertebral disc space is labeled L5-S1. This correlates with the prior MRI examination. Alignment: Overall stable appearing degenerative lumbar spondylosis with multilevel degenerative listhesis most notably at L4-5. Vertebrae: Endplate reactive changes most notably at L2-3 and L4-5 but no fractures or bone lesions. Stable L2 hemangioma. Conus medullaris and cauda equina: Conus extends to the L1-2 level. Conus and cauda equina appear normal. Paraspinal and other soft tissues: No significant paraspinal or retroperitoneal findings. No aortic aneurysm retroperitoneal adenopathy. Disc levels: T12-L1: No significant findings. L1-2: Mild annular bulge and moderate facet disease but no disc protrusions, spinal or foraminal stenosis. L2-3: Degenerative disc disease and facet disease with bulging annulus, osteophytic ridging and ligamentum flavum thickening all contributing to mild spinal and mild to moderate bilateral lateral recess stenosis and moderate left foraminal stenosis. L3-4: Mild to moderate multifactorial spinal stenosis and moderate to  moderately severe bilateral lateral recess stenosis. Persistent bulging uncovered disc, osteophytic ridging and advanced facet disease. Findings are progressive when compared to the prior MRI. L4-5: Progressive degenerative disc disease and facet disease. Degenerative anterolisthesis of L4 with broad-based bulging uncovered disc. There is severe facet disease and ligamentum flavum thickening and all of these factors contribute to severe spinal, bilateral lateral recess and foraminal stenosis. L5-S1: Multifactorial moderately severe spinal, bilateral lateral recess and foraminal stenosis. Findings progressive since prior study. IMPRESSION: 1. Progressive degenerative lumbar spondylosis with multilevel degenerative listhesis most notably at L4-5. 2. Mild spinal and mild to moderate bilateral lateral recess stenosis and moderate left foraminal stenosis at L2-3. 3. Mild to moderate multifactorial spinal stenosis and moderate to moderately severe bilateral lateral recess stenosis at L3-4. 4. Severe spinal, bilateral lateral recess and foraminal stenosis at L4-5. 5. Multifactorial moderately severe spinal, bilateral lateral recess and foraminal stenosis at L5-S1. Electronically Signed   By: Marrian Siva M.D.   On: 02/25/2023 10:38       Assessment & Plan:  Hypercholesterolemia  Type 2 diabetes mellitus with hyperglycemia, without long-term current use of insulin (HCC)  Anemia, unspecified type     Dellar Fenton, MD

## 2023-06-01 ENCOUNTER — Ambulatory Visit: Payer: Self-pay | Admitting: Internal Medicine

## 2023-06-01 LAB — CBC WITH DIFFERENTIAL/PLATELET
Basophils Absolute: 0.1 10*3/uL (ref 0.0–0.1)
Basophils Relative: 0.7 % (ref 0.0–3.0)
Eosinophils Absolute: 0.1 10*3/uL (ref 0.0–0.7)
Eosinophils Relative: 1.9 % (ref 0.0–5.0)
HCT: 38.7 % (ref 36.0–46.0)
Hemoglobin: 12.3 g/dL (ref 12.0–15.0)
Lymphocytes Relative: 22.9 % (ref 12.0–46.0)
Lymphs Abs: 1.8 10*3/uL (ref 0.7–4.0)
MCHC: 31.8 g/dL (ref 30.0–36.0)
MCV: 78.5 fl (ref 78.0–100.0)
Monocytes Absolute: 0.7 10*3/uL (ref 0.1–1.0)
Monocytes Relative: 8.3 % (ref 3.0–12.0)
Neutro Abs: 5.2 10*3/uL (ref 1.4–7.7)
Neutrophils Relative %: 66.2 % (ref 43.0–77.0)
Platelets: 270 10*3/uL (ref 150.0–400.0)
RBC: 4.93 Mil/uL (ref 3.87–5.11)
RDW: 17.3 % — ABNORMAL HIGH (ref 11.5–15.5)
WBC: 7.9 10*3/uL (ref 4.0–10.5)

## 2023-06-01 LAB — IBC + FERRITIN
Ferritin: 121.3 ng/mL (ref 10.0–291.0)
Iron: 46 ug/dL (ref 42–145)
Saturation Ratios: 14.7 % — ABNORMAL LOW (ref 20.0–50.0)
TIBC: 312.2 ug/dL (ref 250.0–450.0)
Transferrin: 223 mg/dL (ref 212.0–360.0)

## 2023-06-01 LAB — T3, FREE: T3, Free: 3.2 pg/mL (ref 2.3–4.2)

## 2023-06-01 LAB — TSH: TSH: 0.11 u[IU]/mL — ABNORMAL LOW (ref 0.35–5.50)

## 2023-06-01 LAB — T4, FREE: Free T4: 0.8 ng/dL (ref 0.60–1.60)

## 2023-06-01 LAB — VITAMIN B12: Vitamin B-12: 809 pg/mL (ref 211–911)

## 2023-06-04 ENCOUNTER — Encounter: Payer: Self-pay | Admitting: Internal Medicine

## 2023-06-04 DIAGNOSIS — H9319 Tinnitus, unspecified ear: Secondary | ICD-10-CM | POA: Insufficient documentation

## 2023-06-04 NOTE — Assessment & Plan Note (Signed)
Intolerant to statin medication.  On repatha.

## 2023-06-04 NOTE — Assessment & Plan Note (Signed)
 Persistent ringing in her ears. Some swishing sound. No pain. Refer to ENT for further evaluation.

## 2023-06-04 NOTE — Assessment & Plan Note (Signed)
 Continue lisinopril . Taking jardiance . Stay hydrated. Blood pressure as outlined. No changes in medication. Follow pressures. Follow metabolic panel.

## 2023-06-04 NOTE — Assessment & Plan Note (Signed)
Continue repatha.

## 2023-06-04 NOTE — Assessment & Plan Note (Signed)
 Recheck cbc. Also check iron studies and B12 level.

## 2023-06-04 NOTE — Assessment & Plan Note (Signed)
 Intolerant to statin medication.  On repatha . Cholesterol as outlined.  Low cholesterol diet and exercise. Follow lipid panel.

## 2023-06-04 NOTE — Assessment & Plan Note (Signed)
 Recheck tsh and Free T3 and Free T4.

## 2023-06-04 NOTE — Assessment & Plan Note (Signed)
 Continue jardiance  and glipizide . Low carb diet and exercise.  Follow met b and A1c.  Lab Results  Component Value Date   HGBA1C 6.7 (H) 05/29/2023

## 2023-07-03 ENCOUNTER — Ambulatory Visit (INDEPENDENT_AMBULATORY_CARE_PROVIDER_SITE_OTHER)

## 2023-07-03 VITALS — BP 135/74 | HR 65 | Ht 63.0 in | Wt 130.0 lb

## 2023-07-03 DIAGNOSIS — Z0000 Encounter for general adult medical examination without abnormal findings: Secondary | ICD-10-CM

## 2023-07-03 NOTE — Patient Instructions (Addendum)
 Ms. Wanda Ruiz , Thank you for taking time out of your busy schedule to complete your Annual Wellness Visit with me. I enjoyed our conversation and look forward to speaking with you again next year. I, as well as your care team,  appreciate your ongoing commitment to your health goals. Please review the following plan we discussed and let me know if I can assist you in the future. Your Game plan/ To Do List    Referrals: If you haven't heard from the office you've been referred to, please reach out to them at the phone provided.  Remember to update your covid vaccine Follow up Visits: Next Medicare AWV with our clinical staff: 07/04/24 @ 9:30   Have you seen your provider in the last 6 months (3 months if uncontrolled diabetes)? Yes Next Office Visit with your provider: 10/01/23  Clinician Recommendations:  Aim for 30 minutes of exercise or brisk walking, 6-8 glasses of water, and 5 servings of fruits and vegetables each day.       This is a list of the screening recommended for you and due dates:  Health Maintenance  Topic Date Due   COVID-19 Vaccine (7 - 2024-25 season) 09/10/2022   Flu Shot  08/10/2023   Mammogram  08/18/2023   Hemoglobin A1C  11/29/2023   Complete foot exam   01/29/2024   Eye exam for diabetics  05/14/2024   Medicare Annual Wellness Visit  07/02/2024   Pneumococcal Vaccine for age over 76  Completed   DEXA scan (bone density measurement)  Completed   Zoster (Shingles) Vaccine  Completed   Hepatitis B Vaccine  Aged Out   HPV Vaccine  Aged Out   Meningitis B Vaccine  Aged Out   DTaP/Tdap/Td vaccine  Discontinued    Advanced directives: (Copy Requested) Please bring a copy of your health care power of attorney and living will to the office to be added to your chart at your convenience. You can mail to [facility] [address] [place], [place] [address] or email to [email or web address] Advance Care Planning is important because it:  [x]  Makes  sure you receive the medical care that is consistent with your values, goals, and preferences  [x]  It provides guidance to your family and loved ones and reduces their decisional burden about whether or not they are making the right decisions based on your wishes.Managing Pain Without Opioids Opioids are strong medicines used to treat moderate to severe pain. For some people, especially those who have long-term (chronic) pain, opioids may not be the best choice for pain management due to: Side effects like nausea, constipation, and sleepiness. The risk of addiction (opioid use disorder). The longer you take opioids, the greater your risk of addiction. Pain that lasts for more than 3 months is called chronic pain. Managing chronic pain usually requires more than one approach and is often provided by a team of health care providers working together (multidisciplinary approach). Pain management may be done at a pain management center or pain clinic. How to manage pain without the use of opioids Use non-opioid medicines Non-opioid medicines for pain may include: Over-the-counter or prescription non-steroidal anti-inflammatory drugs (NSAIDs). These may be the first medicines used for pain. They work well for muscle and bone pain, and they reduce swelling. Acetaminophen . This over-the-counter medicine may work well for milder pain but not swelling. Antidepressants. These may be used to treat chronic pain. A certain type of antidepressant (tricyclics) is often used. These medicines  are given in lower doses for pain than when used for depression. Anticonvulsants. These are usually used to treat seizures but may also reduce nerve (neuropathic) pain. Muscle relaxants. These relieve pain caused by sudden muscle tightening (spasms). You may also use a pain medicine that is applied to the skin as a patch, cream, or gel (topical analgesic), such as a numbing medicine. These may cause fewer side effects than  medicines taken by mouth. Do certain therapies as directed Some therapies can help with pain management. They include: Physical therapy. You will do exercises to gain strength and flexibility. A physical therapist may teach you exercises to move and stretch parts of your body that are weak, stiff, or painful. You can learn these exercises at physical therapy visits and practice them at home. Physical therapy may also involve: Massage. Heat wraps or applying heat or cold to affected areas. Electrical signals that interrupt pain signals (transcutaneous electrical nerve stimulation, TENS). Weak lasers that reduce pain and swelling (low-level laser therapy). Signals from your body that help you learn to regulate pain (biofeedback). Occupational therapy. This helps you to learn ways to function at home and work with less pain. Recreational therapy. This involves trying new activities or hobbies, such as a physical activity or drawing. Mental health therapy, including: Cognitive behavioral therapy (CBT). This helps you learn coping skills for dealing with pain. Acceptance and commitment therapy (ACT) to change the way you think and react to pain. Relaxation therapies, including muscle relaxation exercises and mindfulness-based stress reduction. Pain management counseling. This may be individual, family, or group counseling.  Receive medical treatments Medical treatments for pain management include: Nerve block injections. These may include a pain blocker and anti-inflammatory medicines. You may have injections: Near the spine to relieve chronic back or neck pain. Into joints to relieve back or joint pain. Into nerve areas that supply a painful area to relieve body pain. Into muscles (trigger point injections) to relieve some painful muscle conditions. A medical device placed near your spine to help block pain signals and relieve nerve pain or chronic back pain (spinal cord stimulation  device). Acupuncture. Follow these instructions at home Medicines Take over-the-counter and prescription medicines only as told by your health care provider. If you are taking pain medicine, ask your health care providers about possible side effects to watch out for. Do not drive or use heavy machinery while taking prescription opioid pain medicine. Lifestyle  Do not use drugs or alcohol to reduce pain. If you drink alcohol, limit how much you have to: 0-1 drink a day for women who are not pregnant. 0-2 drinks a day for men. Know how much alcohol is in a drink. In the [place] one drink equals one 12 oz bottle of beer (355 mL), one 5 oz glass of wine (148 mL), or one 1 oz glass of hard liquor (44 mL). Do not use any products that contain nicotine or tobacco. These products include cigarettes, chewing tobacco, and vaping devices, such as e-cigarettes. If you need help quitting, ask your health care provider. Eat a healthy diet and maintain a healthy weight. Poor diet and excess weight may make pain worse. Eat foods that are high in fiber. These include fresh fruits and vegetables, whole grains, and beans. Limit foods that are high in fat and processed sugars, such as fried and sweet foods. Exercise regularly. Exercise lowers stress and may help relieve pain. Ask your health care provider what activities and exercises are safe for you. If  your health care provider approves, join an exercise class that combines movement and stress reduction. Examples include yoga and tai chi. Get enough sleep. Lack of sleep may make pain worse. Lower stress as much as possible. Practice stress reduction techniques as told by your therapist. General instructions Work with all your pain management providers to find the treatments that work best for you. You are an important member of your pain management team. There are many things you can do to reduce pain on your own. Consider joining an online or in-person  support group for people who have chronic pain. Keep all follow-up visits. This is important. Where to find more information You can find more information about managing pain without opioids from: American Academy of Pain Medicine: painmed.org Institute for Chronic Pain: instituteforchronicpain.org American Chronic Pain Association: theacpa.org Contact a health care provider if: You have side effects from pain medicine. Your pain gets worse or does not get better with treatments or home therapy. You are struggling with anxiety or depression. Summary Many types of pain can be managed without opioids. Chronic pain may respond better to pain management without opioids. Pain is best managed when you and a team of health care providers work together. Pain management without opioids may include non-opioid medicines, medical treatments, physical therapy, mental health therapy, and lifestyle changes. Tell your health care providers if your pain gets worse or is not being managed well enough. This information is not intended to replace advice given to you by your health care provider. Make sure you discuss any questions you have with your health care provider. Document Revised: 04/07/2020 Document Reviewed: 03/30/202 Elsevier Patient Education  2024 [employer].

## 2023-07-03 NOTE — Progress Notes (Signed)
 Subjective:   Wanda Ruiz is a 88 y.o. who presents for a Medicare Wellness preventive visit.  As a reminder, Annual Wellness Visits don't include a physical exam, and some assessments may be limited, especially if this visit is performed virtually. We may recommend an in-person follow-up visit with your provider if needed.  Visit Complete: Virtual I connected with  Wanda Ruiz on 07/03/23 by a audio enabled telemedicine application and verified that I am speaking with the correct person using two identifiers.  Patient Location: Home  Provider Location: Home Office  I discussed the limitations of evaluation and management by telemedicine. The patient expressed understanding and agreed to proceed.  Vital Signs: Because this visit was a virtual/telehealth visit, some criteria may be missing or patient reported. Any vitals not documented were not able to be obtained and vitals that have been documented are patient reported.  VideoDeclined- This patient declined Librarian, academic. Therefore the visit was completed with audio only.  Persons Participating in Visit: Patient.  AWV Questionnaire: Yes: Patient Medicare AWV questionnaire was completed by the patient on 07/02/23; I have confirmed that all information answered by patient is correct and no changes since this date.  Cardiac Risk Factors include: advanced age (>8men, >65 women);diabetes mellitus;dyslipidemia;hypertension     Objective:    Today's Vitals   07/03/23 1133  BP: 135/74  Pulse: 65  Weight: 130 lb (59 kg)  Height: 5' 3 (1.6 m)   Body mass index is 23.03 kg/m.     07/03/2023   11:46 AM 06/30/2022   12:08 PM 06/28/2021   11:39 AM 06/23/2020    3:43 PM 06/23/2019    8:52 AM 02/21/2019    3:30 PM 06/13/2018   10:47 AM  Advanced Directives  Does Patient Have a Medical Advance Directive? Yes No Yes Yes Yes No Yes  Type of Estate agent of [place];Living will   Healthcare Power of [place];Living will Healthcare Power of [place];Living will Healthcare Power of [place];Living will    Does patient want to make changes to medical advance directive?   No - Patient declined No - Patient declined No - Patient declined  No - Patient declined   Copy of Healthcare Power of Attorney in Chart? No - copy requested  No - copy requested No - copy requested No - copy requested    Would patient like information on creating a medical advance directive?  Yes (MAU/Ambulatory/Procedural Areas - Information given)    No - Patient declined      Data saved with a previous flowsheet row definition    Current Medications (verified) Outpatient Encounter Medications as of 07/03/2023  Medication Sig   ACCU-CHEK AVIVA PLUS test strip USE TO CHECK BLOOD SUGAR THREE TIMES A DAY   ACCU-CHEK SOFTCLIX LANCETS lancets CHECK BLOOD SUGAR ONCE A DAY DX E11.9   acetaminophen  (TYLENOL ) 500 MG tablet Take 500 mg by mouth every 6 (six) hours as needed.   acetaminophen  (TYLENOL ) 650 MG CR tablet Take 650 mg by mouth every 8 (eight) hours as needed for pain.   aspirin 81 MG tablet Take 81 mg by mouth daily.   Blood Glucose Calibration (ACCU-CHEK GUIDE CONTROL) LIQD Used to check glucometer for accurateness.   calcium -vitamin D  (OSCAL WITH D) 250-125 MG-UNIT tablet Take 1 tablet by mouth daily.   Evolocumab  (REPATHA  SURECLICK) 140 MG/ML SOAJ INJECT 140 MG INTO THE SKIN EVERY 14 (FOURTEEN) DAYS. (PA DENIED)   glipiZIDE  (GLUCOTROL  XL) 5 MG 24 hr  tablet Take 1 tablet (5 mg total) by mouth daily.   JARDIANCE  25 MG TABS tablet TAKE 1 TABLET DAILY BEFORE BREAKFAST   lisinopril  (ZESTRIL ) 10 MG tablet TAKE 1 TABLET DAILY   Multiple Vitamin (MULTIVITAMIN) tablet Take 1 tablet by mouth daily.   omeprazole  (PRILOSEC) 20 MG capsule Take 1 capsule (20 mg total) by mouth daily.   traMADol (ULTRAM) 50 MG tablet Take 25-50 mg by mouth 2 (two) times daily as needed.   No facility-administered encounter  medications on file as of 07/03/2023.    Allergies (verified) Patient has no known allergies.   History: Past Medical History:  Diagnosis Date   Anemia    Diabetes mellitus (HCC)    GERD (gastroesophageal reflux disease)    Hypercholesterolemia    Hypertension    Hypothyroidism    Osteoporosis    Past Surgical History:  Procedure Laterality Date   COLONOSCOPY     COLONOSCOPY WITH PROPOFOL  N/A 11/09/2017   Procedure: COLONOSCOPY WITH PROPOFOL ;  Surgeon: Gaylyn Gladis PENNER, MD;  [facility] ENDOSCOPY;  Service: Endoscopy;  Laterality: N/A;   CYST EXCISION     low back   ESOPHAGOGASTRODUODENOSCOPY (EGD) WITH PROPOFOL  N/A 11/09/2017   Procedure: ESOPHAGOGASTRODUODENOSCOPY (EGD) WITH PROPOFOL ;  Surgeon: Gaylyn Gladis PENNER, MD;  Location: [facility] ENDOSCOPY;  Service: Endoscopy;  Laterality: N/A;   THYROID  SURGERY  1992   left hemi-thyroidectomy (colloid goiter0   UPPER GASTROINTESTINAL ENDOSCOPY     Family History  Problem Relation Age of Onset   Emphysema Father    Heart disease Mother        myocardial infarction (40)   Hypertension Mother    Diabetes Mother    Heart disease Brother        drug use   Diabetes Mellitus II Sister        x3   Hypertension Sister    Breast cancer Other        niece   Breast cancer Sister    Cancer Sister    Colon cancer Neg Hx    Social History   Socioeconomic History   Marital status: Married    Spouse name: Not on file   Number of children: 2   Years of education: Not on file   Highest education level: 12th grade  Occupational History   Not on file  Tobacco Use   Smoking status: Never   Smokeless tobacco: Never  Vaping Use   Vaping status: Never Used  Substance and Sexual Activity   Alcohol use: No    Alcohol/week: 0.0 standard drinks of alcohol    Comment: occasional   Drug use: Never   Sexual activity: Not on file  Other Topics Concern   Not on file  Social History Narrative   In [place]; with husband; never smoked;   no alcohol; telephone receptionist.    Social Drivers of Health   Financial Resource Strain: Low Risk  (07/02/2023)   Overall Financial Resource Strain (CARDIA)    Difficulty of Paying Living Expenses: Not hard at all  Food Insecurity: No Food Insecurity (07/02/2023)   Hunger Vital Sign    Worried About Running Out of Food in the Last Year: Never true    Ran Out of Food in the Last Year: Never true  Transportation Needs: No Transportation Needs (07/02/2023)   PRAPARE - Administrator, Civil Service (Medical): No    Lack of Transportation (Non-Medical): No  Physical Activity: Insufficiently Active (07/02/2023)   Exercise Vital Sign  Days of Exercise per Week: 3 days    Minutes of Exercise per Session: 10 min  Stress: No Stress Concern Present (07/02/2023)   [employer] of Occupational Health - Occupational Stress Questionnaire    Feeling of Stress: Not at all  Social Connections: Socially Integrated (07/02/2023)   Social Connection and Isolation Panel    Frequency of Communication with Friends and Family: More than three times a week    Frequency of Social Gatherings with Friends and Family: Twice a week    Attends Religious Services: More than 4 times per year    Active Member of [employer] or Organizations: Yes    Attends Banker Meetings: 1 to 4 times per year    Marital Status: Married    Tobacco Counseling Counseling given: Not Answered    Clinical Intake:  Pre-visit preparation completed: Yes  Pain : No/denies pain     BMI - recorded: 23.03 Nutritional Status: BMI of 19-24  Normal Nutritional Risks: None Diabetes: Yes CBG done?: Yes (per patient FBS 85)  Lab Results  Component Value Date   HGBA1C 6.7 (H) 05/29/2023   HGBA1C 6.9 (H) 01/25/2023   HGBA1C 6.6 (H) 09/25/2022     How often do you need to have someone help you when you read instructions, pamphlets, or other written materials from your doctor or pharmacy?: 1 -  Never  Interpreter Needed?: No  Information entered by :: R. Aryanna Shaver LPN   Activities of Daily Living     07/02/2023    4:18 PM  In your present state of health, do you have any difficulty performing the following activities:  Hearing? 0  Vision? 0  Comment readers  Difficulty concentrating or making decisions? 0  Walking or climbing stairs? 0  Dressing or bathing? 0  Doing errands, shopping? 0  Preparing Food and eating ? N  Using the Toilet? N  In the past six months, have you accidently leaked urine? N  Do you have problems with loss of bowel control? N  Managing your Medications? N  Managing your Finances? N  Housekeeping or managing your Housekeeping? N    Patient Care Team: Glendia Shad, MD as PCP - General (Internal Medicine) [facility], [facility] (Optometry)  I have updated your Care Teams any recent Medical Services you may have received from other providers in the past year.     Assessment:   This is a routine wellness examination for Wanda Ruiz.  Hearing/Vision screen Hearing Screening - Comments:: No issues Vision Screening - Comments:: readers   Goals Addressed             This Visit's Progress    Patient Stated       Wants to stay active, work puzzle books and read       Depression Screen     07/03/2023   11:43 AM 09/27/2022   10:32 AM 06/30/2022   12:06 PM 01/24/2022    1:29 PM 10/17/2021    1:24 PM 07/13/2021   11:43 AM 06/28/2021   11:38 AM  PHQ 2/9 Scores  PHQ - 2 Score 0 0 0 0 0 0 0  PHQ- 9 Score 0 0         Fall Risk     07/02/2023    4:18 PM 09/27/2022   10:32 AM 06/30/2022   12:07 PM 01/24/2022    1:29 PM 10/17/2021    1:24 PM  Fall Risk   Falls in the past year? 0 0  0 0 0  Number falls in past yr: 0 0 0 0   Injury with Fall? 0 0 0 0   Risk for fall due to : No Fall Risks No Fall Risks No Fall Risks No Fall Risks No Fall Risks  Follow up Falls evaluation completed;Falls prevention discussed Falls evaluation completed Falls  prevention discussed;Education provided;Falls evaluation completed Falls evaluation completed  Falls evaluation completed      Data saved with a previous flowsheet row definition    MEDICARE RISK AT HOME:  Medicare Risk at Home Any stairs in or around the home?: (Patient-Rptd) Yes If so, are there any without handrails?: (Patient-Rptd) No Home free of loose throw rugs in walkways, pet beds, electrical cords, etc?: (Patient-Rptd) Yes Adequate lighting in your home to reduce risk of falls?: (Patient-Rptd) Yes Life alert?: (Patient-Rptd) No Use of a cane, walker or w/c?: (Patient-Rptd) No Grab bars in the bathroom?: (Patient-Rptd) Yes Shower chair or bench in shower?: (Patient-Rptd) Yes Elevated toilet seat or a handicapped toilet?: (Patient-Rptd) Yes  TIMED UP AND GO:  Was the test performed?  No  Cognitive Function: 6CIT completed    06/09/2016    1:49 PM  MMSE - Mini Mental State Exam  Orientation to time 5   Orientation to Place 5   Registration 3   Attention/ Calculation 5   Recall 3   Language- name 2 objects 2   Language- repeat 1  Language- follow 3 step command 3   Language- read & follow direction 1   Write a sentence 1   Copy design 1   Total score 30      Data saved with a previous flowsheet row definition        07/03/2023   11:47 AM 06/30/2022   12:08 PM 06/23/2019    8:52 AM 06/13/2018   10:48 AM 06/11/2017    1:47 PM  6CIT Screen  What Year? 0 points 0 points 0 points 0 points 0 points  What month? 0 points 0 points 0 points 0 points 0 points  What time? 0 points 0 points  0 points 0 points  Count back from 20 0 points 0 points  0 points 0 points  Months in reverse 0 points 0 points 0 points 0 points 0 points  Repeat phrase 4 points 0 points  0 points 0 points  Total Score 4 points 0 points  0 points 0 points    Immunizations Immunization History  Administered Date(s) Administered   Fluad Quad(high Dose 65+) 09/10/2018, 10/09/2019, 10/13/2020,  10/17/2021   Fluad Trivalent(High Dose 65+) 09/27/2022   Influenza, High Dose Seasonal PF 10/22/2015, 09/19/2016, 11/22/2017   Influenza,inj,Quad PF,6+ Mos 10/13/2013, 10/15/2014   PFIZER Comirnaty(Gray Top)Covid-19 Tri-Sucrose Vaccine 04/20/2020   PFIZER(Purple Top)SARS-COV-2 Vaccination 02/07/2019, 02/28/2019, 10/04/2019   Pfizer Covid-19 Vaccine Bivalent Booster 41yrs & up 10/16/2020   Pfizer(Comirnaty)Fall Seasonal Vaccine 12 years and older 11/01/2021   Pneumococcal Conjugate-13 02/13/2014   Pneumococcal Polysaccharide-23 01/25/2017   Zoster Recombinant(Shingrix) 10/23/2019, 12/22/2019    Screening Tests Health Maintenance  Topic Date Due   COVID-19 Vaccine (7 - 2024-25 season) 09/10/2022   Medicare Annual Wellness (AWV)  06/30/2023   INFLUENZA VACCINE  08/10/2023   MAMMOGRAM  08/18/2023   HEMOGLOBIN A1C  11/29/2023   FOOT EXAM  01/29/2024   OPHTHALMOLOGY EXAM  05/14/2024   Pneumococcal Vaccine: 50+ Years  Completed   DEXA SCAN  Completed   Zoster Vaccines- Shingrix  Completed   Hepatitis B Vaccines  Aged  Out   HPV VACCINES  Aged Out   Meningococcal B Vaccine  Aged Out   DTaP/Tdap/Td  Discontinued    Health Maintenance  Health Maintenance Due  Topic Date Due   COVID-19 Vaccine (7 - 2024-25 season) 09/10/2022   Medicare Annual Wellness (AWV)  06/30/2023   Health Maintenance Items Addressed: Discussed the need to update covid vaccine  Additional Screening:  Vision Screening: Recommended annual ophthalmology exams for early detection of glaucoma and other disorders of the eye. Up to date [facility] Would you like a referral to an eye doctor? No    Dental Screening: Recommended annual dental exams for proper oral hygiene  Community Resource Referral / Chronic Care Management: CRR required this visit?  No   CCM required this visit?  No   Plan:    I have personally reviewed and noted the following in the patient's chart:   Medical and social history Use  of alcohol, tobacco or illicit drugs  Current medications and supplements including opioid prescriptions. Patient is currently taking opioid prescriptions. Functional ability and status Nutritional status Physical activity Advanced directives List of other physicians Hospitalizations, surgeries, and ER visits in previous 12 months Vitals Screenings to include cognitive, depression, and falls Referrals and appointments  In addition, I have reviewed and discussed with patient certain preventive protocols, quality metrics, and best practice recommendations. A written personalized care plan for preventive services as well as general preventive health recommendations were provided to patient.   Angeline Fredericks, LPN   [id number]   After Visit Summary: (MyChart) Due to this being a telephonic visit, the after visit summary with patients personalized plan was offered to patient via MyChart   Notes: Nothing significant to report at this time.

## 2023-07-26 ENCOUNTER — Other Ambulatory Visit: Payer: Self-pay | Admitting: Internal Medicine

## 2023-07-26 DIAGNOSIS — Z1231 Encounter for screening mammogram for malignant neoplasm of breast: Secondary | ICD-10-CM

## 2023-08-21 ENCOUNTER — Ambulatory Visit
Admission: RE | Admit: 2023-08-21 | Discharge: 2023-08-21 | Disposition: A | Discharge status: Home / Self Care | Source: Ambulatory Visit | Attending: Internal Medicine | Admitting: Internal Medicine

## 2023-08-21 DIAGNOSIS — Z1231 Encounter for screening mammogram for malignant neoplasm of breast: Secondary | ICD-10-CM | POA: Insufficient documentation

## 2023-09-13 ENCOUNTER — Telehealth: Payer: Self-pay | Admitting: Internal Medicine

## 2023-09-13 DIAGNOSIS — I10 Essential (primary) hypertension: Secondary | ICD-10-CM

## 2023-09-13 DIAGNOSIS — E1165 Type 2 diabetes mellitus with hyperglycemia: Secondary | ICD-10-CM

## 2023-09-13 DIAGNOSIS — E7800 Pure hypercholesterolemia, unspecified: Secondary | ICD-10-CM

## 2023-09-13 DIAGNOSIS — E039 Hypothyroidism, unspecified: Secondary | ICD-10-CM

## 2023-09-13 DIAGNOSIS — R7989 Other specified abnormal findings of blood chemistry: Secondary | ICD-10-CM

## 2023-09-13 NOTE — Telephone Encounter (Signed)
 Lab order needed

## 2023-09-13 NOTE — Telephone Encounter (Signed)
 Order placed for labs.

## 2023-09-27 ENCOUNTER — Other Ambulatory Visit

## 2023-09-27 DIAGNOSIS — E1165 Type 2 diabetes mellitus with hyperglycemia: Secondary | ICD-10-CM | POA: Diagnosis not present

## 2023-09-27 DIAGNOSIS — E039 Hypothyroidism, unspecified: Secondary | ICD-10-CM

## 2023-09-27 DIAGNOSIS — E7800 Pure hypercholesterolemia, unspecified: Secondary | ICD-10-CM

## 2023-09-27 LAB — HEPATIC FUNCTION PANEL
ALT: 11 U/L (ref 0–35)
AST: 15 U/L (ref 0–37)
Albumin: 4.2 g/dL (ref 3.5–5.2)
Alkaline Phosphatase: 41 U/L (ref 39–117)
Bilirubin, Direct: 0.1 mg/dL (ref 0.0–0.3)
Total Bilirubin: 0.5 mg/dL (ref 0.2–1.2)
Total Protein: 7.3 g/dL (ref 6.0–8.3)

## 2023-09-27 LAB — LIPID PANEL
Cholesterol: 152 mg/dL (ref 0–200)
HDL: 67 mg/dL (ref >39.00)
LDL Cholesterol: 74 mg/dL (ref 0–99)
NonHDL: 84.56
Total CHOL/HDL Ratio: 2
Triglycerides: 55 mg/dL (ref 0.0–149.0)
VLDL: 11 mg/dL (ref 0.0–40.0)

## 2023-09-27 LAB — BASIC METABOLIC PANEL WITH GFR
BUN: 18 mg/dL (ref 6–23)
CO2: 27 meq/L (ref 19–32)
Calcium: 9.5 mg/dL (ref 8.4–10.5)
Chloride: 104 meq/L (ref 96–112)
Creatinine, Ser: 0.78 mg/dL (ref 0.40–1.20)
GFR: 68.75 mL/min (ref >60.00)
Glucose, Bld: 104 mg/dL — ABNORMAL HIGH (ref 70–99)
Potassium: 4.3 meq/L (ref 3.5–5.1)
Sodium: 138 meq/L (ref 135–145)

## 2023-09-27 LAB — MICROALBUMIN / CREATININE URINE RATIO
Creatinine,U: 76.1 mg/dL
Microalb Creat Ratio: 28.7 mg/g (ref 0.0–30.0)
Microalb, Ur: 2.2 mg/dL — ABNORMAL HIGH (ref 0.0–1.9)

## 2023-09-27 LAB — T3, FREE: T3, Free: 3.6 pg/mL (ref 2.3–4.2)

## 2023-09-27 LAB — T4, FREE: Free T4: 0.89 ng/dL (ref 0.60–1.60)

## 2023-09-27 LAB — HEMOGLOBIN A1C: Hgb A1c MFr Bld: 7.1 % — ABNORMAL HIGH (ref 4.6–6.5)

## 2023-09-27 LAB — TSH: TSH: 0.98 u[IU]/mL (ref 0.35–5.50)

## 2023-09-28 ENCOUNTER — Ambulatory Visit: Payer: Self-pay | Admitting: Internal Medicine

## 2023-10-01 ENCOUNTER — Inpatient Hospital Stay: Admitting: Anesthesiology

## 2023-10-01 ENCOUNTER — Inpatient Hospital Stay
Admission: EM | Admit: 2023-10-01 | Discharge: 2023-10-05 | DRG: 336 | Disposition: A | Discharge status: Home / Self Care | Attending: Surgery | Admitting: Surgery

## 2023-10-01 ENCOUNTER — Other Ambulatory Visit: Payer: Self-pay

## 2023-10-01 ENCOUNTER — Encounter
Admission: EM | Disposition: A | Discharge status: Home / Self Care | Payer: Self-pay | Source: Home / Self Care | Attending: Surgery

## 2023-10-01 ENCOUNTER — Emergency Department

## 2023-10-01 ENCOUNTER — Ambulatory Visit: Admitting: Internal Medicine

## 2023-10-01 ENCOUNTER — Encounter: Payer: Self-pay | Admitting: Internal Medicine

## 2023-10-01 VITALS — BP 120/72 | HR 89 | Temp 98.0°F | Resp 16 | Ht 63.0 in | Wt 124.2 lb

## 2023-10-01 DIAGNOSIS — Z8249 Family history of ischemic heart disease and other diseases of the circulatory system: Secondary | ICD-10-CM

## 2023-10-01 DIAGNOSIS — K660 Peritoneal adhesions (postprocedural) (postinfection): Secondary | ICD-10-CM | POA: Diagnosis present

## 2023-10-01 DIAGNOSIS — K460 Unspecified abdominal hernia with obstruction, without gangrene: Secondary | ICD-10-CM | POA: Diagnosis present

## 2023-10-01 DIAGNOSIS — Z7984 Long term (current) use of oral hypoglycemic drugs: Secondary | ICD-10-CM

## 2023-10-01 DIAGNOSIS — E7800 Pure hypercholesterolemia, unspecified: Secondary | ICD-10-CM

## 2023-10-01 DIAGNOSIS — D72829 Elevated white blood cell count, unspecified: Secondary | ICD-10-CM | POA: Diagnosis not present

## 2023-10-01 DIAGNOSIS — I700 Atherosclerosis of aorta: Secondary | ICD-10-CM | POA: Diagnosis present

## 2023-10-01 DIAGNOSIS — E039 Hypothyroidism, unspecified: Secondary | ICD-10-CM | POA: Diagnosis present

## 2023-10-01 DIAGNOSIS — E119 Type 2 diabetes mellitus without complications: Secondary | ICD-10-CM | POA: Diagnosis present

## 2023-10-01 DIAGNOSIS — I10 Essential (primary) hypertension: Secondary | ICD-10-CM | POA: Diagnosis present

## 2023-10-01 DIAGNOSIS — K219 Gastro-esophageal reflux disease without esophagitis: Secondary | ICD-10-CM | POA: Diagnosis present

## 2023-10-01 DIAGNOSIS — R1032 Left lower quadrant pain: Secondary | ICD-10-CM

## 2023-10-01 DIAGNOSIS — Z7982 Long term (current) use of aspirin: Secondary | ICD-10-CM

## 2023-10-01 DIAGNOSIS — Z79899 Other long term (current) drug therapy: Secondary | ICD-10-CM | POA: Diagnosis not present

## 2023-10-01 DIAGNOSIS — Z833 Family history of diabetes mellitus: Secondary | ICD-10-CM | POA: Diagnosis not present

## 2023-10-01 DIAGNOSIS — K56609 Unspecified intestinal obstruction, unspecified as to partial versus complete obstruction: Secondary | ICD-10-CM

## 2023-10-01 DIAGNOSIS — E1165 Type 2 diabetes mellitus with hyperglycemia: Secondary | ICD-10-CM

## 2023-10-01 DIAGNOSIS — K458 Other specified abdominal hernia without obstruction or gangrene: Secondary | ICD-10-CM | POA: Diagnosis present

## 2023-10-01 DIAGNOSIS — R109 Unspecified abdominal pain: Principal | ICD-10-CM

## 2023-10-01 HISTORY — PX: LAPAROTOMY: SHX154

## 2023-10-01 LAB — COMPREHENSIVE METABOLIC PANEL WITH GFR
ALT: 13 U/L (ref 0–44)
AST: 28 U/L (ref 15–41)
Albumin: 3.7 g/dL (ref 3.5–5.0)
Alkaline Phosphatase: 34 U/L — ABNORMAL LOW (ref 38–126)
Anion gap: 16 — ABNORMAL HIGH (ref 5–15)
BUN: 25 mg/dL — ABNORMAL HIGH (ref 8–23)
CO2: 18 mmol/L — ABNORMAL LOW (ref 22–32)
Calcium: 8.8 mg/dL — ABNORMAL LOW (ref 8.9–10.3)
Chloride: 102 mmol/L (ref 98–111)
Creatinine, Ser: 0.99 mg/dL (ref 0.44–1.00)
GFR, Estimated: 56 mL/min — ABNORMAL LOW (ref >60)
Glucose, Bld: 187 mg/dL — ABNORMAL HIGH (ref 70–99)
Potassium: 4.9 mmol/L (ref 3.5–5.1)
Sodium: 136 mmol/L (ref 135–145)
Total Bilirubin: 1 mg/dL (ref 0.0–1.2)
Total Protein: 7.2 g/dL (ref 6.5–8.1)

## 2023-10-01 LAB — CBC
HCT: 35 % — ABNORMAL LOW (ref 36.0–46.0)
Hemoglobin: 11 g/dL — ABNORMAL LOW (ref 12.0–15.0)
MCH: 25.4 pg — ABNORMAL LOW (ref 26.0–34.0)
MCHC: 31.4 g/dL (ref 30.0–36.0)
MCV: 80.8 fL (ref 80.0–100.0)
Platelets: 236 K/uL (ref 150–400)
RBC: 4.33 MIL/uL (ref 3.87–5.11)
RDW: 16.1 % — ABNORMAL HIGH (ref 11.5–15.5)
WBC: 16.4 K/uL — ABNORMAL HIGH (ref 4.0–10.5)
nRBC: 0 % (ref 0.0–0.2)

## 2023-10-01 LAB — BLOOD GAS, VENOUS
Acid-base deficit: 9.8 mmol/L — ABNORMAL HIGH (ref 0.0–2.0)
Bicarbonate: 16 mmol/L — ABNORMAL LOW (ref 20.0–28.0)
O2 Saturation: 72.2 %
Patient temperature: 37
pCO2, Ven: 34 mmHg — ABNORMAL LOW (ref 44–60)
pH, Ven: 7.28 (ref 7.25–7.43)
pO2, Ven: 39 mmHg (ref 32–45)

## 2023-10-01 LAB — TROPONIN I (HIGH SENSITIVITY): Troponin I (High Sensitivity): 6 ng/L (ref <18)

## 2023-10-01 LAB — LIPASE, BLOOD: Lipase: 30 U/L (ref 11–51)

## 2023-10-01 LAB — CBG MONITORING, ED: Glucose-Capillary: 151 mg/dL — ABNORMAL HIGH (ref 70–99)

## 2023-10-01 SURGERY — LAPAROTOMY, EXPLORATORY
Anesthesia: General | Site: Abdomen

## 2023-10-01 MED ORDER — KETOROLAC TROMETHAMINE 30 MG/ML IJ SOLN
INTRAMUSCULAR | Status: DC | PRN
  Administered 2023-10-01: 15 mg via INTRAVENOUS

## 2023-10-01 MED ORDER — BUPIVACAINE-MELOXICAM ER 200-6 MG/7ML IJ SOLN
INTRAMUSCULAR | Status: AC
  Filled 2023-10-01: qty 1

## 2023-10-01 MED ORDER — ENOXAPARIN SODIUM 40 MG/0.4ML IJ SOSY
40.0000 mg | PREFILLED_SYRINGE | INTRAMUSCULAR | Status: DC
  Administered 2023-10-02: 40 mg via SUBCUTANEOUS
  Filled 2023-10-01: qty 0.4

## 2023-10-01 MED ORDER — MORPHINE SULFATE (PF) 2 MG/ML IV SOLN: 2.0000 mg | INTRAVENOUS | Status: DC | PRN

## 2023-10-01 MED ORDER — ACETAMINOPHEN 10 MG/ML IV SOLN: 1000.0000 mg | Freq: Once | INTRAVENOUS | Status: DC | PRN

## 2023-10-01 MED ORDER — FENTANYL CITRATE PF 50 MCG/ML IJ SOSY
50.0000 ug | PREFILLED_SYRINGE | Freq: Once | INTRAMUSCULAR | Status: AC
  Administered 2023-10-01: 50 ug via INTRAVENOUS
  Filled 2023-10-01: qty 1

## 2023-10-01 MED ORDER — LISINOPRIL 10 MG PO TABS
10.0000 mg | ORAL_TABLET | Freq: Every day | ORAL | Status: DC
  Administered 2023-10-01 – 2023-10-05 (×5): 10 mg via ORAL
  Filled 2023-10-01 (×5): qty 1

## 2023-10-01 MED ORDER — FENTANYL CITRATE (PF) 100 MCG/2ML IJ SOLN
INTRAMUSCULAR | Status: AC
  Filled 2023-10-01: qty 2

## 2023-10-01 MED ORDER — CEFAZOLIN SODIUM 1 G IJ SOLR
INTRAMUSCULAR | Status: AC
Start: 2023-10-01 — End: 2023-10-01
  Filled 2023-10-01: qty 20

## 2023-10-01 MED ORDER — SODIUM CHLORIDE 0.9 % IV BOLUS
1000.0000 mL | Freq: Once | INTRAVENOUS | Status: AC
  Administered 2023-10-01: 1000 mL via INTRAVENOUS

## 2023-10-01 MED ORDER — HYDROMORPHONE HCL 1 MG/ML IJ SOLN: 0.5000 mg | Freq: Once | INTRAMUSCULAR | Status: DC

## 2023-10-01 MED ORDER — OXYMETAZOLINE HCL 0.05 % NA SOLN
NASAL | Status: AC
  Filled 2023-10-01: qty 30

## 2023-10-01 MED ORDER — CEFAZOLIN SODIUM-DEXTROSE 2-4 GM/100ML-% IV SOLN: 2.0000 g | Freq: Once | INTRAVENOUS | Status: DC

## 2023-10-01 MED ORDER — OXYMETAZOLINE HCL 0.05 % NA SOLN
NASAL | Status: DC | PRN
  Administered 2023-10-01: 2 via NASAL

## 2023-10-01 MED ORDER — HYDROCODONE-ACETAMINOPHEN 5-325 MG PO TABS
1.0000 | ORAL_TABLET | Freq: Three times a day (TID) | ORAL | Status: DC | PRN
  Administered 2023-10-02 – 2023-10-05 (×6): 1 via ORAL
  Filled 2023-10-01 (×7): qty 1

## 2023-10-01 MED ORDER — 0.9 % SODIUM CHLORIDE (POUR BTL) OPTIME
TOPICAL | Status: DC | PRN
  Administered 2023-10-01: 50 mL

## 2023-10-01 MED ORDER — ONDANSETRON HCL 4 MG/2ML IJ SOLN
INTRAMUSCULAR | Status: DC | PRN
  Administered 2023-10-01: 4 mg via INTRAVENOUS

## 2023-10-01 MED ORDER — DOCUSATE SODIUM 100 MG PO CAPS: 100.0000 mg | ORAL_CAPSULE | Freq: Two times a day (BID) | ORAL | Status: DC | PRN

## 2023-10-01 MED ORDER — PROPOFOL 10 MG/ML IV BOLUS
INTRAVENOUS | Status: DC | PRN
  Administered 2023-10-01: 90 mg via INTRAVENOUS

## 2023-10-01 MED ORDER — FENTANYL CITRATE (PF) 100 MCG/2ML IJ SOLN
INTRAMUSCULAR | Status: DC | PRN
  Administered 2023-10-01 (×4): 25 ug via INTRAVENOUS

## 2023-10-01 MED ORDER — ONDANSETRON HCL 4 MG/2ML IJ SOLN
INTRAMUSCULAR | Status: AC
  Filled 2023-10-01: qty 2

## 2023-10-01 MED ORDER — TRAMADOL HCL 50 MG PO TABS
50.0000 mg | ORAL_TABLET | Freq: Four times a day (QID) | ORAL | Status: DC | PRN
  Administered 2023-10-04 (×2): 50 mg via ORAL
  Filled 2023-10-01 (×2): qty 1

## 2023-10-01 MED ORDER — BUPIVACAINE-EPINEPHRINE (PF) 0.5% -1:200000 IJ SOLN
INTRAMUSCULAR | Status: AC
  Filled 2023-10-01: qty 30

## 2023-10-01 MED ORDER — DEXAMETHASONE SODIUM PHOSPHATE 10 MG/ML IJ SOLN
INTRAMUSCULAR | Status: DC | PRN
  Administered 2023-10-01: 5 mg via INTRAVENOUS

## 2023-10-01 MED ORDER — OXYCODONE HCL 5 MG PO TABS: 5.0000 mg | ORAL_TABLET | Freq: Once | ORAL | Status: DC | PRN

## 2023-10-01 MED ORDER — IOHEXOL 300 MG/ML  SOLN
100.0000 mL | Freq: Once | INTRAMUSCULAR | Status: AC | PRN
  Administered 2023-10-01: 100 mL via INTRAVENOUS

## 2023-10-01 MED ORDER — FENTANYL CITRATE (PF) 100 MCG/2ML IJ SOLN: 25.0000 ug | INTRAMUSCULAR | Status: DC | PRN

## 2023-10-01 MED ORDER — OXYCODONE HCL 5 MG/5ML PO SOLN: 5.0000 mg | Freq: Once | ORAL | Status: DC | PRN

## 2023-10-01 MED ORDER — KETOROLAC TROMETHAMINE 30 MG/ML IJ SOLN
INTRAMUSCULAR | Status: AC
  Filled 2023-10-01: qty 1

## 2023-10-01 MED ORDER — ONDANSETRON HCL 4 MG/2ML IJ SOLN: 4.0000 mg | Freq: Four times a day (QID) | INTRAMUSCULAR | Status: DC | PRN

## 2023-10-01 MED ORDER — SODIUM CHLORIDE 0.9 % IV SOLN: INTRAVENOUS | Status: DC | PRN

## 2023-10-01 MED ORDER — SODIUM CHLORIDE 0.9 % IV SOLN: INTRAVENOUS | Status: AC

## 2023-10-01 MED ORDER — CEFAZOLIN SODIUM-DEXTROSE 2-3 GM-%(50ML) IV SOLR
INTRAVENOUS | Status: DC | PRN
  Administered 2023-10-01: 2 g via INTRAVENOUS

## 2023-10-01 MED ORDER — PROPOFOL 10 MG/ML IV BOLUS
INTRAVENOUS | Status: AC
  Filled 2023-10-01: qty 20

## 2023-10-01 MED ORDER — INSULIN ASPART 100 UNIT/ML IJ SOLN
0.0000 [IU] | Freq: Three times a day (TID) | INTRAMUSCULAR | Status: DC
  Administered 2023-10-02 – 2023-10-04 (×3): 3 [IU] via SUBCUTANEOUS
  Filled 2023-10-01 (×3): qty 1

## 2023-10-01 MED ORDER — BUPIVACAINE-EPINEPHRINE (PF) 0.5% -1:200000 IJ SOLN
INTRAMUSCULAR | Status: DC | PRN
  Administered 2023-10-01: 30 mL

## 2023-10-01 MED ORDER — ONDANSETRON HCL 4 MG/2ML IJ SOLN
4.0000 mg | Freq: Once | INTRAMUSCULAR | Status: AC
  Administered 2023-10-01: 4 mg via INTRAVENOUS
  Filled 2023-10-01: qty 2

## 2023-10-01 MED ORDER — ROCURONIUM BROMIDE 10 MG/ML (PF) SYRINGE
PREFILLED_SYRINGE | INTRAVENOUS | Status: DC | PRN
  Administered 2023-10-01: 50 mg via INTRAVENOUS

## 2023-10-01 MED ORDER — SUGAMMADEX SODIUM 200 MG/2ML IV SOLN
INTRAVENOUS | Status: DC | PRN
  Administered 2023-10-01: 200 mg via INTRAVENOUS

## 2023-10-01 MED ORDER — DEXAMETHASONE SODIUM PHOSPHATE 10 MG/ML IJ SOLN
INTRAMUSCULAR | Status: AC
  Filled 2023-10-01: qty 1

## 2023-10-01 MED ORDER — SUCCINYLCHOLINE CHLORIDE 200 MG/10ML IV SOSY
PREFILLED_SYRINGE | INTRAVENOUS | Status: DC | PRN
  Administered 2023-10-01: 140 mg via INTRAVENOUS

## 2023-10-01 MED ORDER — LIDOCAINE HCL (PF) 2 % IJ SOLN
INTRAMUSCULAR | Status: DC | PRN
  Administered 2023-10-01: 60 mg via INTRADERMAL

## 2023-10-01 MED ORDER — ONDANSETRON 4 MG PO TBDP
4.0000 mg | ORAL_TABLET | Freq: Four times a day (QID) | ORAL | Status: DC | PRN
  Administered 2023-10-03: 4 mg via ORAL
  Filled 2023-10-01: qty 1

## 2023-10-01 MED ORDER — SODIUM CHLORIDE 0.9 % IV BOLUS: 500.0000 mL | Freq: Once | INTRAVENOUS | Status: DC

## 2023-10-01 MED ORDER — DROPERIDOL 2.5 MG/ML IJ SOLN: 0.6250 mg | Freq: Once | INTRAMUSCULAR | Status: DC | PRN

## 2023-10-01 MED ORDER — ACETAMINOPHEN 325 MG PO TABS
650.0000 mg | ORAL_TABLET | Freq: Three times a day (TID) | ORAL | Status: DC | PRN
  Administered 2023-10-03: 650 mg via ORAL
  Filled 2023-10-01: qty 2

## 2023-10-01 MED ORDER — LIDOCAINE HCL (PF) 2 % IJ SOLN
INTRAMUSCULAR | Status: AC
  Filled 2023-10-01: qty 5

## 2023-10-01 SURGICAL SUPPLY — 37 items
CHLORAPREP W/TINT 26 (MISCELLANEOUS) IMPLANT
DRAPE LAPAROTOMY 100X77 ABD (DRAPES) ×1 IMPLANT
DRSG OPSITE POSTOP 4X10 (GAUZE/BANDAGES/DRESSINGS) IMPLANT
DRSG OPSITE POSTOP 4X8 (GAUZE/BANDAGES/DRESSINGS) IMPLANT
ELECT BLADE 6.5 EXT (BLADE) IMPLANT
ELECTRODE REM PT RTRN 9FT ADLT (ELECTROSURGICAL) ×1 IMPLANT
GAUZE 4X4 16PLY ~~LOC~~+RFID DBL (SPONGE) IMPLANT
GLOVE BIO SURGEON STRL SZ7.5 (GLOVE) IMPLANT
GLOVE BIOGEL PI IND STRL 7.0 (GLOVE) ×1 IMPLANT
GLOVE BIOGEL PI IND STRL 8 (GLOVE) IMPLANT
GLOVE SURG SYN 6.5 PF PI (GLOVE) ×3 IMPLANT
GOWN STRL REUS W/ TWL LRG LVL3 (GOWN DISPOSABLE) ×3 IMPLANT
GOWN STRL REUS W/ TWL XL LVL3 (GOWN DISPOSABLE) IMPLANT
KIT PREVENA INCISION MGT 13 (CANNISTER) IMPLANT
KIT TURNOVER KIT A (KITS) ×1 IMPLANT
LABEL OR SOLS (LABEL) ×1 IMPLANT
MANIFOLD NEPTUNE II (INSTRUMENTS) ×1 IMPLANT
NDL HYPO 22X1.5 SAFETY MO (MISCELLANEOUS) IMPLANT
NEEDLE HYPO 22X1.5 SAFETY MO (MISCELLANEOUS) IMPLANT
NS IRRIG 1000ML POUR BTL (IV SOLUTION) ×1 IMPLANT
PACK BASIN MAJOR ARMC (MISCELLANEOUS) ×1 IMPLANT
PACK COLON CLEAN CLOSURE (MISCELLANEOUS) IMPLANT
RELOAD STAPLE 75 3.8 BLU REG (ENDOMECHANICALS) IMPLANT
RETRACTOR WOUND ALXS 18CM MED (MISCELLANEOUS) IMPLANT
SPONGE T-LAP 18X18 ~~LOC~~+RFID (SPONGE) IMPLANT
STAPLER PROXIMATE 75MM BLUE (STAPLE) IMPLANT
STAPLER SKIN PROX 35W (STAPLE) ×1 IMPLANT
SUT PDS AB 1 TP1 54 (SUTURE) ×1 IMPLANT
SUT SILK 2-0 18XBRD TIE 12 (SUTURE) IMPLANT
SUT SILK 3 0 SH CR/8 (SUTURE) IMPLANT
SUT SILK 3-0 18XBRD TIE 12 (SUTURE) IMPLANT
SUT VIC AB 3-0 SH 27X BRD (SUTURE) IMPLANT
SYR 20ML LL LF (SYRINGE) IMPLANT
SYR BULB IRRIG 60ML STRL (SYRINGE) IMPLANT
TRAP FLUID SMOKE EVACUATOR (MISCELLANEOUS) ×1 IMPLANT
TRAY FOLEY MTR SLVR 16FR STAT (SET/KITS/TRAYS/PACK) ×1 IMPLANT
WATER STERILE IRR 500ML POUR (IV SOLUTION) ×1 IMPLANT

## 2023-10-01 NOTE — Progress Notes (Signed)
 Subjective:    [name], female    DOB: [birth year], 88 y.o.   MRN: [id number]  Patient here for  Chief Complaint  Patient presents with   Medical Management of Chronic Issues    HPI Here for a scheduled follow up -  follow up regarding hypercholesterolemia (on repatha ), hypertension and diabetes. Continues on jardiance  and glipizide . On repatha  for cholesterol. Had persistent leg pain. Saw ortho 10/04/22 - felt to be related to her back - lumbar radiculopathy. Recommended PT. She states she was given exercises to do at home. Feels made worse. Had f/u with Dr Damian 10/2022 - f/u [facility]. Recommended f/u ultrasound and tsh in 12 months. Seeing Dr Avanell - low back pain with radiation - right. Had f/u 03/08/23  and 05/02/23 and most recent 08/02/23 -- tramadol  and tylenol .  Wanted to hold on ESI. MRI - severe spinal stenosis at L4-5 and L5-S1. Last visit, she was referred to ENT for persistent tinnitus. A1c increased 7.1. remains on jardiance  and glipizide . Came in today with vomiting and abdominal pain. No diarrhea. States symptoms started around 3 am. Abdominal pain woke her. Reported some vomiting through the night. No diarrhea. Had a bowel movement this am - hard stool. No fever. No sick contacts. No chest pain or sob. Pain localized mid abdomen. Feels weak. Husband drove her here. Unable to keep food down. Tried to drink water and water came back up.    Past Medical History:  Diagnosis Date   Anemia    Diabetes mellitus (HCC)    GERD (gastroesophageal reflux disease)    Hypercholesterolemia    Hypertension    Hypothyroidism    Osteoporosis    Past Surgical History:  Procedure Laterality Date   COLONOSCOPY     COLONOSCOPY WITH PROPOFOL  N/A 11/09/2017   Procedure: COLONOSCOPY WITH PROPOFOL ;  Surgeon: Gaylyn Gladis PENNER, MD;  Location: [facility] ENDOSCOPY;  Service: Endoscopy;  Laterality: N/A;   CYST EXCISION     low back   ESOPHAGOGASTRODUODENOSCOPY (EGD) WITH PROPOFOL  N/A  11/09/2017   Procedure: ESOPHAGOGASTRODUODENOSCOPY (EGD) WITH PROPOFOL ;  Surgeon: Gaylyn Gladis PENNER, MD;  Location: [facility] ENDOSCOPY;  Service: Endoscopy;  Laterality: N/A;   THYROID  SURGERY  1992   left hemi-thyroidectomy (colloid goiter0   UPPER GASTROINTESTINAL ENDOSCOPY     Family History  Problem Relation Age of Onset   Emphysema Father    Heart disease Mother        myocardial infarction (55)   Hypertension Mother    Diabetes Mother    Heart disease Brother        drug use   Diabetes Mellitus II Sister        x3   Hypertension Sister    Breast cancer Other        niece   Breast cancer Sister    Cancer Sister    Colon cancer Neg Hx    Social History   Socioeconomic History   Marital status: Married    Spouse name: Not on file   Number of children: 2   Years of education: Not on file   Highest education level: 12th grade  Occupational History   Not on file  Tobacco Use   Smoking status: Never   Smokeless tobacco: Never  Vaping Use   Vaping status: Never Used  Substance and Sexual Activity   Alcohol use: No    Alcohol/week: 0.0 standard drinks of alcohol    Comment: occasional   Drug use: Never  Sexual activity: Not on file  Other Topics Concern   Not on file  Social History Narrative   In [place]; with husband; never smoked;  no alcohol; telephone receptionist.    Social Drivers of Health   Financial Resource Strain: Low Risk  (07/02/2023)   Overall Financial Resource Strain (CARDIA)    Difficulty of Paying Living Expenses: Not hard at all  Food Insecurity: No Food Insecurity (07/02/2023)   Hunger Vital Sign    Worried About Running Out of Food in the Last Year: Never true    Ran Out of Food in the Last Year: Never true  Transportation Needs: No Transportation Needs (07/02/2023)   PRAPARE - Administrator, Civil Service (Medical): No    Lack of Transportation (Non-Medical): No  Physical Activity: Insufficiently Active (07/02/2023)    Exercise Vital Sign    Days of Exercise per Week: 3 days    Minutes of Exercise per Session: 10 min  Stress: No Stress Concern Present (07/02/2023)   [employer] of Occupational Health - Occupational Stress Questionnaire    Feeling of Stress: Not at all  Social Connections: Socially Integrated (07/02/2023)   Social Connection and Isolation Panel    Frequency of Communication with Friends and Family: More than three times a week    Frequency of Social Gatherings with Friends and Family: Twice a week    Attends Religious Services: More than 4 times per year    Active Member of [employer] or Organizations: Yes    Attends Banker Meetings: 1 to 4 times per year    Marital Status: Married     Review of Systems  Constitutional:  Positive for appetite change. Negative for fever and unexpected weight change.  HENT:  Negative for congestion and sinus pressure.   Respiratory:  Negative for cough, chest tightness and shortness of breath.   Cardiovascular:  Negative for chest pain, palpitations and leg swelling.  Gastrointestinal:  Positive for abdominal pain, nausea and vomiting. Negative for diarrhea.  Genitourinary:  Negative for difficulty urinating and dysuria.  Musculoskeletal:  Negative for joint swelling and myalgias.  Skin:  Negative for color change and rash.  Neurological:  Negative for dizziness and headaches.  Psychiatric/Behavioral:  Negative for agitation and dysphoric mood.        Objective:     BP 120/72   Pulse 89   Temp 98 F (36.7 C)   Resp 16   Ht 5' 3 (1.6 m)   Wt 124 lb 3.2 oz (56.3 kg)   SpO2 99%   BMI 22.00 kg/m  Wt Readings from Last 3 Encounters:  10/01/23 124 lb 3.2 oz (56.3 kg)  07/03/23 130 lb (59 kg)  05/31/23 125 lb 12.8 oz (57.1 kg)   Blood pressure 108/72 lying and 92/62 attempted standing/had to sit before blood pressure could be taken.   Physical Exam Vitals reviewed.  Constitutional:      Comments: Appears not to feel well.    HENT:     Head: Normocephalic and atraumatic.     Right Ear: External ear normal.     Left Ear: External ear normal.  Eyes:     General: No scleral icterus.       Right eye: No discharge.        Left eye: No discharge.     Conjunctiva/sclera: Conjunctivae normal.  Neck:     Thyroid : No thyromegaly.  Cardiovascular:     Rate and Rhythm: Normal rate and regular  rhythm.  Pulmonary:     Effort: No respiratory distress.     Breath sounds: Normal breath sounds. No wheezing.  Abdominal:     General: Bowel sounds are normal.     Palpations: Abdomen is soft.     Comments: Increased tenderness to palpation - mid abdomen, umbilical region and some LLQ tenderness.   Musculoskeletal:        General: No swelling or tenderness.     Cervical back: Neck supple. No tenderness.  Lymphadenopathy:     Cervical: No cervical adenopathy.  Skin:    Findings: No erythema or rash.  Psychiatric:        Mood and Affect: Mood normal.        Behavior: Behavior normal.         Outpatient Encounter Medications as of 10/01/2023  Medication Sig   ACCU-CHEK AVIVA PLUS test strip USE TO CHECK BLOOD SUGAR THREE TIMES A DAY   ACCU-CHEK SOFTCLIX LANCETS lancets CHECK BLOOD SUGAR ONCE A DAY DX E11.9   acetaminophen  (TYLENOL ) 500 MG tablet Take 500 mg by mouth every 6 (six) hours as needed.   acetaminophen  (TYLENOL ) 650 MG CR tablet Take 650 mg by mouth every 8 (eight) hours as needed for pain.   aspirin 81 MG tablet Take 81 mg by mouth daily.   Blood Glucose Calibration (ACCU-CHEK GUIDE CONTROL) LIQD Used to check glucometer for accurateness.   calcium -vitamin D  (OSCAL WITH D) 250-125 MG-UNIT tablet Take 1 tablet by mouth daily.   Evolocumab  (REPATHA  SURECLICK) 140 MG/ML SOAJ INJECT 140 MG INTO THE SKIN EVERY 14 (FOURTEEN) DAYS. (PA DENIED)   glipiZIDE  (GLUCOTROL  XL) 5 MG 24 hr tablet Take 1 tablet (5 mg total) by mouth daily.   JARDIANCE  25 MG TABS tablet TAKE 1 TABLET DAILY BEFORE BREAKFAST   lisinopril   (ZESTRIL ) 10 MG tablet TAKE 1 TABLET DAILY   Multiple Vitamin (MULTIVITAMIN) tablet Take 1 tablet by mouth daily.   omeprazole  (PRILOSEC) 20 MG capsule Take 1 capsule (20 mg total) by mouth daily.   traMADol  (ULTRAM ) 50 MG tablet Take 25-50 mg by mouth 2 (two) times daily as needed.   No facility-administered encounter medications on file as of 10/01/2023.     Lab Results  Component Value Date   WBC 16.4 (H) 10/01/2023   HGB 11.0 (L) 10/01/2023   HCT 35.0 (L) 10/01/2023   PLT 236 10/01/2023   GLUCOSE 187 (H) 10/01/2023   CHOL 152 09/27/2023   TRIG 55.0 09/27/2023   HDL 67.00 09/27/2023   LDLCALC 74 09/27/2023   ALT 13 10/01/2023   AST 28 10/01/2023   NA 136 10/01/2023   K 4.9 10/01/2023   CL 102 10/01/2023   CREATININE 0.99 10/01/2023   BUN 25 (H) 10/01/2023   CO2 18 (L) 10/01/2023   TSH 0.98 09/27/2023   HGBA1C 7.1 (H) 09/27/2023   MICROALBUR 2.2 (H) 09/27/2023    MM 3D SCREENING MAMMOGRAM BILATERAL BREAST Result Date: 08/22/2023 CLINICAL DATA:  Screening. EXAM: DIGITAL SCREENING BILATERAL MAMMOGRAM WITH TOMOSYNTHESIS AND CAD TECHNIQUE: Bilateral screening digital craniocaudal and mediolateral oblique mammograms were obtained. Bilateral screening digital breast tomosynthesis was performed. The images were evaluated with computer-aided detection. COMPARISON:  Previous exam(s). ACR Breast Density Category b: There are scattered areas of fibroglandular density. FINDINGS: There are no findings suspicious for malignancy. IMPRESSION: No mammographic evidence of malignancy. A result letter of this screening mammogram will be mailed directly to the patient. RECOMMENDATION: Screening mammogram in one year. (Code:SM-B-01Y) BI-RADS CATEGORY  1: Negative. Electronically Signed   By: Toribio Agreste M.D.   On: 08/22/2023 14:47       Assessment & Plan:  Hypercholesterolemia Assessment & Plan: Intolerant to statin medication.  On repatha . Cholesterol as outlined.  Low cholesterol diet and  exercise. Follow lipid panel.  Lab Results  Component Value Date   CHOL 152 09/27/2023   HDL 67.00 09/27/2023   LDLCALC 74 09/27/2023   TRIG 55.0 09/27/2023   CHOLHDL 2 09/27/2023      Left lower quadrant abdominal pain Assessment & Plan: Increased abdominal pain - mostly localized to mid abdomen around umbilicus and some minimal LLQ pain. Increased pain during office visit. Vomiting this am. No diarrhea. No fever. No chest pain or sob. Blood pressure as outlined. Not able to fully complete orthostatics. Unable to keep anything down - regurgitated water. No known sick contacts. Unclear etiology. Discussed the need for further w/up and evaluation, including labs, IVFs and CT scan. Husband and Ms Fendrick agreeable for transport to ER for evaluation.    Primary hypertension Assessment & Plan: Continue lisinopril . Also on jardiance . Discussed the need to hold jardiance  with acute vomiting and inability to keep food down.    Type 2 diabetes mellitus with hyperglycemia, without long-term current use of insulin  (HCC) Assessment & Plan: Given acute symptoms and vomiting and volume depletion, needs to hold jardiance .       Allena Hamilton, MD

## 2023-10-01 NOTE — ED Notes (Signed)
 Patient transported to CT

## 2023-10-01 NOTE — Assessment & Plan Note (Signed)
 Increased abdominal pain - mostly localized to mid abdomen around umbilicus and some minimal LLQ pain. Increased pain during office visit. Vomiting this am. No diarrhea. No fever. No chest pain or sob. Blood pressure as outlined. Not able to fully complete orthostatics. Unable to keep anything down - regurgitated water. No known sick contacts. Unclear etiology. Discussed the need for further w/up and evaluation, including labs, IVFs and CT scan. Husband and Ms Skorupski agreeable for transport to ER for evaluation.

## 2023-10-01 NOTE — Assessment & Plan Note (Signed)
 Continue lisinopril . Also on jardiance . Discussed the need to hold jardiance  with acute vomiting and inability to keep food down.

## 2023-10-01 NOTE — Anesthesia Procedure Notes (Signed)
 Procedure Name: Intubation Date/Time: 10/01/2023 6:59 PM  Performed by: Lorriane Arabia, CRNAPre-anesthesia Checklist: Patient identified, Patient being monitored, Timeout performed, Emergency Drugs available and Suction available Patient Re-evaluated:Patient Re-evaluated prior to induction Oxygen Delivery Method: Circle system utilized Preoxygenation: Pre-oxygenation with 100% oxygen Induction Type: IV induction Ventilation: Mask ventilation without difficulty Laryngoscope Size: 3 and McGrath Grade View: Grade I Tube type: Oral Tube size: 6.5 mm Number of attempts: 1 Airway Equipment and Method: Stylet Placement Confirmation: ETT inserted through vocal cords under direct vision, positive ETCO2 and breath sounds checked- equal and bilateral Secured at: 21 cm Tube secured with: Tape Dental Injury: Teeth and Oropharynx as per pre-operative assessment  Comments: Pt with anterior airway. Needed cricoid pressure and large hook to the end of the ETT

## 2023-10-01 NOTE — Anesthesia Preprocedure Evaluation (Signed)
 Anesthesia Evaluation  Patient identified by MRN, date of birth, ID band Patient awake    Reviewed: Allergy & Precautions, H&P , NPO status , Patient's Chart, lab work & pertinent test results, reviewed documented beta blocker date and time   Airway Mallampati: II  TM Distance: >3 FB Neck ROM: full    Dental  (+) Teeth Intact   Pulmonary neg pulmonary ROS   Pulmonary exam normal        Cardiovascular Exercise Tolerance: Poor hypertension, On Medications negative cardio ROS Normal cardiovascular exam Rhythm:regular Rate:Normal     Neuro/Psych  Neuromuscular disease  negative psych ROS   GI/Hepatic Neg liver ROS,GERD  Medicated,,  Endo/Other  diabetesHypothyroidism    Renal/GU negative Renal ROS  negative genitourinary   Musculoskeletal   Abdominal   Peds  Hematology  (+) Blood dyscrasia, anemia   Anesthesia Other Findings Past Medical History: No date: Anemia No date: Diabetes mellitus (HCC) No date: GERD (gastroesophageal reflux disease) No date: Hypercholesterolemia No date: Hypertension No date: Hypothyroidism No date: Osteoporosis Past Surgical History: No date: COLONOSCOPY 11/09/2017: COLONOSCOPY WITH PROPOFOL ; N/A     Comment:  Procedure: COLONOSCOPY WITH PROPOFOL ;  Surgeon:               Gaylyn Gladis PENNER, MD;  Location: [facility] ENDOSCOPY;                Service: Endoscopy;  Laterality: N/A; No date: CYST EXCISION     Comment:  low back 11/09/2017: ESOPHAGOGASTRODUODENOSCOPY (EGD) WITH PROPOFOL ; N/A     Comment:  Procedure: ESOPHAGOGASTRODUODENOSCOPY (EGD) WITH               PROPOFOL ;  Surgeon: Gaylyn Gladis PENNER, MD;  Location:               [facility] ENDOSCOPY;  Service: Endoscopy;  Laterality: N/A; 1992: THYROID  SURGERY     Comment:  left hemi-thyroidectomy (colloid goiter0 No date: UPPER GASTROINTESTINAL ENDOSCOPY   Reproductive/Obstetrics negative OB ROS                               Anesthesia Physical Anesthesia Plan  ASA: 2 and emergent  Anesthesia Plan: General ETT   Post-op Pain Management:    Induction:   PONV Risk Score and Plan:   Airway Management Planned:   Additional Equipment:   Intra-op Plan:   Post-operative Plan:   Informed Consent: I have reviewed the patients History and Physical, chart, labs and discussed the procedure including the risks, benefits and alternatives for the proposed anesthesia with the patient or authorized representative who has indicated his/her understanding and acceptance.     Dental Advisory Given  Plan Discussed with: CRNA  Anesthesia Plan Comments:         Anesthesia Quick Evaluation

## 2023-10-01 NOTE — ED Provider Notes (Addendum)
 [facility] Provider Note    Event Date/Time   First MD Initiated Contact with Patient 10/01/23 1510     (approximate)   History   Abdominal Pain   HPI  Wanda Ruiz is a 88 y.o. female with history of hypertension, diabetes who comes in with abdominal pain.  Patient was seen at the internal medicine clinic.  She was noted to have increasing abdominal pain in the mid abdomen some left lower quadrant.  Did have some vomiting this morning but denies any diarrhea.  Patient was sent over here for labs, IV fluids, CT scan for further evaluation.  She denies any chest pain, shortness of breath, falls or hitting her head or any other concerns.   Physical Exam   Triage Vital Signs: ED Triage Vitals  Encounter Vitals Group     BP 10/01/23 1153 (!) 153/81     Girls Systolic BP Percentile --      Girls Diastolic BP Percentile --      Boys Systolic BP Percentile --      Boys Diastolic BP Percentile --      Pulse Rate 10/01/23 1153 87     Resp 10/01/23 1153 18     Temp 10/01/23 1153 99 F (37.2 C)     Temp Source 10/01/23 1153 Oral     SpO2 10/01/23 1153 97 %     Weight --      Height --      Head Circumference --      Peak Flow --      Pain Score 10/01/23 1149 5     Pain Loc --      Pain Education --      Exclude from Growth Chart --     Most recent vital signs: Vitals:   10/01/23 1430 10/01/23 1500  BP: (!) 159/82 (!) 156/84  Pulse: 96 (!) 102  Resp:    Temp:    SpO2: 99% 100%     General: Awake, no distress.  CV:  Good peripheral perfusion.  Resp:  Normal effort.  Abd:  No distention.  Tender in the middle abdomen with little bit of left lower quadrant tenderness Other:     ED Results / Procedures / Treatments   Labs (all labs ordered are listed, but only abnormal results are displayed) Labs Reviewed  COMPREHENSIVE METABOLIC PANEL WITH GFR - Abnormal; Notable for the following components:      Result Value   CO2 18 (*)     Glucose, Bld 187 (*)    BUN 25 (*)    Calcium  8.8 (*)    Alkaline Phosphatase 34 (*)    GFR, Estimated 56 (*)    Anion gap 16 (*)    All other components within normal limits  CBC - Abnormal; Notable for the following components:   WBC 16.4 (*)    Hemoglobin 11.0 (*)    HCT 35.0 (*)    MCH 25.4 (*)    RDW 16.1 (*)    All other components within normal limits  LIPASE, BLOOD  URINALYSIS, ROUTINE W REFLEX MICROSCOPIC  TROPONIN I (HIGH SENSITIVITY)     EKG  My interpretation of EKG:  Normal sinus rhythm 96 that any ST elevation or T wave inversions, normal intervals  RADIOLOGY Pending   PROCEDURES:  Critical Care performed: No  .1-3 Lead EKG Interpretation  Performed by: Ernest Ronal BRAVO, MD Authorized by: Ernest Ronal BRAVO, MD     Interpretation: normal  ECG rate:  90   ECG rate assessment: normal     Rhythm: sinus rhythm     Ectopy: none     Conduction: normal      MEDICATIONS ORDERED IN ED: Medications  sodium chloride  0.9 % bolus 500 mL (has no administration in time range)  fentaNYL  (SUBLIMAZE ) injection 50 mcg (50 mcg Intravenous Given 10/01/23 1458)  ondansetron  (ZOFRAN ) injection 4 mg (4 mg Intravenous Given 10/01/23 1500)     IMPRESSION / MDM / ASSESSMENT AND PLAN / ED COURSE  I reviewed the triage vital signs and the nursing notes.   Patient's presentation is most consistent with acute presentation with potential threat to life or bodily function.   Patient comes in with concerns for vomiting, abdominal pain CT imaging ordered evaluate for perforation, obstruction or other acute pathology.  CBC does show elevated white count.  Hemoglobin is slightly low but she has had some low hemoglobins previously.  Her CMP shows low bicarb with a glucose of 187.  Patient will be getting some IV fluids.  Patient is on SGL 2 inhibitor.  Patient be handed off to oncoming team pending CT I-patient will also need repeat BMP/vbg to ensure anion gap and bicarb is resolved  and that this is in a euglycemic DKA.    The patient is on the cardiac monitor to evaluate for evidence of arrhythmia and/or significant heart rate changes.  Clinical Course as of 10/01/23 1521  Mon Oct 01, 2023  1510 Pending CT a/p. Dispo per CT [EB]    Clinical Course User Index [EB] Jossie Artist POUR, MD     FINAL CLINICAL IMPRESSION(S) / ED DIAGNOSES   Final diagnoses:  Abdominal pain, unspecified abdominal location     Rx / DC Orders   ED Discharge Orders     None        Note:  This document was prepared using Dragon voice recognition software and may include unintentional dictation errors.   Ernest Ronal BRAVO, MD 10/01/23 1528    Ernest Ronal BRAVO, MD 10/01/23 [phone]

## 2023-10-01 NOTE — Assessment & Plan Note (Signed)
 Intolerant to statin medication.  On repatha . Cholesterol as outlined.  Low cholesterol diet and exercise. Follow lipid panel.  Lab Results  Component Value Date   CHOL 152 09/27/2023   HDL 67.00 09/27/2023   LDLCALC 74 09/27/2023   TRIG 55.0 09/27/2023   CHOLHDL 2 09/27/2023

## 2023-10-01 NOTE — ED Triage Notes (Signed)
 Pt to ED via [facility] from [facility]. Pt started to have abd pain yesterday and woke her up this morning. The PCP reports pt with worsening condition on arrival and BP 90s systolic. Abdominal tender to palpation.   1st degreee AV block  250 LR  fentanyl  150/76 HR 80

## 2023-10-01 NOTE — Op Note (Signed)
 Pre-Op Dx: Closed-loop bowel obstruction Post-Op Dx: Same  Anesthesia: GETA EBL: 30 mL Complications:  none apparent Specimen: Small bowel  Procedure: Exploratory laparotomy, lysis of adhesion Surgeon: Tye   Indications for procedure: Patient with features of closed-loop bowel obstruction taken emergently to the OR.  Please see H&P for details.   Description of Procedure:  Consent obtained, time out performed.  Patient placed in supine position.  Foley placed area sterilized and draped in usual position.  Periumbilical midline incision made and dissection carried down to fascia.  Fascia then incised and peritoneum entered.     Small bowel examined and obvious closed-loop obstruction noted caused by omental adhesions creating a ring around the closed-loop.  The scar tissue ring was removed with electrocautery and the bowel was noted to be bruised but viable with visible peristalsis after a few minutes.  Remaining bowel examined and no further concerns noted.  Bowel contents then reduced back into the abdominal cavity.  Local infused around the incision and 1-0 PDS x2 was then used to close the midline incision.  skin approximated using interrupted 3-0 Vicryl.  Skin then closed with staples.  Wound then dressed with Prevena wound VAC  Pt tolerated procedure well, and transferred to PACU in stable condition, NG in place.  Sponge and instrument count correct at end of procedure.

## 2023-10-01 NOTE — H&P (Signed)
 Subjective:   CC: Closed-loop bowel obstruction  HPI:  Wanda Ruiz is a 88 y.o. female who is consulted by Jossie for evaluation of above cc.  Symptoms were first noted this a.m.  ago. Pain is sharp, periumbilical associated with nausea vomiting, exacerbated by nothing specific     Past Medical History:  has a past medical history of Anemia, Diabetes mellitus (HCC), GERD (gastroesophageal reflux disease), Hypercholesterolemia, Hypertension, Hypothyroidism, and Osteoporosis.  Past Surgical History:  has a past surgical history that includes Thyroid  surgery (1992); Cyst excision; Colonoscopy; Upper gastrointestinal endoscopy; Colonoscopy with propofol  (N/A, 11/09/2017); and Esophagogastroduodenoscopy (egd) with propofol  (N/A, 11/09/2017).  Family History: family history includes Breast cancer in her sister and another family member; Cancer in her sister; Diabetes in her mother; Diabetes Mellitus II in her sister; Emphysema in her father; Heart disease in her brother and mother; Hypertension in her mother and sister.  Social History:  reports that she has never smoked. She has never used smokeless tobacco. She reports that she does not drink alcohol and does not use drugs.  Current Medications:  Prior to Admission medications   Medication Sig Start Date End Date Taking? Authorizing Provider  ACCU-CHEK AVIVA PLUS test strip USE TO CHECK BLOOD SUGAR THREE TIMES A DAY 11/20/22   Glendia Shad, MD  ACCU-CHEK SOFTCLIX LANCETS lancets CHECK BLOOD SUGAR ONCE A DAY DX E11.9 10/30/16   [provider]  acetaminophen  (TYLENOL ) 500 MG tablet Take 500 mg by mouth every 6 (six) hours as needed.    [provider]  acetaminophen  (TYLENOL ) 650 MG CR tablet Take 650 mg by mouth every 8 (eight) hours as needed for pain.    [provider]  aspirin 81 MG tablet Take 81 mg by mouth daily.    [provider]  Blood Glucose Calibration (ACCU-CHEK GUIDE CONTROL) LIQD Used to  check glucometer for accurateness. 05/04/21   Glendia Shad, MD  calcium -vitamin D  (OSCAL WITH D) 250-125 MG-UNIT tablet Take 1 tablet by mouth daily.    [provider]  Evolocumab  (REPATHA  SURECLICK) 140 MG/ML SOAJ INJECT 140 MG INTO THE SKIN EVERY 14 (FOURTEEN) DAYS. (PA DENIED) 05/31/23   Glendia Shad, MD  glipiZIDE  (GLUCOTROL  XL) 5 MG 24 hr tablet Take 1 tablet (5 mg total) by mouth daily. 01/29/23   Glendia Shad, MD  JARDIANCE  25 MG TABS tablet TAKE 1 TABLET DAILY BEFORE BREAKFAST 01/02/23   Glendia Shad, MD  lisinopril  (ZESTRIL ) 10 MG tablet TAKE 1 TABLET DAILY 01/02/23   Glendia Shad, MD  Multiple Vitamin (MULTIVITAMIN) tablet Take 1 tablet by mouth daily.    [provider]  omeprazole  (PRILOSEC) 20 MG capsule Take 1 capsule (20 mg total) by mouth daily. 01/29/23   Glendia Shad, MD  traMADol  (ULTRAM ) 50 MG tablet Take 25-50 mg by mouth 2 (two) times daily as needed.    [provider]    Allergies:  Allergies as of 10/01/2023   (No Known Allergies)    ROS:  Pertinent positives and negatives noted in HPI   Objective:     BP (!) 163/82   Pulse (!) 104   Temp 99.7 F (37.6 C)   Resp 18   SpO2 100%    Constitutional :  alert, cooperative, appears stated age, and mild distress  Respiratory:  Clear to auscultation bilaterally  Cardiovascular:  Regular rate and rhythm  Gastrointestinal: Soft, voluntary guarding with tenderness to palpation periumbilical area.   Skin: Cool and moist  Psychiatric: Normal affect, non-agitated,  not confused       LABS:     Latest Ref Rng & Units 10/01/2023   11:52 AM 09/27/2023    8:58 AM 05/29/2023    9:12 AM  CMP  Glucose 70 - 99 mg/dL 812  895  893   BUN 8 - 23 mg/dL 25  18  24    Creatinine 0.44 - 1.00 mg/dL 9.00  9.21  9.12   Sodium 135 - 145 mmol/L 136  138  137   Potassium 3.5 - 5.1 mmol/L 4.9  4.3  4.4   Chloride 98 - 111 mmol/L 102  104  105   CO2 22 - 32 mmol/L 18  27  24    Calcium  8.9 -  10.3 mg/dL 8.8  9.5  9.0   Total Protein 6.5 - 8.1 g/dL 7.2  7.3  7.2   Total Bilirubin 0.0 - 1.2 mg/dL 1.0  0.5  0.4   Alkaline Phos 38 - 126 U/L 34  41  33   AST 15 - 41 U/L 28  15  17    ALT 0 - 44 U/L 13  11  10        Latest Ref Rng & Units 10/01/2023   11:52 AM 05/31/2023    2:44 PM 05/29/2023    9:12 AM  CBC  WBC 4.0 - 10.5 K/uL 16.4  7.9  6.0   Hemoglobin 12.0 - 15.0 g/dL 88.9  87.6  89.1   Hematocrit 36.0 - 46.0 % 35.0  38.7  34.0   Platelets 150 - 400 K/uL 236  270.0  232.0      RADS: CLINICAL DATA:  Acute abdominal pain.   EXAM: CT ABDOMEN AND PELVIS WITH CONTRAST   TECHNIQUE: Multidetector CT imaging of the abdomen and pelvis was performed using the standard protocol following bolus administration of intravenous contrast.   RADIATION DOSE REDUCTION: This exam was performed according to the departmental dose-optimization program which includes automated exposure control, adjustment of the mA and/or kV according to patient size and/or use of iterative reconstruction technique.   CONTRAST:  100mL OMNIPAQUE  IOHEXOL  300 MG/ML  SOLN   COMPARISON:  None Available.   FINDINGS: Lower chest: No acute abnormality.   Hepatobiliary: No focal liver abnormality is seen. No gallstones, gallbladder wall thickening, or biliary dilatation.   Pancreas: Unremarkable. No pancreatic ductal dilatation or surrounding inflammatory changes.   Spleen: Normal in size without focal abnormality.   Adrenals/Urinary Tract: There is a punctate nonobstructing left renal calculus. There are rounded hypodensities in the kidneys which are too small to characterize, likely cysts. There is no hydronephrosis or perinephric stranding. The adrenal glands are within normal limits. The bladder is distended, but otherwise within normal limits.   Stomach/Bowel: There is a moderate air-fluid level in the stomach. There is a loop of bowel in the left lower quadrant with mesenteric edema and  proximal/distal transition point in the same location on coronal image 5/30. Findings are worrisome for internal hernia with closed loop obstruction.   Additionally, small-bowel loops proximal to this level are dilated measuring up to 3.2 cm with some air-fluid levels. There is no pneumatosis or free air. Colon and stomach are within normal limits.   Vascular/Lymphatic: Aortic atherosclerosis. No enlarged abdominal or pelvic lymph nodes.   Reproductive: Multiple uterine fibroids are present measuring up to 3.7 cm. Adnexa are within normal limits.   Other: There is trace free fluid in the right upper quadrant and pelvis. There is no abdominal wall hernia.  Musculoskeletal: There is mild chronic compression deformity of L2. Multilevel degenerative changes affect the spine. No acute fractures are seen.   IMPRESSION: 1. Findings worrisome for left lower quadrant internal hernia with closed loop small-bowel obstruction. Mesenteric edema is present which can be seen with vascular compromise/strangulated or incarcerated hernia. No pneumatosis or free air. Surgical consultation recommended. 2. Trace free fluid in the abdomen and pelvis. 3. Nonobstructing left renal calculus. 4. Uterine fibroids. 5. Aortic atherosclerosis.   Aortic Atherosclerosis (ICD10-I70.0).   Electronically Signed: By: Greig Pique M.D. On: 10/01/2023 16:49  Assessment:   Closed-loop bowel obstruction secondary to likely internal hernia  Plan:     Discussed pathophisiology and need for emergent surgery for bowel compromise.  The risk of surgery include, but not limited to, recurrence, bleeding, chronic pain, post-op infxn, post-op SBO or ileus, hernias, resection of bowel, re-anastamosis, possible ostomy placement and need for re-operation to address said risks. The risks of general anesthetic, if used, includes MI, CVA, sudden death or even reaction to anesthetic medications also discussed. Alternatives  include none.  Benefits include possible symptom relief, preventing further decline in health and possible death.  Typical post-op recovery time of additional days in hospital for observation afterwards also discussed.  The patient verbalized understanding and all questions were answered to the patient's satisfaction.  Full code with husband as part of attorney.  Will proceed with the OR emergently.  Notified of consult at 1658.  Patient seen and discussed plan at 1745  labs/images/medications/previous chart entries reviewed personally and relevant changes/updates noted above.

## 2023-10-01 NOTE — ED Notes (Signed)
 At this time, this EDT got a 12 lead EKG. Pt is now resting in bed at lowest position, call light within reach.

## 2023-10-01 NOTE — Assessment & Plan Note (Signed)
 Given acute symptoms and vomiting and volume depletion, needs to hold jardiance .

## 2023-10-02 ENCOUNTER — Encounter: Payer: Self-pay | Admitting: Surgery

## 2023-10-02 LAB — CBC
HCT: 34.3 % — ABNORMAL LOW (ref 36.0–46.0)
Hemoglobin: 11 g/dL — ABNORMAL LOW (ref 12.0–15.0)
MCH: 25.2 pg — ABNORMAL LOW (ref 26.0–34.0)
MCHC: 32.1 g/dL (ref 30.0–36.0)
MCV: 78.7 fL — ABNORMAL LOW (ref 80.0–100.0)
Platelets: 259 K/uL (ref 150–400)
RBC: 4.36 MIL/uL (ref 3.87–5.11)
RDW: 16 % — ABNORMAL HIGH (ref 11.5–15.5)
WBC: 24.9 K/uL — ABNORMAL HIGH (ref 4.0–10.5)
nRBC: 0 % (ref 0.0–0.2)

## 2023-10-02 LAB — GLUCOSE, CAPILLARY
Glucose-Capillary: 153 mg/dL — ABNORMAL HIGH (ref 70–99)
Glucose-Capillary: 151 mg/dL — ABNORMAL HIGH (ref 70–99)
Glucose-Capillary: 114 mg/dL — ABNORMAL HIGH (ref 70–99)
Glucose-Capillary: 99 mg/dL (ref 70–99)
Glucose-Capillary: 87 mg/dL (ref 70–99)

## 2023-10-02 LAB — BASIC METABOLIC PANEL WITH GFR
Anion gap: 15 (ref 5–15)
BUN: 28 mg/dL — ABNORMAL HIGH (ref 8–23)
CO2: 19 mmol/L — ABNORMAL LOW (ref 22–32)
Calcium: 8.3 mg/dL — ABNORMAL LOW (ref 8.9–10.3)
Chloride: 105 mmol/L (ref 98–111)
Creatinine, Ser: 1.21 mg/dL — ABNORMAL HIGH (ref 0.44–1.00)
GFR, Estimated: 44 mL/min — ABNORMAL LOW (ref >60)
Glucose, Bld: 143 mg/dL — ABNORMAL HIGH (ref 70–99)
Potassium: 3.8 mmol/L (ref 3.5–5.1)
Sodium: 139 mmol/L (ref 135–145)

## 2023-10-02 MED ORDER — ENOXAPARIN SODIUM 30 MG/0.3ML IJ SOSY
30.0000 mg | PREFILLED_SYRINGE | INTRAMUSCULAR | Status: DC
  Administered 2023-10-03: 30 mg via SUBCUTANEOUS
  Filled 2023-10-02: qty 0.3

## 2023-10-02 NOTE — TOC CM/SW Note (Signed)
 Transition of Care [facility]) - Inpatient Brief Assessment   Patient Details  Name: Wanda Ruiz MRN: [id number] Date of Birth: 1935-04-19  Transition of Care [facility]) CM/SW Contact:    Alfonso Rummer, LCSW Phone Number: 10/02/2023, 9:26 AM   Clinical Narrative:  KEN DELENA Rummer completed chart review. TOC needs not identified. Please contact TOC should needs arise.   Transition of Care Asessment:

## 2023-10-02 NOTE — Progress Notes (Signed)
 [facility]- General Surgery  SURGICAL PROGRESS NOTE  Hospital Day(s): 1.   Post op day(s): 1 Day Post-Op.   Interval History:  Patient is s/p day 1 of exploratory laparotomy.  Overall appears comfortable during encounter.  Admits to having abdominal discomfort near incision. Denies any nausea or vomiting. Denies any flatulence or bowel movement. Slight increase in creatinine level to 1.21.   Vital signs in last 24 hours: [min-max] current  Temp:  [98 F (36.7 C)-100.5 F (38.1 C)] 98 F (36.7 C) (09/23 0818) Pulse Rate:  [59-116] 92 (09/23 1047) Resp:  [13-23] 18 (09/23 0818) BP: (107-163)/(54-84) 107/58 (09/23 1047) SpO2:  [97 %-100 %] 97 % (09/23 0818)             Intake/Output last 2 shifts:  09/22 0701 - 09/23 0700 In: 850 [I.V.:800; IV Piggyback:50] Out: 350 [Urine:325; Blood:25]   Physical Exam:  Constitutional: alert, cooperative and no distress  Respiratory: breathing non-labored at rest  Cardiovascular: regular rate and sinus rhythm  Gastrointestinal: soft, mildly tender near incision, and non-distended, prevena vac intact   Labs:     Latest Ref Rng & Units 10/02/2023    4:57 AM 10/01/2023   11:52 AM 05/31/2023    2:44 PM  CBC  WBC 4.0 - 10.5 K/uL 24.9  16.4  7.9   Hemoglobin 12.0 - 15.0 g/dL 88.9  88.9  87.6   Hematocrit 36.0 - 46.0 % 34.3  35.0  38.7   Platelets 150 - 400 K/uL 259  236  270.0       Latest Ref Rng & Units 10/02/2023    4:57 AM 10/01/2023   11:52 AM 09/27/2023    8:58 AM  CMP  Glucose 70 - 99 mg/dL 856  812  895   BUN 8 - 23 mg/dL 28  25  18    Creatinine 0.44 - 1.00 mg/dL 8.78  9.00  9.21   Sodium 135 - 145 mmol/L 139  136  138   Potassium 3.5 - 5.1 mmol/L 3.8  4.9  4.3   Chloride 98 - 111 mmol/L 105  102  104   CO2 22 - 32 mmol/L 19  18  27    Calcium  8.9 - 10.3 mg/dL 8.3  8.8  9.5   Total Protein 6.5 - 8.1 g/dL  7.2  7.3   Total Bilirubin 0.0 - 1.2 mg/dL  1.0  0.5   Alkaline Phos 38 - 126 U/L  34  41   AST 15 - 41 U/L  28  15    ALT 0 - 44 U/L  13  11     Imaging studies: No new pertinent imaging studies   Assessment/Plan:  88 y.o. female with closed loop bowel obstruction 1 Day Post-Op s/p exploratory laparotomy, lysis of adhesions ,complicated by pertinent comorbidities including type 2 diabetes mellitus, hypertension, hypercholesteremia, hypothyroidism, and history of aortic atherosclerosis.   - Stable vital signs, no fever not tachycardic  - Increasing leukocytosis 16.4 >>24.9, continue to closely monitor.  - Discussed the importance of NG tube and NPO until return of GI function.  Continue NG tube at low-intermittent suction  - Slight increase in creatinine levels, increased IV fluids to 100 mL/hr  - Encouraged to ambulate  - Continue pain medication as needed  - Continue DVT prophylaxis with  Lovenox   -- Deran Barro Barrientos PA-C

## 2023-10-02 NOTE — Transfer of Care (Signed)
 Immediate Anesthesia Transfer of Care Note  Patient: Wanda Ruiz  Procedure(s) Performed: LAPAROTOMY, EXPLORATORY (Abdomen)  Patient Location: PACU  Anesthesia Type:General  Level of Consciousness: drowsy and patient cooperative  Airway & Oxygen Therapy: Patient Spontanous Breathing and Patient connected to face mask oxygen  Post-op Assessment: Report given to RN and Post -op Vital signs reviewed and stable  Post vital signs: Reviewed and stable  Last Vitals:  Vitals Value Taken Time  BP 143/68 10/02/23 19:53  Temp 38.1 C 10/02/23 19:53  Pulse 100 10/02/23 19:53  Resp 20 10/02/23 19:53  SpO2 96 % 10/02/23 19:53    Last Pain:  Vitals:   10/02/23 1953  TempSrc: Oral  PainSc:          Complications: No notable events documented.

## 2023-10-03 ENCOUNTER — Inpatient Hospital Stay

## 2023-10-03 LAB — BASIC METABOLIC PANEL WITH GFR
Anion gap: 12 (ref 5–15)
BUN: 25 mg/dL — ABNORMAL HIGH (ref 8–23)
CO2: 20 mmol/L — ABNORMAL LOW (ref 22–32)
Calcium: 8.1 mg/dL — ABNORMAL LOW (ref 8.9–10.3)
Chloride: 109 mmol/L (ref 98–111)
Creatinine, Ser: 0.94 mg/dL (ref 0.44–1.00)
GFR, Estimated: 59 mL/min — ABNORMAL LOW (ref >60)
Glucose, Bld: 91 mg/dL (ref 70–99)
Potassium: 3.8 mmol/L (ref 3.5–5.1)
Sodium: 141 mmol/L (ref 135–145)

## 2023-10-03 LAB — URINALYSIS, ROUTINE W REFLEX MICROSCOPIC
Bacteria, UA: NONE SEEN
Bilirubin Urine: NEGATIVE
Glucose, UA: >=500 mg/dL — AB
Ketones, ur: 20 mg/dL — AB
Leukocytes,Ua: NEGATIVE
Nitrite: NEGATIVE
Protein, ur: NEGATIVE mg/dL
Specific Gravity, Urine: 1.023 (ref 1.005–1.030)
pH: 5 (ref 5.0–8.0)

## 2023-10-03 LAB — GLUCOSE, CAPILLARY
Glucose-Capillary: 96 mg/dL (ref 70–99)
Glucose-Capillary: 79 mg/dL (ref 70–99)
Glucose-Capillary: 205 mg/dL — ABNORMAL HIGH (ref 70–99)

## 2023-10-03 LAB — CBC
HCT: 32.8 % — ABNORMAL LOW (ref 36.0–46.0)
Hemoglobin: 10.5 g/dL — ABNORMAL LOW (ref 12.0–15.0)
MCH: 25.2 pg — ABNORMAL LOW (ref 26.0–34.0)
MCHC: 32 g/dL (ref 30.0–36.0)
MCV: 78.7 fL — ABNORMAL LOW (ref 80.0–100.0)
Platelets: 240 K/uL (ref 150–400)
RBC: 4.17 MIL/uL (ref 3.87–5.11)
RDW: 16.2 % — ABNORMAL HIGH (ref 11.5–15.5)
WBC: 16.4 K/uL — ABNORMAL HIGH (ref 4.0–10.5)
nRBC: 0 % (ref 0.0–0.2)

## 2023-10-03 MED ORDER — DIATRIZOATE MEGLUMINE & SODIUM 66-10 % PO SOLN
90.0000 mL | Freq: Once | ORAL | Status: AC
  Administered 2023-10-03: 90 mL via NASOGASTRIC

## 2023-10-03 MED ORDER — ENOXAPARIN SODIUM 40 MG/0.4ML IJ SOSY
40.0000 mg | PREFILLED_SYRINGE | INTRAMUSCULAR | Status: DC
  Administered 2023-10-04 – 2023-10-05 (×2): 40 mg via SUBCUTANEOUS
  Filled 2023-10-03 (×2): qty 0.4

## 2023-10-03 MED ORDER — SODIUM CHLORIDE 0.9 % IV SOLN: INTRAVENOUS | Status: AC

## 2023-10-03 NOTE — Care Management Important Message (Signed)
 Important Message  Patient Details  Name: Wanda Ruiz MRN: [id number] Date of Birth: 03-19-1935   Important Message Given:  Yes - Medicare IM     Wanda Ruiz 10/03/2023, 3:25 PM

## 2023-10-03 NOTE — Progress Notes (Signed)
 [facility]- General Surgery  SURGICAL PROGRESS NOTE  Hospital Day(s): 2.   Post op day(s): 2 Days Post-Op.   Interval History:  Patient seen and examined. No acute events or new complaints overnight.  Patient reports having some abdominal pain. Denies experiencing flatulence or having a bowel movement. Denies any nausea or vomiting. No change from yesterday. Recorded NG output in the last 24 hours was 365 cc.   Vital signs in last 24 hours: [min-max] current  Temp:  [98.9 F (37.2 C)-100.5 F (38.1 C)] 99.4 F (37.4 C) (09/24 0744) Pulse Rate:  [91-100] 98 (09/24 0745) Resp:  [16-20] 16 (09/24 0744) BP: (114-143)/(60-68) 123/60 (09/24 0744) SpO2:  [94 %-97 %] 97 % (09/24 0745)             Intake/Output last 2 shifts:  09/23 0701 - 09/24 0700 In: 559.8 [I.V.:559.8] Out: 865 [Urine:500; Emesis/NG output:365]   Physical Exam:  Constitutional: alert, cooperative and no distress  Respiratory: breathing non-labored at rest  Cardiovascular: regular rate and sinus rhythm  Gastrointestinal: soft, tender near midline incision, and non-distended, Prevena VAC intact  Labs:     Latest Ref Rng & Units 10/03/2023    5:48 AM 10/02/2023    4:57 AM 10/01/2023   11:52 AM  CBC  WBC 4.0 - 10.5 K/uL 16.4  24.9  16.4   Hemoglobin 12.0 - 15.0 g/dL 89.4  88.9  88.9   Hematocrit 36.0 - 46.0 % 32.8  34.3  35.0   Platelets 150 - 400 K/uL 240  259  236       Latest Ref Rng & Units 10/03/2023    5:48 AM 10/02/2023    4:57 AM 10/01/2023   11:52 AM  CMP  Glucose 70 - 99 mg/dL 91  856  812   BUN 8 - 23 mg/dL 25  28  25    Creatinine 0.44 - 1.00 mg/dL 9.05  8.78  9.00   Sodium 135 - 145 mmol/L 141  139  136   Potassium 3.5 - 5.1 mmol/L 3.8  3.8  4.9   Chloride 98 - 111 mmol/L 109  105  102   CO2 22 - 32 mmol/L 20  19  18    Calcium  8.9 - 10.3 mg/dL 8.1  8.3  8.8   Total Protein 6.5 - 8.1 g/dL   7.2   Total Bilirubin 0.0 - 1.2 mg/dL   1.0   Alkaline Phos 38 - 126 U/L   34   AST 15 - 41 U/L    28   ALT 0 - 44 U/L   13     Imaging studies: No new pertinent imaging studies   Assessment/Plan:  88 y.o. female with closed-loop bowel obstruction 2 Days Post-Op s/p exploratory laparotomy, lysis of adhesions ,complicated by pertinent comorbidities including type 2 diabetes mellitus, hypertension, hypercholesteremia, hypothyroidism, and history of aortic atherosclerosis.    - Leukocytosis improving 24.9 >> 16.4   - No return of GI function.  Ordered Gastrografin  challenge.  Will follow progress with abdominal x-ray in about 8 hours.  - Continue NG tube at low-intermittent suction.  Continue NPO  - Creatinine levels have normalized  - Continue IV fluids to 50 mL an hour  - Encouraged to continue to ambulate  - Continue pain medication as needed  -- Tony Granquist Barrientos PA-C

## 2023-10-03 NOTE — TOC CM/SW Note (Signed)
 Transition of Care [facility]) - Inpatient Brief Assessment   Patient Details  Name: Wanda Ruiz MRN: [id number] Date of Birth: 06-20-1935  Transition of Care [facility]) CM/SW Contact:    Alfonso Rummer, LCSW Phone Number: 10/03/2023, 9:38 AM   Clinical Narrative: KEN DELENA Rummer completed TOC chart view. No TOC needs identified. Should need arise please contact TOC    Transition of Care Asessment:

## 2023-10-04 LAB — CBC
HCT: 35.1 % — ABNORMAL LOW (ref 36.0–46.0)
Hemoglobin: 11.1 g/dL — ABNORMAL LOW (ref 12.0–15.0)
MCH: 25.1 pg — ABNORMAL LOW (ref 26.0–34.0)
MCHC: 31.6 g/dL (ref 30.0–36.0)
MCV: 79.2 fL — ABNORMAL LOW (ref 80.0–100.0)
Platelets: 248 K/uL (ref 150–400)
RBC: 4.43 MIL/uL (ref 3.87–5.11)
RDW: 16.1 % — ABNORMAL HIGH (ref 11.5–15.5)
WBC: 13.9 K/uL — ABNORMAL HIGH (ref 4.0–10.5)
nRBC: 0 % (ref 0.0–0.2)

## 2023-10-04 LAB — BASIC METABOLIC PANEL WITH GFR
Anion gap: 15 (ref 5–15)
BUN: 17 mg/dL (ref 8–23)
CO2: 21 mmol/L — ABNORMAL LOW (ref 22–32)
Calcium: 8.4 mg/dL — ABNORMAL LOW (ref 8.9–10.3)
Chloride: 107 mmol/L (ref 98–111)
Creatinine, Ser: 0.79 mg/dL (ref 0.44–1.00)
GFR, Estimated: >60 mL/min (ref >60)
Glucose, Bld: 95 mg/dL (ref 70–99)
Potassium: 4.1 mmol/L (ref 3.5–5.1)
Sodium: 143 mmol/L (ref 135–145)

## 2023-10-04 LAB — GLUCOSE, CAPILLARY
Glucose-Capillary: 86 mg/dL (ref 70–99)
Glucose-Capillary: 96 mg/dL (ref 70–99)
Glucose-Capillary: 186 mg/dL — ABNORMAL HIGH (ref 70–99)
Glucose-Capillary: 154 mg/dL — ABNORMAL HIGH (ref 70–99)
Glucose-Capillary: 108 mg/dL — ABNORMAL HIGH (ref 70–99)

## 2023-10-04 MED ORDER — PANTOPRAZOLE SODIUM 40 MG PO TBEC
40.0000 mg | DELAYED_RELEASE_TABLET | Freq: Every day | ORAL | Status: DC
  Administered 2023-10-04 – 2023-10-05 (×2): 40 mg via ORAL
  Filled 2023-10-04 (×2): qty 1

## 2023-10-04 NOTE — Plan of Care (Signed)

## 2023-10-04 NOTE — Progress Notes (Cosign Needed Addendum)
 [facility]- General Surgery  SURGICAL PROGRESS NOTE  Hospital Day(s): 3.   Post op day(s): 3 Days Post-Op.   Interval History:  Patient is clinically improving. Had multiple bowel movements yesterday. Abdominal x-ray showed contrast in colon. She was started on clear liquids last night and had NGT removed.  States she has been tolerating clear liquid diet.  Denies any nausea or vomiting. Abdominal pain is aggravated with movement. Otherwise, pain is controlled. Has been ambulating with minimal discomfort.   Vital signs in last 24 hours: [min-max] current  Temp:  [98.4 F (36.9 C)-99.4 F (37.4 C)] 98.9 F (37.2 C) (09/25 0803) Pulse Rate:  [84-100] 84 (09/25 0803) Resp:  [16-17] 17 (09/25 0803) BP: (125-136)/(67-75) 125/67 (09/25 0803) SpO2:  [94 %-98 %] 97 % (09/25 0803)             Intake/Output last 2 shifts:  09/24 0701 - 09/25 0700 In: 893 [P.O.:600; I.V.:293] Out: 300 [Urine:300]   Physical Exam:  Constitutional: alert, cooperative and no distress  Respiratory: breathing non-labored at rest  Cardiovascular: regular rate and sinus rhythm  Gastrointestinal: soft, tender near suprapubic area, and non-distended, Prevena VAC intact  Labs:     Latest Ref Rng & Units 10/04/2023    6:18 AM 10/03/2023    5:48 AM 10/02/2023    4:57 AM  CBC  WBC 4.0 - 10.5 K/uL 13.9  16.4  24.9   Hemoglobin 12.0 - 15.0 g/dL 88.8  89.4  88.9   Hematocrit 36.0 - 46.0 % 35.1  32.8  34.3   Platelets 150 - 400 K/uL 248  240  259       Latest Ref Rng & Units 10/04/2023    6:18 AM 10/03/2023    5:48 AM 10/02/2023    4:57 AM  CMP  Glucose 70 - 99 mg/dL 95  91  856   BUN 8 - 23 mg/dL 17  25  28    Creatinine 0.44 - 1.00 mg/dL 9.20  9.05  8.78   Sodium 135 - 145 mmol/L 143  141  139   Potassium 3.5 - 5.1 mmol/L 4.1  3.8  3.8   Chloride 98 - 111 mmol/L 107  109  105   CO2 22 - 32 mmol/L 21  20  19    Calcium  8.9 - 10.3 mg/dL 8.4  8.1  8.3     Imaging studies:  CLINICAL DATA:  Small bowel  obstruction   EXAM:  PORTABLE ABDOMEN - 1 VIEW   COMPARISON:  10/01/2023   FINDINGS: NG tube is in the stomach. Oral contrast material noted throughout the colon. Mild central bowel loop prominence, difficult to determine if this is within large or small bowel. No free air or organomegaly.   IMPRESSION: Oral contrast material seen throughout the colon.   NG tube in the stomach.     Electronically Signed   By: Franky Crease M.D.   On: 10/03/2023 21:33  Assessment/Plan:  88 y.o. female with closed loop bowel obstruction 3 Days Post-Op s/p exploratory laparotomy, lysis of adhesions ,complicated by pertinent comorbidities including type 2 diabetes mellitus, hypertension, hypercholesteremia, hypothyroidism, and history of aortic atherosclerosis.    - Stable vital signs, no fever not tachycardic  - Leukocytosis improving 16.4 >> 13.9  - Tolerating clear liquid diet, advance to full liquid diet for lunch and possibly soft diet for dinner. Possible discharge tomorrow.  - Added Protonix  40 mg once daily for acid reflux.  - Continue to ambulate  -  Continue pain medication as needed  -- [employer] PA-C

## 2023-10-05 LAB — CBC
HCT: 32.2 % — ABNORMAL LOW (ref 36.0–46.0)
Hemoglobin: 10.3 g/dL — ABNORMAL LOW (ref 12.0–15.0)
MCH: 25.4 pg — ABNORMAL LOW (ref 26.0–34.0)
MCHC: 32 g/dL (ref 30.0–36.0)
MCV: 79.3 fL — ABNORMAL LOW (ref 80.0–100.0)
Platelets: 232 K/uL (ref 150–400)
RBC: 4.06 MIL/uL (ref 3.87–5.11)
RDW: 16 % — ABNORMAL HIGH (ref 11.5–15.5)
WBC: 8.1 K/uL (ref 4.0–10.5)
nRBC: 0 % (ref 0.0–0.2)

## 2023-10-05 LAB — BASIC METABOLIC PANEL WITH GFR
Anion gap: 13 (ref 5–15)
BUN: 14 mg/dL (ref 8–23)
CO2: 22 mmol/L (ref 22–32)
Calcium: 8.2 mg/dL — ABNORMAL LOW (ref 8.9–10.3)
Chloride: 106 mmol/L (ref 98–111)
Creatinine, Ser: 0.7 mg/dL (ref 0.44–1.00)
GFR, Estimated: >60 mL/min (ref >60)
Glucose, Bld: 92 mg/dL (ref 70–99)
Potassium: 4.2 mmol/L (ref 3.5–5.1)
Sodium: 141 mmol/L (ref 135–145)

## 2023-10-05 LAB — GLUCOSE, CAPILLARY: Glucose-Capillary: 89 mg/dL (ref 70–99)

## 2023-10-05 MED ORDER — IBUPROFEN 800 MG PO TABS
800.0000 mg | ORAL_TABLET | Freq: Three times a day (TID) | ORAL | 0 refills | Status: AC | PRN
Start: 2023-10-05 — End: 2023-10-15

## 2023-10-05 NOTE — Plan of Care (Signed)

## 2023-10-05 NOTE — Discharge Instructions (Addendum)
  Diet: Resume home heart healthy regular diet.   Activity: No heavy lifting >20 pounds (children, pets, laundry, garbage) or strenuous activity until follow-up, but light activity and walking are encouraged. Do not drive or drink alcohol if taking narcotic pain medications.  Wound care: May shower with soapy water and pat dry (do not rub incisions), but no baths or submerging incision underwater until follow-up. (no swimming)   Medications: Resume all home medications. For mild to moderate pain: acetaminophen  (Tylenol ) or ibuprofen  (if no kidney disease). Combining Tylenol  with alcohol can substantially increase your risk of causing liver disease. Narcotic pain medications, if prescribed, can be used for severe pain, though may cause nausea, constipation, and drowsiness.  Tramadol  is a narcotic pain medication.  You can take that as needed for severe pain.  Call office [phone]) at any time if any questions, worsening pain, fevers/chills, bleeding, drainage from incision site, or other concerns.

## 2023-10-05 NOTE — Discharge Summary (Signed)
 [facility]-General Surgery  SURGICAL DISCHARGE SUMMARY  [name] MRN: [id number] DOB/AGE: [birth year] 86 y.o.  Admit date: 10/01/2023 Discharge date: 10/05/2023  Discharge Diagnoses Patient Active Problem List   Diagnosis Date Noted   Internal hernia 10/01/2023   Tinnitus 06/04/2023   Low TSH level 05/31/2023   Lumbar radiculopathy 01/29/2023   Foot pain, right 07/17/2021   Nasal congestion 06/21/2021   Statin myopathy 03/26/2021   Compression fracture of body of thoracic vertebra (HCC) 12/26/2020   Anemia 12/23/2020   Ear pain, left 08/30/2020   Aortic atherosclerosis 08/30/2020   Hyperkalemia 08/24/2020   Side pain 01/18/2020   Neck fullness 06/06/2019   Cerumen impaction 04/05/2019   Stress 10/27/2018   Abdominal pain 07/26/2016   Numbness and tingling 04/02/2016   Health care maintenance 02/15/2014   Leg pain 10/14/2013   Type 2 diabetes mellitus with hyperglycemia (HCC) 01/01/2012   Hypertension 01/01/2012   Hypercholesterolemia 01/01/2012   Hypothyroidism 01/01/2012   Osteopenia 01/01/2012   Microcytic anemia 01/01/2012    Consultants None  Procedures Exploratory laparotomy, lysis of adhesion    Hospital Course:  Patient presented to the [facility] ED on 10/01/2023 with sharp, periumbilical pain associated with nausea and vomiting. In the ED, patient was hypertensive with a BP of 153/81. Otherwise stable vital signs.  Labs indicated leukocytosis 16.4.  CT of abdomen pelvis showed left lower quadrant tender hernia was closely small bowel obstruction, with mesenteric edema concerning for strangulated or incarcerated hernia. No free air. Patient was taken to the operating room later that evening on 10/01/2023. Surgery went well. Patient tolerated procedure. Closed-loop obstruction was caused by omental adhesions. Small bowel was viable with visible peristalsis after a few minutes of lysis of adhesions. No resection was necessary. Ordered gastrografin   challenge on 10/03/23. Abdominal x-ray showed contrast throughout the colon.  Patient started passing gas and had a bowel movement the same day. NG tube was removed and was slowly advance to soft diet. Patient is now tolerating soft diet post-op and is ambulating with minimal pain. Prevena VAC was removed this morning. Pain has remained minimal aggravated with movement, no worsening pain. Patient is clear from surgical standpoint.  Patient will follow-up outpatient with Dr. Tye in 2 weeks.     Physical Examination:  Constitutional: alert, in no acute distress Pulmonary: CTA bilaterally, normal breath sounds Cardiac: regular rate and rhythm Gastrointestinal: soft, non-tender, and non-distended Skin: Prevena VAC was removed. Skin staples are intact. Midline incision is clean and dry. No drainage or other signs of infection.    Allergies as of 10/05/2023   No Known Allergies      Medication List     TAKE these medications    Accu-Chek Aviva Plus test strip Generic drug: glucose blood USE TO CHECK BLOOD SUGAR THREE TIMES A DAY   Accu-Chek Guide Control Liqd Used to check glucometer for accurateness.   Accu-Chek Softclix Lancets lancets CHECK BLOOD SUGAR ONCE A DAY DX E11.9   acetaminophen  650 MG CR tablet Commonly known as: TYLENOL  Take 650 mg by mouth every 8 (eight) hours as needed for pain.   acetaminophen  500 MG tablet Commonly known as: TYLENOL  Take 500 mg by mouth every 6 (six) hours as needed.   aspirin 81 MG tablet Take 81 mg by mouth daily.   calcium -vitamin D  250-125 MG-UNIT tablet Commonly known as: OSCAL WITH D Take 1 tablet by mouth daily.   glipiZIDE  5 MG 24 hr tablet Commonly known as: GLUCOTROL  XL Take 1  tablet (5 mg total) by mouth daily.   ibuprofen  800 MG tablet Commonly known as: ADVIL  Take 1 tablet (800 mg total) by mouth every 8 (eight) hours as needed for up to 10 days for mild pain (pain score 1-3) or moderate pain (pain score 4-6).    Jardiance  25 MG Tabs tablet Generic drug: empagliflozin  TAKE 1 TABLET DAILY BEFORE BREAKFAST   lisinopril  10 MG tablet Commonly known as: ZESTRIL  TAKE 1 TABLET DAILY   multivitamin tablet Take 1 tablet by mouth daily.   omeprazole  20 MG capsule Commonly known as: PRILOSEC Take 1 capsule (20 mg total) by mouth daily.   Repatha  SureClick 140 MG/ML Soaj Generic drug: Evolocumab  INJECT 140 MG INTO THE SKIN EVERY 14 (FOURTEEN) DAYS. (PA DENIED)   traMADol  50 MG tablet Commonly known as: ULTRAM  Take 25-50 mg by mouth 2 (two) times daily as needed.          Follow-up Information     Tye, Isami, DO Follow up in 2 week(s).   Specialties: General Surgery, Surgery Why: 2 week follow-up ex lap for closed loop obstruction, remove staples (schedule on 10/6) Contact information: [address] [place] [address] [phone]                  Time spent on discharge management including discussion of hospital course, clinical condition, outpatient instructions, prescriptions, and follow up with the patient and members of the medical team: >30 minutes  Tysen Roesler Barrientos PA-C

## 2023-10-08 NOTE — Anesthesia Postprocedure Evaluation (Signed)
 Anesthesia Post Note  Patient: Wanda Ruiz  Procedure(s) Performed: LAPAROTOMY, EXPLORATORY (Abdomen)  Patient location during evaluation: PACU Anesthesia Type: General Level of consciousness: awake and alert Pain management: pain level controlled Vital Signs Assessment: post-procedure vital signs reviewed and stable Respiratory status: spontaneous breathing, nonlabored ventilation, respiratory function stable and patient connected to nasal cannula oxygen Cardiovascular status: blood pressure returned to baseline and stable Postop Assessment: no apparent nausea or vomiting Anesthetic complications: no   No notable events documented.   Last Vitals:  Vitals:   10/05/23 0319 10/05/23 0809  BP: 131/74 119/71  Pulse: 80 74  Resp: 16 18  Temp: 37.5 C 36.9 C  SpO2: 97% 96%    Last Pain:  Vitals:   10/05/23 0809  TempSrc: Oral  PainSc:                  [name] KANDICE Clause

## 2023-11-12 ENCOUNTER — Encounter: Payer: Self-pay | Admitting: Internal Medicine

## 2023-11-12 ENCOUNTER — Ambulatory Visit

## 2023-11-12 ENCOUNTER — Ambulatory Visit (INDEPENDENT_AMBULATORY_CARE_PROVIDER_SITE_OTHER): Admitting: Internal Medicine

## 2023-11-12 VITALS — BP 138/82 | HR 76 | Ht 63.0 in | Wt 126.4 lb

## 2023-11-12 DIAGNOSIS — Z7984 Long term (current) use of oral hypoglycemic drugs: Secondary | ICD-10-CM | POA: Diagnosis not present

## 2023-11-12 DIAGNOSIS — Z23 Encounter for immunization: Secondary | ICD-10-CM | POA: Diagnosis not present

## 2023-11-12 DIAGNOSIS — R809 Proteinuria, unspecified: Secondary | ICD-10-CM | POA: Diagnosis not present

## 2023-11-12 DIAGNOSIS — E1165 Type 2 diabetes mellitus with hyperglycemia: Secondary | ICD-10-CM

## 2023-11-12 DIAGNOSIS — E1129 Type 2 diabetes mellitus with other diabetic kidney complication: Secondary | ICD-10-CM

## 2023-11-12 NOTE — Progress Notes (Unsigned)
 Subjective:  [name], female    DOB: [birth year]  Age: 88 y.o. MRN: [id number]  CC: There were no encounter diagnoses.   HPI Wanda Ruiz presents for  Chief Complaint  Patient presents with   Medical Management of Chronic Issues    Follow up on type 2 DM managed with Jardiance  and glipizide . At last visit she was acutely ill,  was transferred to [facility] for hospitalization  Not tolerating jardiance  because it made her sciatica leg ache.  . Stopped the jardiance  6 to 8 weeks ago  and pain has improved .  The A1c was checked during her suspension of jardiance .   Has been taking glipizide  XR 5  mg fasting sugars have been < 130 ,  post prandials have been   < 165    She has  chronic sciatica  managed with tramadol  .   Lab Results  Component Value Date   HGBA1C 7.1 (H) 09/27/2023     Outpatient Medications Prior to Visit  Medication Sig Dispense Refill   ACCU-CHEK AVIVA PLUS test strip USE TO CHECK BLOOD SUGAR THREE TIMES A DAY 200 strip 12   ACCU-CHEK SOFTCLIX LANCETS lancets CHECK BLOOD SUGAR ONCE A DAY DX E11.9  11   acetaminophen  (TYLENOL ) 500 MG tablet Take 500 mg by mouth every 6 (six) hours as needed.     acetaminophen  (TYLENOL ) 650 MG CR tablet Take 650 mg by mouth every 8 (eight) hours as needed for pain.     aspirin 81 MG tablet Take 81 mg by mouth daily.     Blood Glucose Calibration (ACCU-CHEK GUIDE CONTROL) LIQD Used to check glucometer for accurateness. 1 each 0   calcium -vitamin D  (OSCAL WITH D) 250-125 MG-UNIT tablet Take 1 tablet by mouth daily.     Evolocumab  (REPATHA  SURECLICK) 140 MG/ML SOAJ INJECT 140 MG INTO THE SKIN EVERY 14 (FOURTEEN) DAYS. (PA DENIED) 6 mL 1   glipiZIDE  (GLUCOTROL  XL) 5 MG 24 hr tablet Take 1 tablet (5 mg total) by mouth daily. 90 tablet 3   JARDIANCE  25 MG TABS tablet TAKE 1 TABLET DAILY BEFORE BREAKFAST 90 tablet 3   lisinopril  (ZESTRIL ) 10 MG tablet TAKE 1 TABLET DAILY 90 tablet 3   Multiple Vitamin (MULTIVITAMIN)  tablet Take 1 tablet by mouth daily.     omeprazole  (PRILOSEC) 20 MG capsule Take 1 capsule (20 mg total) by mouth daily. 90 capsule 3   traMADol  (ULTRAM ) 50 MG tablet Take 25-50 mg by mouth 2 (two) times daily as needed.     No facility-administered medications prior to visit.    Review of Systems;  Patient denies headache, fevers, malaise, unintentional weight loss, skin rash, eye pain, sinus congestion and sinus pain, sore throat, dysphagia,  hemoptysis , cough, dyspnea, wheezing, chest pain, palpitations, orthopnea, edema, abdominal pain, nausea, melena, diarrhea, constipation, flank pain, dysuria, hematuria, urinary  Frequency, nocturia, numbness, tingling, seizures,  Focal weakness, Loss of consciousness,  Tremor, insomnia, depression, anxiety, and suicidal ideation.      Objective:  BP 138/82   Pulse 76   Ht 5' 3 (1.6 m)   Wt 126 lb 6.4 oz (57.3 kg)   SpO2 97%   BMI 22.39 kg/m   BP Readings from Last 3 Encounters:  11/12/23 138/82  10/05/23 119/71  10/01/23 120/72    Wt Readings from Last 3 Encounters:  11/12/23 126 lb 6.4 oz (57.3 kg)  10/01/23 124 lb 3.2 oz (56.3 kg)  07/03/23 130 lb (59 kg)  Physical Exam  Lab Results  Component Value Date   HGBA1C 7.1 (H) 09/27/2023   HGBA1C 6.7 (H) 05/29/2023   HGBA1C 6.9 (H) 01/25/2023    Lab Results  Component Value Date   CREATININE 0.70 10/05/2023   CREATININE 0.79 10/04/2023   CREATININE 0.94 10/03/2023    Lab Results  Component Value Date   WBC 8.1 10/05/2023   HGB 10.3 (L) 10/05/2023   HCT 32.2 (L) 10/05/2023   PLT 232 10/05/2023   GLUCOSE 92 10/05/2023   CHOL 152 09/27/2023   TRIG 55.0 09/27/2023   HDL 67.00 09/27/2023   LDLCALC 74 09/27/2023   ALT 13 10/01/2023   AST 28 10/01/2023   NA 141 10/05/2023   K 4.2 10/05/2023   CL 106 10/05/2023   CREATININE 0.70 10/05/2023   BUN 14 10/05/2023   CO2 22 10/05/2023   TSH 0.98 09/27/2023   HGBA1C 7.1 (H) 09/27/2023   MICROALBUR 2.2 (H) 09/27/2023     CT ABDOMEN PELVIS W CONTRAST Addendum Date: 10/01/2023 ADDENDUM REPORT: 10/01/2023 17:57 ADDENDUM: These results were called by telephone at the time of interpretation on 10/01/2023 at 4:56 pm to provider Dr. Jossie, who verbally acknowledged these results. Electronically Signed   By: Greig Pique M.D.   On: 10/01/2023 17:57   Result Date: 10/01/2023 CLINICAL DATA:  Acute abdominal pain. EXAM: CT ABDOMEN AND PELVIS WITH CONTRAST TECHNIQUE: Multidetector CT imaging of the abdomen and pelvis was performed using the standard protocol following bolus administration of intravenous contrast. RADIATION DOSE REDUCTION: This exam was performed according to the departmental dose-optimization program which includes automated exposure control, adjustment of the mA and/or kV according to patient size and/or use of iterative reconstruction technique. CONTRAST:  OMNIPAQUE  IOHEXOL  300 MG/ML  SOLN COMPARISON:  None Available. FINDINGS: Lower chest: No acute abnormality. Hepatobiliary: No focal liver abnormality is seen. No gallstones, gallbladder wall thickening, or biliary dilatation. Pancreas: Unremarkable. No pancreatic ductal dilatation or surrounding inflammatory changes. Spleen: Normal in size without focal abnormality. Adrenals/Urinary Tract: There is a punctate nonobstructing left renal calculus. There are rounded hypodensities in the kidneys which are too small to characterize, likely cysts. There is no hydronephrosis or perinephric stranding. The adrenal glands are within normal limits. The bladder is distended, but otherwise within normal limits. Stomach/Bowel: There is a moderate air-fluid level in the stomach. There is a loop of bowel in the left lower quadrant with mesenteric edema and proximal/distal transition point in the same location on coronal image 5/30. Findings are worrisome for internal hernia with closed loop obstruction. Additionally, small-bowel loops proximal to this level are dilated  measuring up to 3.2 cm with some air-fluid levels. There is no pneumatosis or free air. Colon and stomach are within normal limits. Vascular/Lymphatic: Aortic atherosclerosis. No enlarged abdominal or pelvic lymph nodes. Reproductive: Multiple uterine fibroids are present measuring up to 3.7 cm. Adnexa are within normal limits. Other: There is trace free fluid in the right upper quadrant and pelvis. There is no abdominal wall hernia. Musculoskeletal: There is mild chronic compression deformity of L2. Multilevel degenerative changes affect the spine. No acute fractures are seen. IMPRESSION: 1. Findings worrisome for left lower quadrant internal hernia with closed loop small-bowel obstruction. Mesenteric edema is present which can be seen with vascular compromise/strangulated or incarcerated hernia. No pneumatosis or free air. Surgical consultation recommended. 2. Trace free fluid in the abdomen and pelvis. 3. Nonobstructing left renal calculus. 4. Uterine fibroids. 5. Aortic atherosclerosis. Aortic Atherosclerosis (ICD10-I70.0). Electronically Signed: By: Amy  Maple M.D. On: 10/01/2023 16:49    Assessment & Plan:  .There are no diagnoses linked to this encounter.   I spent 34 minutes on the day of this face to face encounter reviewing patient's  most recent visit with cardiology,  nephrology,  and neurology,  prior relevant surgical and non surgical procedures, recent  labs and imaging studies, counseling on weight management,  reviewing the assessment and plan with patient, and post visit ordering and reviewing of  diagnostics and therapeutics with patient  .   Follow-up: No follow-ups on file.   Verneita LITTIE Kettering, MD

## 2023-11-12 NOTE — Patient Instructions (Addendum)
 Fasting sugars are those that are done  first thing in the morning  upon waking,  and  should be 80 to 130.  Post prandial  sugars should 160 or less  (2 hours after a meal )   Continue glipizide  only for now  for blood sugar control   Your kidney function is fine.  Better than usual   Please Return for repeat A1c and other labs  dec 19 or later (not before)

## 2023-11-13 ENCOUNTER — Encounter: Payer: Self-pay | Admitting: Internal Medicine

## 2023-11-13 DIAGNOSIS — E1129 Type 2 diabetes mellitus with other diabetic kidney complication: Secondary | ICD-10-CM | POA: Insufficient documentation

## 2023-11-13 NOTE — Assessment & Plan Note (Signed)
 UaCr remains < 30.  Continue ACE Inhibitor.  Did not tolerate Jardiance 

## 2023-11-13 NOTE — Assessment & Plan Note (Signed)
 She did not tolerate Jardiance  due to aggravation of sciatica pain which she states resolved with suspension.  Reviewed home BS readings and more recent  A1c.  No need to intensify regimen.  Continue glipizide  XL 5 mg daily for now   Lab Results  Component Value Date   HGBA1C 7.1 (H) 09/27/2023

## 2023-12-19 ENCOUNTER — Other Ambulatory Visit: Payer: Self-pay | Admitting: Internal Medicine

## 2023-12-25 ENCOUNTER — Other Ambulatory Visit: Payer: Self-pay | Admitting: Internal Medicine

## 2024-01-11 ENCOUNTER — Other Ambulatory Visit: Payer: Self-pay | Admitting: Internal Medicine

## 2024-01-14 ENCOUNTER — Other Ambulatory Visit: Payer: Self-pay | Admitting: Internal Medicine

## 2024-01-15 ENCOUNTER — Other Ambulatory Visit (HOSPITAL_COMMUNITY): Payer: Self-pay

## 2024-01-15 ENCOUNTER — Telehealth: Payer: Self-pay

## 2024-01-15 NOTE — Telephone Encounter (Signed)
 Pharmacy Patient Advocate Encounter   Received notification from [employer] that prior authorization for Evolocumab  (REPATHA  SURECLICK) 140 MG/ML SOAJ  is required/requested.   Insurance verification completed.   The patient is insured through [employer] [facility] MEDICARE.   Per test claim: PA required; PA submitted to above mentioned insurance via Latent Key/confirmation #/EOC [id number] Status is pending

## 2024-01-17 NOTE — Telephone Encounter (Signed)
 Pharmacy Patient Advocate Encounter  Received notification from [facility] that Prior Authorization for Repatha  SureClick 140MG /ML auto-injectors has been APPROVED from 01/16/2024 to 01/15/2025   PA #/Case ID/Reference #: [id number]

## 2024-01-28 ENCOUNTER — Other Ambulatory Visit

## 2024-01-28 DIAGNOSIS — E1165 Type 2 diabetes mellitus with hyperglycemia: Secondary | ICD-10-CM

## 2024-01-28 LAB — COMPREHENSIVE METABOLIC PANEL WITH GFR
ALT: 33 U/L (ref 3–35)
AST: 21 U/L (ref 5–37)
Albumin: 4.2 g/dL (ref 3.5–5.2)
Alkaline Phosphatase: 41 U/L (ref 39–117)
BUN: 19 mg/dL (ref 6–23)
CO2: 27 meq/L (ref 19–32)
Calcium: 9.4 mg/dL (ref 8.4–10.5)
Chloride: 104 meq/L (ref 96–112)
Creatinine, Ser: 0.85 mg/dL (ref 0.40–1.20)
GFR: 61.86 mL/min
Glucose, Bld: 94 mg/dL (ref 70–99)
Potassium: 4.4 meq/L (ref 3.5–5.1)
Sodium: 140 meq/L (ref 135–145)
Total Bilirubin: 0.5 mg/dL (ref 0.2–1.2)
Total Protein: 7.3 g/dL (ref 6.0–8.3)

## 2024-01-30 ENCOUNTER — Ambulatory Visit: Payer: Self-pay | Admitting: Internal Medicine

## 2024-01-30 ENCOUNTER — Ambulatory Visit: Admitting: Internal Medicine

## 2024-01-30 ENCOUNTER — Encounter: Payer: Self-pay | Admitting: Internal Medicine

## 2024-01-30 VITALS — BP 126/72 | HR 77 | Temp 98.1°F | Ht 63.0 in | Wt 125.4 lb

## 2024-01-30 DIAGNOSIS — G720 Drug-induced myopathy: Secondary | ICD-10-CM | POA: Diagnosis not present

## 2024-01-30 DIAGNOSIS — D649 Anemia, unspecified: Secondary | ICD-10-CM

## 2024-01-30 DIAGNOSIS — T466X5A Adverse effect of antihyperlipidemic and antiarteriosclerotic drugs, initial encounter: Secondary | ICD-10-CM

## 2024-01-30 DIAGNOSIS — E039 Hypothyroidism, unspecified: Secondary | ICD-10-CM | POA: Diagnosis not present

## 2024-01-30 DIAGNOSIS — E1129 Type 2 diabetes mellitus with other diabetic kidney complication: Secondary | ICD-10-CM | POA: Diagnosis not present

## 2024-01-30 DIAGNOSIS — I10 Essential (primary) hypertension: Secondary | ICD-10-CM | POA: Diagnosis not present

## 2024-01-30 DIAGNOSIS — E1165 Type 2 diabetes mellitus with hyperglycemia: Secondary | ICD-10-CM | POA: Diagnosis not present

## 2024-01-30 DIAGNOSIS — R809 Proteinuria, unspecified: Secondary | ICD-10-CM

## 2024-01-30 DIAGNOSIS — Z7984 Long term (current) use of oral hypoglycemic drugs: Secondary | ICD-10-CM | POA: Diagnosis not present

## 2024-01-30 DIAGNOSIS — E7800 Pure hypercholesterolemia, unspecified: Secondary | ICD-10-CM | POA: Diagnosis not present

## 2024-01-30 LAB — HM DIABETES FOOT EXAM

## 2024-01-30 MED ORDER — REPATHA SURECLICK 140 MG/ML ~~LOC~~ SOAJ: SUBCUTANEOUS | 1 refills | Status: AC

## 2024-01-30 NOTE — Progress Notes (Signed)
 "  Subjective:    [name], female    DOB: Oct 08, 1935, 89 y.o.   MRN: [id number]  Patient here for  Chief Complaint  Patient presents with   Medical Management of Chronic Issues    HPI Here for a scheduled follow up - follow up regarding diabetes, hypertension and hypercholesterolemia. Admitted 10/01/23 - 10/05/23 - CT - small bowel obstruction, mesenteric edema concerning for strangulated or incarcerated hernia. S/p explatory laparotomy, lysis of adhesions. Had f/u with endocrinology - multinodular goiter. Ultrasound - stable bilateral thyroid  nodules. Off jardiance  - 09/2023. No chest pain. Breathing stable. No cough or congestion. No abdominal pain. Bowels are moving. Reports 90 day average 114.    Past Medical History:  Diagnosis Date   Anemia    Arthritis    Cataract    Cerumen impaction 04/05/2019   Compression fracture of body of thoracic vertebra (HCC) 12/26/2020   Diabetes mellitus (HCC)    GERD (gastroesophageal reflux disease)    Hypercholesterolemia    Hypertension    Hypothyroidism    Neuromuscular disorder (HCC)    Osteoporosis    Past Surgical History:  Procedure Laterality Date   COLONOSCOPY     COLONOSCOPY WITH PROPOFOL  N/A 11/09/2017   Procedure: COLONOSCOPY WITH PROPOFOL ;  Surgeon: Gaylyn Gladis PENNER, MD;  Location: [facility] ENDOSCOPY;  Service: Endoscopy;  Laterality: N/A;   CYST EXCISION     low back   ESOPHAGOGASTRODUODENOSCOPY (EGD) WITH PROPOFOL  N/A 11/09/2017   Procedure: ESOPHAGOGASTRODUODENOSCOPY (EGD) WITH PROPOFOL ;  Surgeon: Gaylyn Gladis PENNER, MD;  Location: [facility] ENDOSCOPY;  Service: Endoscopy;  Laterality: N/A;   LAPAROTOMY N/A 10/01/2023   Procedure: LAPAROTOMY, EXPLORATORY;  Surgeon: Tye Millet, DO;  Location: [facility] ORS;  Service: General;  Laterality: N/A;   THYROID  SURGERY  1992   left hemi-thyroidectomy (colloid goiter0   UPPER GASTROINTESTINAL ENDOSCOPY     Family History  Problem Relation Age of Onset   Emphysema Father     Heart disease Mother        myocardial infarction (15)   Hypertension Mother    Diabetes Mother    Heart disease Brother        drug use   Diabetes Mellitus II Sister        x3   Hypertension Sister    Breast cancer Other        niece   Breast cancer Sister    Cancer Sister    Heart disease Brother    Hypertension Sister    Colon cancer Neg Hx    Social History   Socioeconomic History   Marital status: Married    Spouse name: Not on file   Number of children: 2   Years of education: Not on file   Highest education level: 12th grade  Occupational History   Not on file  Tobacco Use   Smoking status: Never   Smokeless tobacco: Never  Vaping Use   Vaping status: Never Used  Substance and Sexual Activity   Alcohol use: No    Alcohol/week: 0.0 standard drinks of alcohol    Comment: occasional   Drug use: Never   Sexual activity: Not on file  Other Topics Concern   Not on file  Social History Narrative   In [place]; with husband; never smoked;  no alcohol; telephone receptionist.    Social Drivers of Health   Tobacco Use: Low Risk (02/03/2024)   Patient History    Smoking Tobacco Use: Never    Smokeless Tobacco Use:  Never    Passive Exposure: Not on file  Financial Resource Strain: Low Risk  (10/29/2023)   Received from [facility]   Overall Financial Resource Strain (CARDIA)    Difficulty of Paying Living Expenses: Not hard at all  Food Insecurity: No Food Insecurity (10/29/2023)   Received from [facility]   Epic    Within the past 12 months, you worried that your food would run out before you got the money to buy more.: Never true    Within the past 12 months, the food you bought just didn't last and you didn't have money to get more.: Never true  Transportation Needs: No Transportation Needs (10/29/2023)   Received from [facility] - Transportation    In the past 12 months, has lack of  transportation kept you from medical appointments or from getting medications?: No    Lack of Transportation (Non-Medical): No  Physical Activity: Insufficiently Active (07/02/2023)   Exercise Vital Sign    Days of Exercise per Week: 3 days    Minutes of Exercise per Session: 10 min  Stress: No Stress Concern Present (07/02/2023)   [employer] - Occupational Stress Questionnaire    Feeling of Stress: Not at all  Social Connections: Socially Integrated (10/01/2023)   Social Connection and Isolation Panel    Frequency of Communication with Friends and Family: More than three times a week    Frequency of Social Gatherings with Friends and Family: Twice a week    Attends Religious Services: More than 4 times per year    Active Member of Clubs or Organizations: Yes    Attends Banker Meetings: 1 to 4 times per year    Marital Status: Married  Depression (PHQ2-9): Low Risk (11/12/2023)   Depression (PHQ2-9)    PHQ-2 Score: 0  Alcohol Screen: Low Risk (07/02/2023)   Alcohol Screen    Last Alcohol Screening Score (AUDIT): 0  Housing: Unknown (10/29/2023)   Received from [facility]   Epic    Unable to Pay for Housing in the Last Year: Not on file    In the past 12 months, how many times have you moved where you were living?: 0    At any time in the past 12 months, were you homeless or living in a shelter (including now)?: No  Utilities: Not At Risk (10/29/2023)   Received from [facility]   Epic    In the past 12 months has the electric, gas, oil, or water company threatened to shut off services in your home?: No  Health Literacy: Adequate Health Literacy (07/03/2023)   B1300 Health Literacy    Frequency of need for help with medical instructions: Never     Review of Systems  Constitutional:  Negative for appetite change and unexpected weight change.  HENT:  Negative for congestion and sinus pressure.    Respiratory:  Negative for cough, chest tightness and shortness of breath.   Cardiovascular:  Negative for chest pain, palpitations and leg swelling.  Gastrointestinal:  Negative for abdominal pain, diarrhea, nausea and vomiting.  Genitourinary:  Negative for difficulty urinating and dysuria.  Musculoskeletal:  Negative for joint swelling and myalgias.  Skin:  Negative for color change and rash.  Neurological:  Negative for dizziness and headaches.  Psychiatric/Behavioral:  Negative for agitation and dysphoric mood.        Objective:     BP  126/72   Pulse 77   Temp 98.1 F (36.7 C) (Oral)   Ht 5' 3 (1.6 m)   Wt 125 lb 6.4 oz (56.9 kg)   SpO2 97%   BMI 22.21 kg/m  Wt Readings from Last 3 Encounters:  01/30/24 125 lb 6.4 oz (56.9 kg)  11/12/23 126 lb 6.4 oz (57.3 kg)  10/01/23 124 lb 3.2 oz (56.3 kg)    Physical Exam Vitals reviewed.  Constitutional:      General: She is not in acute distress.    Appearance: Normal appearance.  HENT:     Head: Normocephalic and atraumatic.     Right Ear: External ear normal.     Left Ear: External ear normal.     Mouth/Throat:     Pharynx: No oropharyngeal exudate or posterior oropharyngeal erythema.  Eyes:     General: No scleral icterus.       Right eye: No discharge.        Left eye: No discharge.     Conjunctiva/sclera: Conjunctivae normal.  Neck:     Thyroid : No thyromegaly.  Cardiovascular:     Rate and Rhythm: Normal rate and regular rhythm.  Pulmonary:     Effort: No respiratory distress.     Breath sounds: Normal breath sounds. No wheezing.  Abdominal:     General: Bowel sounds are normal.     Palpations: Abdomen is soft.     Tenderness: There is no abdominal tenderness.  Musculoskeletal:        General: No swelling or tenderness.     Cervical back: Neck supple. No tenderness.  Lymphadenopathy:     Cervical: No cervical adenopathy.  Skin:    Findings: No erythema or rash.  Neurological:     Mental Status: She  is alert.  Psychiatric:        Mood and Affect: Mood normal.        Behavior: Behavior normal.         Outpatient Encounter Medications as of 01/30/2024  Medication Sig   ACCU-CHEK AVIVA PLUS test strip USE TO CHECK BLOOD SUGAR THREE TIMES A DAY   ACCU-CHEK SOFTCLIX LANCETS lancets CHECK BLOOD SUGAR ONCE A DAY DX E11.9   acetaminophen  (TYLENOL ) 500 MG tablet Take 500 mg by mouth every 6 (six) hours as needed.   aspirin 81 MG tablet Take 81 mg by mouth daily.   Blood Glucose Calibration (ACCU-CHEK GUIDE CONTROL) LIQD Used to check glucometer for accurateness.   calcium -vitamin D  (OSCAL WITH D) 250-125 MG-UNIT tablet Take 1 tablet by mouth daily.   glipiZIDE  (GLUCOTROL  XL) 5 MG 24 hr tablet TAKE 1 TABLET DAILY   lisinopril  (ZESTRIL ) 10 MG tablet TAKE 1 TABLET DAILY   Multiple Vitamin (MULTIVITAMIN) tablet Take 1 tablet by mouth daily.   omeprazole  (PRILOSEC) 20 MG capsule Take 1 capsule (20 mg total) by mouth daily.   traMADol  (ULTRAM ) 50 MG tablet Take 25-50 mg by mouth 2 (two) times daily as needed.   Evolocumab  (REPATHA  SURECLICK) 140 MG/ML SOAJ INJECT 140 MG INTO THE SKIN EVERY 14 (FOURTEEN) DAYS. (PA DENIED)   [DISCONTINUED] acetaminophen  (TYLENOL ) 650 MG CR tablet Take 650 mg by mouth every 8 (eight) hours as needed for pain.   [DISCONTINUED] Evolocumab  (REPATHA  SURECLICK) 140 MG/ML SOAJ INJECT 140 MG INTO THE SKIN EVERY 14 (FOURTEEN) DAYS. (PA DENIED)   No facility-administered encounter medications on file as of 01/30/2024.     Lab Results  Component Value Date   WBC 7.3 01/30/2024  HGB 11.5 (L) 01/30/2024   HCT 35.8 (L) 01/30/2024   PLT 220.0 01/30/2024   GLUCOSE 94 01/28/2024   CHOL 142 01/30/2024   TRIG 159.0 (H) 01/30/2024   HDL 56.40 01/30/2024   LDLCALC 54 01/30/2024   ALT 33 01/28/2024   AST 21 01/28/2024   NA 140 01/28/2024   K 4.4 01/28/2024   CL 104 01/28/2024   CREATININE 0.85 01/28/2024   BUN 19 01/28/2024   CO2 27 01/28/2024   TSH 0.98 09/27/2023    HGBA1C 6.5 01/30/2024   MICROALBUR 2.2 (H) 09/27/2023    CT ABDOMEN PELVIS W CONTRAST Addendum Date: 10/01/2023 ADDENDUM REPORT: 10/01/2023 17:57 ADDENDUM: These results were called by telephone at the time of interpretation on 10/01/2023 at 4:56 pm to provider Dr. Jossie, who verbally acknowledged these results. Electronically Signed   By: Greig Pique M.D.   On: 10/01/2023 17:57   Result Date: 10/01/2023 CLINICAL DATA:  Acute abdominal pain. EXAM: CT ABDOMEN AND PELVIS WITH CONTRAST TECHNIQUE: Multidetector CT imaging of the abdomen and pelvis was performed using the standard protocol following bolus administration of intravenous contrast. RADIATION DOSE REDUCTION: This exam was performed according to the departmental dose-optimization program which includes automated exposure control, adjustment of the mA and/or kV according to patient size and/or use of iterative reconstruction technique. CONTRAST:  100mL OMNIPAQUE  IOHEXOL  300 MG/ML  SOLN COMPARISON:  None Available. FINDINGS: Lower chest: No acute abnormality. Hepatobiliary: No focal liver abnormality is seen. No gallstones, gallbladder wall thickening, or biliary dilatation. Pancreas: Unremarkable. No pancreatic ductal dilatation or surrounding inflammatory changes. Spleen: Normal in size without focal abnormality. Adrenals/Urinary Tract: There is a punctate nonobstructing left renal calculus. There are rounded hypodensities in the kidneys which are too small to characterize, likely cysts. There is no hydronephrosis or perinephric stranding. The adrenal glands are within normal limits. The bladder is distended, but otherwise within normal limits. Stomach/Bowel: There is a moderate air-fluid level in the stomach. There is a loop of bowel in the left lower quadrant with mesenteric edema and proximal/distal transition point in the same location on coronal image 5/30. Findings are worrisome for internal hernia with closed loop obstruction. Additionally,  small-bowel loops proximal to this level are dilated measuring up to 3.2 cm with some air-fluid levels. There is no pneumatosis or free air. Colon and stomach are within normal limits. Vascular/Lymphatic: Aortic atherosclerosis. No enlarged abdominal or pelvic lymph nodes. Reproductive: Multiple uterine fibroids are present measuring up to 3.7 cm. Adnexa are within normal limits. Other: There is trace free fluid in the right upper quadrant and pelvis. There is no abdominal wall hernia. Musculoskeletal: There is mild chronic compression deformity of L2. Multilevel degenerative changes affect the spine. No acute fractures are seen. IMPRESSION: 1. Findings worrisome for left lower quadrant internal hernia with closed loop small-bowel obstruction. Mesenteric edema is present which can be seen with vascular compromise/strangulated or incarcerated hernia. No pneumatosis or free air. Surgical consultation recommended. 2. Trace free fluid in the abdomen and pelvis. 3. Nonobstructing left renal calculus. 4. Uterine fibroids. 5. Aortic atherosclerosis. Aortic Atherosclerosis (ICD10-I70.0). Electronically Signed: By: Greig Pique M.D. On: 10/01/2023 16:49       Assessment & Plan:  Anemia, unspecified type Assessment & Plan: Hgb decreased - last check. Recheck cbc today.   Orders: -     CBC with Differential/Platelet -     Vitamin B12 -     IBC + Ferritin  Hypothyroidism, unspecified type Assessment & Plan: Evaluated by Dr Damian.  Had f/u with endocrinology - multinodular goiter. Ultrasound - stable bilateral thyroid  nodules.    Hypercholesterolemia Assessment & Plan: Intolerant to statin medication.  On repatha . Cholesterol as outlined.  Low cholesterol diet and exercise. Check lipid panel.  Lab Results  Component Value Date   CHOL 142 01/30/2024   HDL 56.40 01/30/2024   LDLCALC 54 01/30/2024   TRIG 159.0 (H) 01/30/2024   CHOLHDL 3 01/30/2024     Orders: -     Lipid panel  Type 2 diabetes  mellitus with hyperglycemia, without long-term current use of insulin  (HCC) Assessment & Plan: Off jardiance . Continues on glipizide . Low carb diet and exercise. Follow met b and A1c.   Lab Results  Component Value Date   HGBA1C 6.5 01/30/2024     Orders: -     Hemoglobin A1c  Primary hypertension Assessment & Plan: Continue lisinopril . Follow pressures. Follow metabolic panel.    Microalbuminuria due to type 2 diabetes mellitus (HCC) Assessment & Plan: Continue lisinopril . Off jardiance .    Statin myopathy Assessment & Plan: Intolerant to statin medication.  On repatha .    Other orders -     Repatha  SureClick; INJECT 140 MG INTO THE SKIN EVERY 14 (FOURTEEN) DAYS. (PA DENIED)  Dispense: 6 mL; Refill: 1     Allena Hamilton, MD "

## 2024-01-31 LAB — IBC + FERRITIN
Ferritin: 163.7 ng/mL (ref 10.0–291.0)
Iron: 40 ug/dL — ABNORMAL LOW (ref 42–145)
Saturation Ratios: 13.1 % — ABNORMAL LOW (ref 20.0–50.0)
TIBC: 305.2 ug/dL (ref 250.0–450.0)
Transferrin: 218 mg/dL (ref 212.0–360.0)

## 2024-01-31 LAB — LIPID PANEL
Cholesterol: 142 mg/dL (ref 28–200)
HDL: 56.4 mg/dL
LDL Cholesterol: 54 mg/dL (ref 10–99)
NonHDL: 85.34
Total CHOL/HDL Ratio: 3
Triglycerides: 159 mg/dL — ABNORMAL HIGH (ref 10.0–149.0)
VLDL: 31.8 mg/dL (ref 0.0–40.0)

## 2024-01-31 LAB — CBC WITH DIFFERENTIAL/PLATELET
Basophils Absolute: 0.1 K/uL (ref 0.0–0.1)
Basophils Relative: 0.9 % (ref 0.0–3.0)
Eosinophils Absolute: 0.1 K/uL (ref 0.0–0.7)
Eosinophils Relative: 1.9 % (ref 0.0–5.0)
HCT: 35.8 % — ABNORMAL LOW (ref 36.0–46.0)
Hemoglobin: 11.5 g/dL — ABNORMAL LOW (ref 12.0–15.0)
Lymphocytes Relative: 26.4 % (ref 12.0–46.0)
Lymphs Abs: 1.9 K/uL (ref 0.7–4.0)
MCHC: 32.2 g/dL (ref 30.0–36.0)
MCV: 78.3 fl (ref 78.0–100.0)
Monocytes Absolute: 0.6 K/uL (ref 0.1–1.0)
Monocytes Relative: 7.6 % (ref 3.0–12.0)
Neutro Abs: 4.6 K/uL (ref 1.4–7.7)
Neutrophils Relative %: 63.2 % (ref 43.0–77.0)
Platelets: 220 K/uL (ref 150.0–400.0)
RBC: 4.57 Mil/uL (ref 3.87–5.11)
RDW: 17.3 % — ABNORMAL HIGH (ref 11.5–15.5)
WBC: 7.3 K/uL (ref 4.0–10.5)

## 2024-01-31 LAB — VITAMIN B12: Vitamin B-12: 985 pg/mL — ABNORMAL HIGH (ref 211–911)

## 2024-01-31 LAB — HEMOGLOBIN A1C: Hgb A1c MFr Bld: 6.5 % (ref 4.6–6.5)

## 2024-02-01 ENCOUNTER — Ambulatory Visit: Payer: Self-pay | Admitting: Internal Medicine

## 2024-02-01 ENCOUNTER — Ambulatory Visit: Admitting: Internal Medicine

## 2024-02-03 ENCOUNTER — Encounter: Payer: Self-pay | Admitting: Internal Medicine

## 2024-02-03 NOTE — Assessment & Plan Note (Signed)
 Intolerant to statin medication.  On repatha . Cholesterol as outlined.  Low cholesterol diet and exercise. Check lipid panel.  Lab Results  Component Value Date   CHOL 142 01/30/2024   HDL 56.40 01/30/2024   LDLCALC 54 01/30/2024   TRIG 159.0 (H) 01/30/2024   CHOLHDL 3 01/30/2024

## 2024-02-03 NOTE — Assessment & Plan Note (Signed)
Intolerant to statin medication.  On repatha.

## 2024-02-03 NOTE — Assessment & Plan Note (Signed)
 Continue lisinopril . Off jardiance .

## 2024-02-03 NOTE — Assessment & Plan Note (Signed)
 Hgb decreased - last check. Recheck cbc today.

## 2024-02-03 NOTE — Assessment & Plan Note (Signed)
 Off jardiance . Continues on glipizide . Low carb diet and exercise. Follow met b and A1c.   Lab Results  Component Value Date   HGBA1C 6.5 01/30/2024

## 2024-02-03 NOTE — Assessment & Plan Note (Signed)
 Continue lisinopril.  Follow pressures.  Follow metabolic panel.

## 2024-02-03 NOTE — Assessment & Plan Note (Signed)
 Evaluated by Dr Damian.  Had f/u with endocrinology - multinodular goiter. Ultrasound - stable bilateral thyroid  nodules.

## 2024-05-05 ENCOUNTER — Encounter: Admitting: Internal Medicine

## 2024-07-04 ENCOUNTER — Ambulatory Visit
